# Patient Record
Sex: Male | Born: 1970 | Race: White | Hispanic: No | Marital: Married | Smoking: Former smoker
Health system: Southern US, Community
[De-identification: ages and names within clinical notes are randomized; demographics above are authoritative.]

## PROBLEM LIST (undated history)

## (undated) DIAGNOSIS — E785 Hyperlipidemia, unspecified: Secondary | ICD-10-CM

## (undated) DIAGNOSIS — K8020 Calculus of gallbladder without cholecystitis without obstruction: Secondary | ICD-10-CM

## (undated) DIAGNOSIS — I10 Essential (primary) hypertension: Secondary | ICD-10-CM

## (undated) DIAGNOSIS — E119 Type 2 diabetes mellitus without complications: Secondary | ICD-10-CM

## (undated) DIAGNOSIS — I429 Cardiomyopathy, unspecified: Secondary | ICD-10-CM

## (undated) HISTORY — DX: Hyperlipidemia, unspecified: E78.5

## (undated) HISTORY — DX: Cardiomyopathy, unspecified: I42.9

---

## 1998-01-28 ENCOUNTER — Emergency Department (HOSPITAL_COMMUNITY): Admission: EM | Admit: 1998-01-28 | Discharge: 1998-01-28 | Payer: Self-pay | Admitting: Emergency Medicine

## 1999-10-20 ENCOUNTER — Encounter: Payer: Self-pay | Admitting: Emergency Medicine

## 1999-10-20 ENCOUNTER — Emergency Department (HOSPITAL_COMMUNITY): Admission: EM | Admit: 1999-10-20 | Discharge: 1999-10-20 | Payer: Self-pay | Admitting: Emergency Medicine

## 2011-12-03 ENCOUNTER — Emergency Department (HOSPITAL_COMMUNITY): Payer: BC Managed Care – PPO

## 2011-12-03 ENCOUNTER — Emergency Department (HOSPITAL_COMMUNITY)
Admission: EM | Admit: 2011-12-03 | Discharge: 2011-12-03 | Disposition: A | Discharge status: Home / Self Care | Payer: BC Managed Care – PPO | Attending: Emergency Medicine | Admitting: Emergency Medicine

## 2011-12-03 ENCOUNTER — Encounter (HOSPITAL_COMMUNITY): Payer: Self-pay | Admitting: Adult Health

## 2011-12-03 DIAGNOSIS — F17200 Nicotine dependence, unspecified, uncomplicated: Secondary | ICD-10-CM | POA: Insufficient documentation

## 2011-12-03 DIAGNOSIS — X58XXXA Exposure to other specified factors, initial encounter: Secondary | ICD-10-CM | POA: Insufficient documentation

## 2011-12-03 DIAGNOSIS — I10 Essential (primary) hypertension: Secondary | ICD-10-CM | POA: Insufficient documentation

## 2011-12-03 DIAGNOSIS — Y9364 Activity, baseball: Secondary | ICD-10-CM | POA: Insufficient documentation

## 2011-12-03 DIAGNOSIS — S93609A Unspecified sprain of unspecified foot, initial encounter: Secondary | ICD-10-CM

## 2011-12-03 DIAGNOSIS — Y998 Other external cause status: Secondary | ICD-10-CM | POA: Insufficient documentation

## 2011-12-03 HISTORY — DX: Essential (primary) hypertension: I10

## 2011-12-03 MED ORDER — HYDROCODONE-ACETAMINOPHEN 5-325 MG PO TABS: 1.0000 | ORAL_TABLET | ORAL | Status: AC | PRN

## 2011-12-03 NOTE — ED Notes (Addendum)
C/o left heel pain that began after running from second to third base while playing softball this evening. States, "I heard a pop and then I started limping and my heel starting hurting" CMS intact.  Pain is worse when stepping on foot and is better when foot is off the ground

## 2011-12-03 NOTE — ED Provider Notes (Signed)
History     CSN: [id number]  Arrival date & time 12/03/11  2135   First MD Initiated Contact with Patient 12/03/11 2246      Chief Complaint  Patient presents with  . Foot Pain   HPI  History provided by the patient. Patient is a 41 year old male with history of hypertension who presents with complaints of left foot pain and injury. Patient states he was playing a softball game and was running from second base to third base when he felt a pop in his left foot area. Since that time he has pain in the heel is worse with any pressure or bearing weight. Patient does mention having some chronic pains to bilateral heels. Pains are usually worse in the morning and sometimes improve during the day. He has never had any other significant injuries to the feet. He has not used any medications to treat his symptoms. He denies any swelling of the foot or ankle. He denies any numbness or weakness.    Past Medical History  Diagnosis Date  . Hypertension     History reviewed. No pertinent past surgical history.  History reviewed. No pertinent family history.  History  Substance Use Topics  . Smoking status: Current Everyday Smoker  . Smokeless tobacco: Not on file  . Alcohol Use: Yes      Review of Systems  Musculoskeletal:       Left heel pain  Neurological: Negative for weakness and numbness.    Allergies  Review of patient's allergies indicates no known allergies.  Home Medications   Current Outpatient Rx  Name Route Sig Dispense Refill  . IBUPROFEN 200 MG PO TABS Oral Take 600 mg by mouth daily as needed. For pain      BP 150/98  Pulse 98  Temp 98.7 F (37.1 C) (Oral)  Resp 16  SpO2 96%  Physical Exam  Nursing note and vitals reviewed. Constitutional: He is oriented to person, place, and time. He appears well-developed and well-nourished. No distress.  HENT:  Head: Normocephalic.  Cardiovascular: Normal rate and regular rhythm.   Pulmonary/Chest: Effort normal and  breath sounds normal.  Musculoskeletal:       Left foot: He exhibits tenderness. He exhibits no swelling and no deformity.       Feet:       Tenderness around the left heel. No swelling or deformity. No tenderness over the lateral or medial malleolus. No tenderness over metatarsal. Normal dorsal pedal pulses. Normal cap refill in toes. Normal movement of toes. Normal sensation in toes and foot.  Neurological: He is alert and oriented to person, place, and time.  Skin: Skin is warm and dry. No erythema.  Psychiatric: He has a normal mood and affect. His behavior is normal.    ED Course  Procedures   Dg Foot Complete Left  12/03/2011  *RADIOLOGY REPORT*  Clinical Data: Left foot pain status post twisting injury  LEFT FOOT - COMPLETE 3+ VIEW  Comparison: None.  Findings: Intact Lisfranc joint.  No acute fracture dislocation identified.  Plantar calcaneal enthesopathic change  IMPRESSION: No acute osseous abnormality left foot.  If clinical concern for a fracture persists, recommend a repeat radiograph in 5-10 days to evaluate for interval change or callus formation.   Original Report Authenticated By: Waneta Martins, M.D.      1. Foot sprain       MDM  10:30PM patient seen and evaluated. X-rays without any concerning findings.  Patient initially with slight  tachycardia and hypertension. This has improved.  Patient is advised of x-ray findings. We'll provide crutches and ASO. Patient advised to have a repeat evaluation of his foot in 5-10 days.  Patient given prescription for Hebert [place], [place] 12/03/11 2336

## 2011-12-04 NOTE — ED Provider Notes (Signed)
Medical screening examination/treatment/procedure(s) were performed by non-physician practitioner and as supervising physician I was immediately available for consultation/collaboration.  Jones Skene, M.D.     Jones Skene, MD 12/04/11 [phone]

## 2015-02-09 ENCOUNTER — Emergency Department (INDEPENDENT_AMBULATORY_CARE_PROVIDER_SITE_OTHER)
Admission: EM | Admit: 2015-02-09 | Discharge: 2015-02-09 | Disposition: A | Discharge status: Home / Self Care | Payer: BLUE CROSS/BLUE SHIELD | Source: Home / Self Care | Attending: Internal Medicine | Admitting: Internal Medicine

## 2015-02-09 ENCOUNTER — Encounter (HOSPITAL_COMMUNITY): Payer: Self-pay | Admitting: Emergency Medicine

## 2015-02-09 DIAGNOSIS — T162XXA Foreign body in left ear, initial encounter: Secondary | ICD-10-CM | POA: Diagnosis not present

## 2015-02-09 NOTE — ED Provider Notes (Signed)
CSN: [id number]     Arrival date & time 02/09/15  1937 History   First MD Initiated Contact with Patient 02/09/15 2041     Chief Complaint  Patient presents with  . Foreign Body in Ear   (Consider location/radiation/quality/duration/timing/severity/associated sxs/prior Treatment) HPI Comments: 44 year old male that had a loosening device in his left ear this afternoon and when he removed it a portion of the device remained in the left ear canal. He has no complaints of hearing or pain.   Past Medical History  Diagnosis Date  . Hypertension    History reviewed. No pertinent past surgical history. No family history on file. Social History  Substance Use Topics  . Smoking status: Current Every Day Smoker  . Smokeless tobacco: None  . Alcohol Use: Yes    Review of Systems  Constitutional: Negative.   HENT: Negative.  Negative for ear discharge and ear pain.   Eyes: Negative.   All other systems reviewed and are negative.   Allergies  Review of patient's allergies indicates no known allergies.  Home Medications   Prior to Admission medications   Medication Sig Start Date End Date Taking? Authorizing Provider  ibuprofen (ADVIL,MOTRIN) 200 MG tablet Take 600 mg by mouth daily as needed. For pain    Historical Provider, MD   Meds Ordered and Administered this Visit  Medications - No data to display  BP 174/109 mmHg  Pulse 97  Temp(Src) 97.5 F (36.4 C) (Oral)  Resp 18  SpO2 100% No data found.   Physical Exam  Constitutional: He appears well-developed and well-nourished. No distress.  HENT:  Head: Normocephalic and atraumatic.  There is a cylindrical clear plastic foreign body in the left ear canal. Post removal the canal is mildly erythematous but otherwise atraumatic. TM is normal. No bleeding or drainage.   Eyes: EOM are normal.  Neck: Normal range of motion.  Pulmonary/Chest: Effort normal.  Neurological: He is alert. He exhibits normal muscle tone.  Skin:  Skin is warm and dry.  Psychiatric: He has a normal mood and affect.  Nursing note and vitals reviewed.   ED Course  .Foreign Body Removal Date/Time: 02/09/2015 8:49 PM Performed by: Phineas Real Addalie Calles Authorized by: Eustace Moore Consent: Verbal consent obtained. Risks and benefits: risks, benefits and alternatives were discussed Consent given by: patient Patient understanding: patient states understanding of the procedure being performed Patient identity confirmed: verbally with patient Body area: ear Location details: left ear Patient restrained: no Localization method: ENT speculum Removal mechanism: alligator forceps Complexity: simple 1 objects recovered. Objects recovered: cylindrical plastic preformed object Post-procedure assessment: foreign body removed   (including critical care time)  Labs Review Labs Reviewed - No data to display  Imaging Review No results found.   Visual Acuity Review  Right Eye Distance:   Left Eye Distance:   Bilateral Distance:    Right Eye Near:   Left Eye Near:    Bilateral Near:         MDM   1. FB ear, left, initial encounter    Foreign body was easily located and removed with alligator forceps. No apparent trauma. No additional instructions for any problems may return.    Hayden Rasmussen, NP 02/09/15 2053

## 2015-02-09 NOTE — Discharge Instructions (Signed)
Ear Foreign Body  An ear foreign body is an object that is stuck in your ear. Objects in your ear can cause:  · Pain.  · Buzzing or roaring sounds.  · Hearing loss.  · Fluid coming from your ear (drainage) or bleeding.  · Feeling sick to your stomach (nausea) or throwing up (vomiting).  · A feeling that your ear is full.  HOME CARE  · Keep all follow-up visits as told by your doctor. This is important.  · Take medicines only as told by your doctor.  · If you were prescribed an antibiotic medicine, finish it all even if you start to feel better.  GET HELP IF:  · You have a headache.  · Your have blood coming from your ear.  · You have a fever.  · You have increased pain or swelling of your ear.  · Your hearing is reduced.  · You have discharge coming from your ear.     This information is not intended to replace advice given to you by your health care provider. Make sure you discuss any questions you have with your health care provider.     Document Released: 09/10/2009 Document Revised: 04/13/2014 Document Reviewed: 11/06/2013  Elsevier Interactive Patient Education ©2016 [employer]

## 2015-02-09 NOTE — ED Notes (Signed)
Pt reports rubber earbud lodged in left ear onset 1915 today A&O x4... No acute distress.

## 2015-10-04 ENCOUNTER — Encounter (HOSPITAL_COMMUNITY): Payer: Self-pay

## 2015-10-04 ENCOUNTER — Emergency Department (HOSPITAL_COMMUNITY)
Admission: EM | Admit: 2015-10-04 | Discharge: 2015-10-04 | Disposition: A | Discharge status: Home / Self Care | Payer: Worker's Compensation | Attending: Emergency Medicine | Admitting: Emergency Medicine

## 2015-10-04 DIAGNOSIS — S51811A Laceration without foreign body of right forearm, initial encounter: Secondary | ICD-10-CM | POA: Insufficient documentation

## 2015-10-04 DIAGNOSIS — IMO0002 Reserved for concepts with insufficient information to code with codable children: Secondary | ICD-10-CM

## 2015-10-04 DIAGNOSIS — S59911A Unspecified injury of right forearm, initial encounter: Secondary | ICD-10-CM | POA: Diagnosis present

## 2015-10-04 DIAGNOSIS — I10 Essential (primary) hypertension: Secondary | ICD-10-CM | POA: Insufficient documentation

## 2015-10-04 DIAGNOSIS — F17200 Nicotine dependence, unspecified, uncomplicated: Secondary | ICD-10-CM | POA: Insufficient documentation

## 2015-10-04 DIAGNOSIS — Y999 Unspecified external cause status: Secondary | ICD-10-CM | POA: Diagnosis not present

## 2015-10-04 DIAGNOSIS — W208XXA Other cause of strike by thrown, projected or falling object, initial encounter: Secondary | ICD-10-CM | POA: Diagnosis not present

## 2015-10-04 DIAGNOSIS — Y929 Unspecified place or not applicable: Secondary | ICD-10-CM | POA: Diagnosis not present

## 2015-10-04 DIAGNOSIS — Y939 Activity, unspecified: Secondary | ICD-10-CM | POA: Insufficient documentation

## 2015-10-04 HISTORY — DX: Type 2 diabetes mellitus without complications: E11.9

## 2015-10-04 MED ORDER — LIDOCAINE-EPINEPHRINE (PF) 2 %-1:200000 IJ SOLN
20.0000 mL | Freq: Once | INTRAMUSCULAR | Status: AC
  Administered 2015-10-04: 20 mL
  Filled 2015-10-04: qty 20

## 2015-10-04 MED ORDER — HYDROCODONE-ACETAMINOPHEN 5-325 MG PO TABS: 1.0000 | ORAL_TABLET | Freq: Four times a day (QID) | ORAL | Status: DC | PRN

## 2015-10-04 MED ORDER — TETANUS-DIPHTH-ACELL PERTUSSIS 5-2.5-18.5 LF-MCG/0.5 IM SUSP
0.5000 mL | Freq: Once | INTRAMUSCULAR | Status: AC
  Administered 2015-10-04: 0.5 mL via INTRAMUSCULAR
  Filled 2015-10-04: qty 0.5

## 2015-10-04 MED ORDER — BACITRACIN ZINC 500 UNIT/GM EX OINT
1.0000 "application " | TOPICAL_OINTMENT | Freq: Two times a day (BID) | CUTANEOUS | Status: DC
  Administered 2015-10-04: 1 via TOPICAL

## 2015-10-04 NOTE — ED Notes (Signed)
Pt. Coming from work via [employer] for arm injury today. Pt. Moving metal shelf and it fell on his right forearm. EMS notes a 5 inch by 1 inch laceration with adipose tissue exposed. Pt. Bleeding controlled with pressure bandage. Pt. Has good radial pulse and sensation. Pt. Has no complaints at this time. Pt. HR 104, BP 119/89 and RR 16.

## 2015-10-04 NOTE — Discharge Instructions (Signed)
Please keep your wound clean and dry.    Running clean water is ok after 24-48 hours.  Please follow-up with your doctor in 10 days for staple removal.  You had 15 staples and 3 subcutaneous absorbable sutures placed.  Laceration Care, Adult A laceration is a cut that goes through all of the layers of the skin and into the tissue that is right under the skin. Some lacerations heal on their own. Others need to be closed with stitches (sutures), staples, skin adhesive strips, or skin glue. Proper laceration care minimizes the risk of infection and helps the laceration to heal better. HOW TO CARE FOR YOUR LACERATION If sutures or staples were used:  Keep the wound clean and dry.  If you were given a bandage (dressing), you should change it at least one time per day or as told by your health care provider. You should also change it if it becomes wet or dirty.  Keep the wound completely dry for the first 24 hours or as told by your health care provider. After that time, you may shower or bathe. However, make sure that the wound is not soaked in water until after the sutures or staples have been removed.  Clean the wound one time each day or as told by your health care provider:  Wash the wound with soap and water.  Rinse the wound with water to remove all soap.  Pat the wound dry with a clean towel. Do not rub the wound.  After cleaning the wound, apply a thin layer of antibiotic ointmentas told by your health care provider. This will help to prevent infection and keep the dressing from sticking to the wound.  Have the sutures or staples removed as told by your health care provider. If skin adhesive strips were used:  Keep the wound clean and dry.  If you were given a bandage (dressing), you should change it at least one time per day or as told by your health care provider. You should also change it if it becomes dirty or wet.  Do not get the skin adhesive strips wet. You may shower  or bathe, but be careful to keep the wound dry.  If the wound gets wet, pat it dry with a clean towel. Do not rub the wound.  Skin adhesive strips fall off on their own. You may trim the strips as the wound heals. Do not remove skin adhesive strips that are still stuck to the wound. They will fall off in time. If skin glue was used:  Try to keep the wound dry, but you may briefly wet it in the shower or bath. Do not soak the wound in water, such as by swimming.  After you have showered or bathed, gently pat the wound dry with a clean towel. Do not rub the wound.  Do not do any activities that will make you sweat heavily until the skin glue has fallen off on its own.  Do not apply liquid, cream, or ointment medicine to the wound while the skin glue is in place. Using those may loosen the film before the wound has healed.  If you were given a bandage (dressing), you should change it at least one time per day or as told by your health care provider. You should also change it if it becomes dirty or wet.  If a dressing is placed over the wound, be careful not to apply tape directly over the skin glue. Doing that may cause  the glue to be pulled off before the wound has healed.  Do not pick at the glue. The skin glue usually remains in place for 5-10 days, then it falls off of the skin. General Instructions  Take over-the-counter and prescription medicines only as told by your health care provider.  If you were prescribed an antibiotic medicine or ointment, take or apply it as told by your doctor. Do not stop using it even if your condition improves.  To help prevent scarring, make sure to cover your wound with sunscreen whenever you are outside after stitches are removed, after adhesive strips are removed, or when glue remains in place and the wound is healed. Make sure to wear a sunscreen of at least 30 SPF.  Do not scratch or pick at the wound.  Keep all follow-up visits as told by your  health care provider. This is important.  Check your wound every day for signs of infection. Watch for:  Redness, swelling, or pain.  Fluid, blood, or pus.  Raise (elevate) the injured area above the level of your heart while you are sitting or lying down, if possible. SEEK MEDICAL CARE IF:  You received a tetanus shot and you have swelling, severe pain, redness, or bleeding at the injection site.  You have a fever.  A wound that was closed breaks open.  You notice a bad smell coming from your wound or your dressing.  You notice something coming out of the wound, such as wood or glass.  Your pain is not controlled with medicine.  You have increased redness, swelling, or pain at the site of your wound.  You have fluid, blood, or pus coming from your wound.  You notice a change in the color of your skin near your wound.  You need to change the dressing frequently due to fluid, blood, or pus draining from the wound.  You develop a new rash.  You develop numbness around the wound. SEEK IMMEDIATE MEDICAL CARE IF:  You develop severe swelling around the wound.  Your pain suddenly increases and is severe.  You develop painful lumps near the wound or on skin that is anywhere on your body.  You have a red streak going away from your wound.  The wound is on your hand or foot and you cannot properly move a finger or toe.  The wound is on your hand or foot and you notice that your fingers or toes look pale or bluish.   This information is not intended to replace advice given to you by your health care provider. Make sure you discuss any questions you have with your health care provider.   Document Released: 03/23/2005 Document Revised: 08/07/2014 Document Reviewed: 03/19/2014 Elsevier Interactive Patient Education [employer]

## 2015-10-04 NOTE — ED Notes (Signed)
Laceration at r writ approx 4 inches by 1 in at widest. No bleeding noted.

## 2015-10-04 NOTE — ED Notes (Signed)
Pt states he understands instructions. Home stable with wife. 

## 2015-10-04 NOTE — ED Provider Notes (Signed)
CSN: [id number]     Arrival date & time 10/04/15  1524 History  By signing my name below, I, Marisue Humble, attest that this documentation has been prepared under the direction and in the presence of non-physician practitioner, Roxy Horseman, PA-C. Electronically Signed: Marisue Humble, Scribe. 10/04/2015. 3:50 PM.   Chief Complaint  Patient presents with  . Extremity Laceration    The history is provided by the patient. No language interpreter was used.   HPI Comments:  RHYLAND DEKAY is a 45 y.o. male with PMHx of HTN who presents to the Emergency Department via EMS complaining of 5/10 painful laceration to inner right forearm. Pt was moving a metal shelf earlier today and it fell on his right forearm causing the wound. He applied pressure to the wound immediately and denies any blood spraying from the area. Unsure of last Tetanus. No other complaints at this time.   Past Medical History  Diagnosis Date  . Hypertension    No past surgical history on file. History reviewed. No pertinent family history. Social History  Substance Use Topics  . Smoking status: Current Every Day Smoker  . Smokeless tobacco: None  . Alcohol Use: Yes    Review of Systems  Skin: Positive for wound.  Neurological: Negative for weakness and numbness.    Allergies  Review of patient's allergies indicates no known allergies.  Home Medications   Prior to Admission medications   Medication Sig Start Date End Date Taking? Authorizing Provider  ibuprofen (ADVIL,MOTRIN) 200 MG tablet Take 600 mg by mouth daily as needed. For pain    Historical Provider, MD   BP 112/62 mmHg  Pulse 100  Temp(Src) 98.5 F (36.9 C) (Oral)  Resp 18  SpO2 100%   Physical Exam  Constitutional: He appears well-developed and well-nourished. No distress.  HENT:  Head: Normocephalic and atraumatic.  Eyes: Right eye exhibits no discharge. Left eye exhibits no discharge.  Cardiovascular:  Intact distal pulses  with brisk capillary refill  Pulmonary/Chest: Effort normal. No respiratory distress.  Musculoskeletal:  ROM and strength of right wrist and fingers is 5/5; no evidence of flexor tendon injury  Neurological: He is alert. Coordination normal.  Distal sensation intact  Skin: No rash noted. He is not diaphoretic.  12 cm laceration to the right anterior distal forearm; bleeding is controlled; no foreign body  Psychiatric: He has a normal mood and affect. His behavior is normal.  Nursing note and vitals reviewed.   ED Course  Procedures  DIAGNOSTIC STUDIES:  Oxygen Saturation is 100% on RA, normal by my interpretation.    COORDINATION OF CARE:  3:49 PM Will update Tetanus. Discussed treatment plan with pt at bedside and pt agreed to plan.   LACERATION REPAIR Performed by: Roxy Horseman Authorized by: Roxy Horseman Consent: Verbal consent obtained. Risks and benefits: risks, benefits and alternatives were discussed Consent given by: patient Patient identity confirmed: provided demographic data Prepped and Draped in normal sterile fashion Wound explored  Laceration Location: Right forearm  Laceration Length: 12 cm  No Foreign Bodies seen or palpated  Anesthesia: local infiltration  Local anesthetic: lidocaine 2 % with epinephrine  Anesthetic total: 8 ml  Irrigation method: syringe Amount of cleaning: standard  Skin closure: Staples   Number of sutures: 15   Technique: Staples   Patient tolerance: Patient tolerated the procedure well with no immediate complications.  LACERATION REPAIR Performed by: Roxy Horseman Authorized by: Roxy Horseman Consent: Verbal consent obtained. Risks and benefits: risks, benefits  and alternatives were discussed Consent given by: patient Patient identity confirmed: provided demographic data Prepped and Draped in normal sterile fashion Wound explored  Laceration Location: Right distal forearm  Laceration Length: 12  cm  No Foreign Bodies seen or palpated  Anesthesia: local infiltration  Local anesthetic: lidocaine 2 % with epinephrine  Anesthetic total: 8 ml  Irrigation method: syringe Amount of cleaning: standard  Skin closure: 3-0 Vicryl   Number of sutures: 3   Technique: Subcutaneous interrupted   Patient tolerance: Patient tolerated the procedure well with no immediate complications.   MDM   Final diagnoses:  Laceration    Laceration repaired in the emergency department. Tetanus Shot updated. Wound care and return precautions given. Patient understands agrees the plan. He is stable and ready for discharge.  I personally performed the services described in this documentation, which was scribed in my presence. The recorded information has been reviewed and is accurate.      Roxy Horseman, PA-C 10/04/15 1708  Derwood Kaplan, MD 10/05/15 [phone]

## 2015-10-21 ENCOUNTER — Other Ambulatory Visit: Payer: Self-pay | Admitting: Internal Medicine

## 2015-10-21 DIAGNOSIS — R109 Unspecified abdominal pain: Secondary | ICD-10-CM

## 2015-10-23 ENCOUNTER — Ambulatory Visit
Admission: RE | Admit: 2015-10-23 | Discharge: 2015-10-23 | Disposition: A | Discharge status: Home / Self Care | Payer: BLUE CROSS/BLUE SHIELD | Source: Ambulatory Visit | Attending: Internal Medicine | Admitting: Internal Medicine

## 2015-10-23 DIAGNOSIS — R109 Unspecified abdominal pain: Secondary | ICD-10-CM

## 2015-10-28 ENCOUNTER — Other Ambulatory Visit: Payer: Self-pay | Admitting: Internal Medicine

## 2015-10-28 DIAGNOSIS — N2889 Other specified disorders of kidney and ureter: Secondary | ICD-10-CM

## 2015-11-06 ENCOUNTER — Ambulatory Visit
Admission: RE | Admit: 2015-11-06 | Discharge: 2015-11-06 | Disposition: A | Discharge status: Home / Self Care | Payer: BLUE CROSS/BLUE SHIELD | Source: Ambulatory Visit | Attending: Internal Medicine | Admitting: Internal Medicine

## 2015-11-06 DIAGNOSIS — N2889 Other specified disorders of kidney and ureter: Secondary | ICD-10-CM

## 2015-11-06 MED ORDER — GADOBENATE DIMEGLUMINE 529 MG/ML IV SOLN
20.0000 mL | Freq: Once | INTRAVENOUS | Status: AC | PRN
  Administered 2015-11-06: 20 mL via INTRAVENOUS

## 2016-04-06 HISTORY — PX: CHOLECYSTECTOMY: SHX55

## 2016-06-12 ENCOUNTER — Other Ambulatory Visit: Payer: Self-pay | Admitting: Internal Medicine

## 2016-06-12 DIAGNOSIS — K8020 Calculus of gallbladder without cholecystitis without obstruction: Secondary | ICD-10-CM

## 2016-06-19 ENCOUNTER — Ambulatory Visit
Admission: RE | Admit: 2016-06-19 | Discharge: 2016-06-19 | Disposition: A | Discharge status: Home / Self Care | Payer: BLUE CROSS/BLUE SHIELD | Source: Ambulatory Visit | Attending: Internal Medicine | Admitting: Internal Medicine

## 2016-06-19 DIAGNOSIS — K8020 Calculus of gallbladder without cholecystitis without obstruction: Secondary | ICD-10-CM

## 2016-09-16 ENCOUNTER — Inpatient Hospital Stay (HOSPITAL_COMMUNITY)
Admission: EM | Admit: 2016-09-16 | Discharge: 2016-09-20 | DRG: 417 | Disposition: A | Discharge status: Home / Self Care | Payer: BLUE CROSS/BLUE SHIELD | Attending: Internal Medicine | Admitting: Internal Medicine

## 2016-09-16 ENCOUNTER — Encounter (HOSPITAL_COMMUNITY): Payer: Self-pay | Admitting: *Deleted

## 2016-09-16 ENCOUNTER — Emergency Department (HOSPITAL_COMMUNITY): Payer: BLUE CROSS/BLUE SHIELD

## 2016-09-16 DIAGNOSIS — Z7984 Long term (current) use of oral hypoglycemic drugs: Secondary | ICD-10-CM

## 2016-09-16 DIAGNOSIS — Z8249 Family history of ischemic heart disease and other diseases of the circulatory system: Secondary | ICD-10-CM

## 2016-09-16 DIAGNOSIS — K8050 Calculus of bile duct without cholangitis or cholecystitis without obstruction: Secondary | ICD-10-CM

## 2016-09-16 DIAGNOSIS — R1011 Right upper quadrant pain: Secondary | ICD-10-CM | POA: Diagnosis not present

## 2016-09-16 DIAGNOSIS — K8063 Calculus of gallbladder and bile duct with acute cholecystitis with obstruction: Principal | ICD-10-CM | POA: Diagnosis present

## 2016-09-16 DIAGNOSIS — E119 Type 2 diabetes mellitus without complications: Secondary | ICD-10-CM | POA: Diagnosis present

## 2016-09-16 DIAGNOSIS — Z7982 Long term (current) use of aspirin: Secondary | ICD-10-CM

## 2016-09-16 DIAGNOSIS — K760 Fatty (change of) liver, not elsewhere classified: Secondary | ICD-10-CM | POA: Diagnosis present

## 2016-09-16 DIAGNOSIS — R7989 Other specified abnormal findings of blood chemistry: Secondary | ICD-10-CM | POA: Diagnosis present

## 2016-09-16 DIAGNOSIS — K859 Acute pancreatitis without necrosis or infection, unspecified: Secondary | ICD-10-CM | POA: Diagnosis not present

## 2016-09-16 DIAGNOSIS — K8020 Calculus of gallbladder without cholecystitis without obstruction: Secondary | ICD-10-CM | POA: Diagnosis present

## 2016-09-16 DIAGNOSIS — I10 Essential (primary) hypertension: Secondary | ICD-10-CM | POA: Diagnosis present

## 2016-09-16 DIAGNOSIS — F17200 Nicotine dependence, unspecified, uncomplicated: Secondary | ICD-10-CM | POA: Diagnosis present

## 2016-09-16 DIAGNOSIS — R945 Abnormal results of liver function studies: Secondary | ICD-10-CM

## 2016-09-16 HISTORY — DX: Other specified abnormal findings of blood chemistry: R79.89

## 2016-09-16 HISTORY — DX: Calculus of gallbladder without cholecystitis without obstruction: K80.20

## 2016-09-16 HISTORY — DX: Calculus of bile duct without cholangitis or cholecystitis without obstruction: K80.50

## 2016-09-16 LAB — URINALYSIS, ROUTINE W REFLEX MICROSCOPIC
Bilirubin Urine: NEGATIVE
GLUCOSE, UA: NEGATIVE mg/dL
HGB URINE DIPSTICK: NEGATIVE
Ketones, ur: NEGATIVE mg/dL
Leukocytes, UA: NEGATIVE
Nitrite: NEGATIVE
PH: 6 (ref 5.0–8.0)
Protein, ur: NEGATIVE mg/dL
SPECIFIC GRAVITY, URINE: 1.011 (ref 1.005–1.030)

## 2016-09-16 LAB — COMPREHENSIVE METABOLIC PANEL
ALT: 460 U/L — AB (ref 17–63)
ANION GAP: 11 (ref 5–15)
AST: 231 U/L — ABNORMAL HIGH (ref 15–41)
Albumin: 4.4 g/dL (ref 3.5–5.0)
Alkaline Phosphatase: 184 U/L — ABNORMAL HIGH (ref 38–126)
BUN: 7 mg/dL (ref 6–20)
CHLORIDE: 101 mmol/L (ref 101–111)
CO2: 25 mmol/L (ref 22–32)
CREATININE: 1.28 mg/dL — AB (ref 0.61–1.24)
Calcium: 9.8 mg/dL (ref 8.9–10.3)
GFR calc non Af Amer: >60 mL/min (ref >60)
Glucose, Bld: 109 mg/dL — ABNORMAL HIGH (ref 65–99)
POTASSIUM: 3.7 mmol/L (ref 3.5–5.1)
SODIUM: 137 mmol/L (ref 135–145)
Total Bilirubin: 4.3 mg/dL — ABNORMAL HIGH (ref 0.3–1.2)
Total Protein: 7.8 g/dL (ref 6.5–8.1)

## 2016-09-16 LAB — CBC
HEMATOCRIT: 45.5 % (ref 39.0–52.0)
HEMOGLOBIN: 15.4 g/dL (ref 13.0–17.0)
MCH: 30.1 pg (ref 26.0–34.0)
MCHC: 33.8 g/dL (ref 30.0–36.0)
MCV: 89 fL (ref 78.0–100.0)
Platelets: 296 10*3/uL (ref 150–400)
RBC: 5.11 MIL/uL (ref 4.22–5.81)
RDW: 13.3 % (ref 11.5–15.5)
WBC: 8.8 10*3/uL (ref 4.0–10.5)

## 2016-09-16 LAB — LIPASE, BLOOD: LIPASE: 25 U/L (ref 11–51)

## 2016-09-16 MED ORDER — MORPHINE SULFATE (PF) 4 MG/ML IV SOLN
4.0000 mg | Freq: Once | INTRAVENOUS | Status: AC
Start: 2016-09-16 — End: 2016-09-16
  Administered 2016-09-16: 4 mg via INTRAVENOUS
  Filled 2016-09-16: qty 1

## 2016-09-16 MED ORDER — ONDANSETRON HCL 4 MG/2ML IJ SOLN
4.0000 mg | Freq: Once | INTRAMUSCULAR | Status: AC
  Administered 2016-09-16: 4 mg via INTRAVENOUS
  Filled 2016-09-16: qty 2

## 2016-09-16 MED ORDER — SODIUM CHLORIDE 0.9 % IV BOLUS (SEPSIS)
1000.0000 mL | Freq: Once | INTRAVENOUS | Status: AC
  Administered 2016-09-16: 1000 mL via INTRAVENOUS

## 2016-09-16 MED ORDER — MORPHINE SULFATE (PF) 4 MG/ML IV SOLN
4.0000 mg | Freq: Once | INTRAVENOUS | Status: AC
  Administered 2016-09-16: 4 mg via INTRAVENOUS
  Filled 2016-09-16: qty 1

## 2016-09-16 NOTE — H&P (Signed)
History and Physical  Patient Name: Brandon Hogan     FGB:[id number]    DOB: 1970/07/16    DOA: 09/16/2016 PCP: Pearson Grippe, MD  Patient coming from: Home  Chief Complaint: RUQ pain      HPI: Brandon Hogan is a 46 y.o. male with a past medical history significant for NIDDM and HTN who presents with abdominal pain.  The patient was in his usual state of health until about the last three years when he has had episodes of RUQ pain and nausea lasting about 24 hours about every six months or so.  Last fall, he actually had a RUQ [place] that showed gall stones and and MRCP that showed nothing of concern and so his PCP started him on Actigall, which he has been taking since then.  Now, in the last three days, he had onset of pain, this time without resolution.  The pain is severe, RUQ abdomen, sharp, waxing and waning, associated with nausea and vomiting, worsened with food.  Because it lasted longer than usual this time, and was not relieved with acetaminophen or Naproxen, he came to the ER.  ED course: -Afebrile, heart rate 101, respirations and pulse ox normal, BP 146/111 -Na 137, K 3.7, Cr 1.28 (baseline unknown), WBC 8.8K, Hgb 15.4 -AST/ALT 231/460 respectively, TBili 4.3 -Lipase normal -RUQ US showed 0.6 mm CBD, many stones in gallbladder -Case was discussed with General Surgery who recommended admission to medicine, consult to GI -He was given morphine and TRH were asked to evaluate     ROS: Review of Systems  Constitutional: Negative for chills and fever.  Gastrointestinal: Positive for abdominal pain, nausea and vomiting.  All other systems reviewed and are negative.         Past Medical History:  Diagnosis Date  . Diabetes mellitus without complication (HCC)   . Gallstone   . Hypertension     History reviewed. No pertinent surgical history.  Social History: Patient lives with his wife and children.  The patient walks unassisted.  Former smoker.  Sometimes  alcohol, no recent binges.  No history of IVDU at all.  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  No Known Allergies  Family history: family history includes Gallbladder disease in his father; Heart attack in his brother, father, and mother.  Prior to Admission medications   Medication Sig Start Date End Date Taking? Authorizing Provider  allopurinol (ZYLOPRIM) 100 MG tablet Take 100 mg by mouth 2 (two) times daily.   Yes [provider]  aspirin EC 81 MG tablet Take 81 mg by mouth daily.   Yes [provider]  Icosapent Ethyl 1 g CAPS Take 1 capsule by mouth daily.   Yes [provider]  lisinopril-hydrochlorothiazide (PRINZIDE,ZESTORETIC) 10-12.5 MG tablet Take 1 tablet by mouth daily. 09/25/15  Yes [provider]  metFORMIN (GLUCOPHAGE-XR) 500 MG 24 hr tablet Take 500 mg by mouth every morning. 09/16/15  Yes [provider]       Physical Exam: BP 113/71   Pulse 83   Temp 98.8 F (37.1 C) (Oral)   Resp 18   SpO2 100%  General appearance: Well-developed, adult male, alert and in mild distress from pain.   Eyes: Icteric, conjunctiva pink, lids and lashes normal. PERRL.    ENT: No nasal deformity, discharge, epistaxis.  Hearing normal. OP moist without lesions.   Neck: No neck masses.  Trachea midline.  No thyromegaly/tenderness. Lymph: No cervical or supraclavicular lymphadenopathy. Skin:  Warm and dry.  No jaundice.  No suspicious rashes or lesions. Cardiac: RRR, nl S1-S2, no murmurs appreciated.  Capillary refill is brisk.  JVP normal.  No LE edema.  Radial pulses 2+ and symmetric. Respiratory: Normal respiratory rate and rhythm.  CTAB without rales or wheezes. Abdomen: Abdomen soft.  No significant TTP or guarding. No ascites, distension, hepatosplenomegaly.   MSK: No deformities or effusions.  No cyanosis or clubbing. Neuro: Cranial nerves normal.  Sensation intact to light touch. Speech is fluent.  Muscle strength normal.      Psych: Sensorium intact and responding to questions, attention normal.  Behavior appropriate.  Affect normal.  Judgment and insight appear normal.     Labs on Admission:  I have personally reviewed following labs and imaging studies: CBC:  Recent Labs Lab 09/16/16 1854  WBC 8.8  HGB 15.4  HCT 45.5  MCV 89.0  PLT 296   Basic Metabolic Panel:  Recent Labs Lab 09/16/16 1854  NA 137  K 3.7  CL 101  CO2 25  GLUCOSE 109*  BUN 7  CREATININE 1.28*  CALCIUM 9.8   GFR: CrCl cannot be calculated (Unknown ideal weight.).  Liver Function Tests:  Recent Labs Lab 09/16/16 1854  AST 231*  ALT 460*  ALKPHOS 184*  BILITOT 4.3*  PROT 7.8  ALBUMIN 4.4    Recent Labs Lab 09/16/16 1854  LIPASE 25   No results for input(s): AMMONIA in the last 168 hours. Coagulation Profile: No results for input(s): INR, PROTIME in the last 168 hours. Cardiac Enzymes: No results for input(s): CKTOTAL, CKMB, CKMBINDEX, TROPONINI in the last 168 hours. BNP (last 3 results) No results for input(s): PROBNP in the last 8760 hours. HbA1C: No results for input(s): HGBA1C in the last 72 hours. CBG: No results for input(s): GLUCAP in the last 168 hours. Lipid Profile: No results for input(s): CHOL, HDL, LDLCALC, TRIG, CHOLHDL, LDLDIRECT in the last 72 hours. Thyroid Function Tests: No results for input(s): TSH, T4TOTAL, FREET4, T3FREE, THYROIDAB in the last 72 hours. Anemia Panel: No results for input(s): VITAMINB12, FOLATE, FERRITIN, TIBC, IRON, RETICCTPCT in the last 72 hours. Sepsis Labs: Invalid input(s): PROCALCITONIN, LACTICIDVEN No results found for this or any previous visit (from the past 240 hour(s)).       Radiological Exams on Admission: Personally reviewed [place] report: US Abdomen Limited Ruq  Result Date: 09/16/2016 CLINICAL DATA:  Right upper quadrant abdominal pain. Diabetes. History of gallstones. Nausea and vomiting. EXAM: ULTRASOUND ABDOMEN LIMITED RIGHT UPPER QUADRANT  COMPARISON:  06/19/2016 FINDINGS: Gallbladder: Shadowing gallstones measuring up to 3.1 cm in diameter. Sludge observed in the gallbladder. Gallbladder wall thickness up to 4 mm. Sonographic Murphy's sign absent. Common bile duct: Diameter: 6 mm Liver: No focal lesion identified. Coarse echogenic liver with poor sonic penetration compatible with diffuse hepatic steatosis. IMPRESSION: 1. Cholelithiasis with gallbladder wall thickening. No pericholecystic fluid or sonographic Murphy sign. Correlate clinically in assessing for acute cholecystitis. 2. Coarse echogenic liver with poor sonic penetration compatible with diffuse hepatic steatosis. 3. Mild CBD dilatation and 6 mm, without directly visualized choledocholithiasis. Electronically Signed   By: Gaylyn Rong M.D.   On: 09/16/2016 21:22        Assessment/Plan  1. Suspected choledocholithiasis:    -Will obtain hepatitis serologies and HIV to rule out viral hepatitis -MRCP per Gen Surg -Consult to General Surgery -Consult to GI per General Surgery re: need for ERCP -Trend CMP -IVF and clears only -Dilaudid IV, oxycodone for pain, ondansetron  for nausea  2. Diabetes:  Well controlled on metformin. -Hold metformin -SSI if needed  3. Hypertension:  Slightly hypertensive on admission -Continue lisinopril-HCTZ      DVT prophylaxis: SCDs  Code Status: FULL  Family Communication: Wife at bedside  Disposition Plan: Anticipate MRCP and consult to General Surgery Consults called: General Surgery, GI Admission status: OBS At the point of initial evaluation, it is my clinical opinion that admission for OBSERVATION is reasonable and necessary because the patient's presenting complaints in the context of their chronic conditions represent sufficient risk of deterioration or significant morbidity to constitute reasonable grounds for close observation in the hospital setting, but that the patient may be medically stable for discharge from  the hospital within 24 to 48 hours.    Medical decision making: Patient seen at 10:35 PM on 09/16/2016.  The patient was discussed with Shanna Cisco, PA-C, Dr. Rhea Belton and Dr. Lindie Spruce.  What exists of the patient's chart was reviewed in depth and summarized above.  Clinical condition: stable.        Brandon Hogan [facility] Pager [phone]

## 2016-09-16 NOTE — ED Notes (Signed)
Patient transported to Ultrasound 

## 2016-09-16 NOTE — ED Provider Notes (Signed)
[facility]-EMERGENCY DEPT Provider Note   CSN: [id number] Arrival date & time: 09/16/16  1832  By signing my name below, I, Rosana Fret, attest that this documentation has been prepared under the direction and in the presence of non-physician practitioner, Destani Wamser, Gordy Councilman, PA-C. Electronically Signed: Rosana Fret, ED Scribe. 09/16/16. 7:45 PM.  History   Chief Complaint Chief Complaint  Patient presents with  . Abdominal Pain   The history is provided by the patient. No language interpreter was used.   HPI Comments: Brandon Hogan is a 46 y.o. male with history of gallstones who presents to the Emergency Department complaining of gradually worsening, constant RUQ abdominal pain onset 3 days ago. Pt states he was diagnosed with gallstones and was given medications with transient relief. Pt describes pain as radiating from his right side to his back. Pt states pain is exacerbated after eating, but present all the time. Pt reports associated nausea and vomiting. Patient reports that he has had episodes in the past that lasted about a day, but resolved on the round. Pt has tried Tylenol and Ibuprofen with no relief of his pain. Pt denies blood in stool, diarrhea, fever, CP, SOB or any other complaints at this time.  Past Medical History:  Diagnosis Date  . Diabetes mellitus without complication (HCC)   . Gallstone   . Hypertension     There are no active problems to display for this patient.   History reviewed. No pertinent surgical history.     Home Medications    Prior to Admission medications   Medication Sig Start Date End Date Taking? Authorizing Provider  aspirin EC 81 MG tablet Take 81 mg by mouth daily.    [provider]  HYDROcodone-acetaminophen (NORCO/VICODIN) 5-325 MG tablet Take 1-2 tablets by mouth every 6 (six) hours as needed. 10/04/15   Roxy Horseman, PA-C  lisinopril-hydrochlorothiazide (PRINZIDE,ZESTORETIC) 10-12.5 MG tablet Take 1 tablet by  mouth daily. 09/25/15   [provider]  metFORMIN (GLUCOPHAGE-XR) 500 MG 24 hr tablet Take 500 mg by mouth every morning. 09/16/15   [provider]  Omega-3 Fatty Acids (FISH OIL) 1000 MG CAPS Take 1,000 mg by mouth daily.    [provider]    Family History History reviewed. No pertinent family history.  Social History Social History  Substance Use Topics  . Smoking status: Current Every Day Smoker  . Smokeless tobacco: Not on file  . Alcohol use Yes     Allergies   Patient has no known allergies.   Review of Systems Review of Systems  Constitutional: Negative for fever.  Respiratory: Negative for shortness of breath.   Cardiovascular: Negative for chest pain.  Gastrointestinal: Positive for abdominal pain, nausea and vomiting. Negative for blood in stool.     Physical Exam Updated Vital Signs BP (!) 147/111 (BP Location: Right Arm)   Pulse (!) 102   Temp 98.1 F (36.7 C) (Oral)   Resp 18   SpO2 100%   Physical Exam  Constitutional: He appears well-developed and well-nourished. No distress.  HENT:  Head: Normocephalic and atraumatic.  Mouth/Throat: Oropharynx is clear and moist. No oropharyngeal exudate.  Eyes: Conjunctivae are normal. Pupils are equal, round, and reactive to light. Right eye exhibits no discharge. Left eye exhibits no discharge. No scleral icterus.  Neck: Normal range of motion. Neck supple. No thyromegaly present.  Cardiovascular: Regular rhythm, normal heart sounds and intact distal pulses.  Exam reveals no gallop and no friction rub.   No murmur  heard. Pulmonary/Chest: Effort normal and breath sounds normal. No stridor. No respiratory distress. He has no wheezes. He has no rales.  Abdominal: Soft. Bowel sounds are normal. He exhibits no distension. There is tenderness. There is no rebound and no guarding.  Significant RUE tenderness. Positive Murphy's sign.   Musculoskeletal: He exhibits no edema.  Lymphadenopathy:      He has no cervical adenopathy.  Neurological: He is alert. Coordination normal.  Skin: Skin is warm and dry. No rash noted. He is not diaphoretic. No pallor.  Psychiatric: He has a normal mood and affect.  Nursing note and vitals reviewed.    ED Treatments / Results  DIAGNOSTIC STUDIES: Oxygen Saturation is 100% on RA, normal by my interpretation.   COORDINATION OF CARE: 7:41 PM-Discussed next steps with pt including IV fluids and [place]. Pt verbalized understanding and is agreeable with the plan.   Labs (all labs ordered are listed, but only abnormal results are displayed) Labs Reviewed  COMPREHENSIVE METABOLIC PANEL - Abnormal; Notable for the following:       Result Value   Glucose, Bld 109 (*)    Creatinine, Ser 1.28 (*)    AST 231 (*)    ALT 460 (*)    Alkaline Phosphatase 184 (*)    Total Bilirubin 4.3 (*)    All other components within normal limits  URINALYSIS, ROUTINE W REFLEX MICROSCOPIC - Abnormal; Notable for the following:    Color, Urine AMBER (*)    All other components within normal limits  LIPASE, BLOOD  CBC    EKG  EKG Interpretation None       Radiology No results found.  Procedures Procedures (including critical care time)  Medications Ordered in ED Medications  morphine 4 MG/ML injection 4 mg (4 mg Intravenous Given 09/16/16 2033)  ondansetron (ZOFRAN) injection 4 mg (4 mg Intravenous Given 09/16/16 2026)  sodium chloride 0.9 % bolus 1,000 mL (1,000 mLs Intravenous New Bag/Given 09/16/16 2026)     Initial Impression / Assessment and Plan / ED Course  I have reviewed the triage vital signs and the nursing notes.  Pertinent labs & imaging results that were available during my care of the patient were reviewed by me and considered in my medical decision making (see chart for details).     CBC unremarkable. CMP shows creatinine 1.28, AST 231, ALT 460, alkaline phosphatase 184, total bilirubin 4.3. Lipase 25. UA is negative. Suspect acute  cholecystitis. Right upper quadrant ultrasound is pending. At shift change, patient care transferred to [facility], PA-C for continued evaluation, follow up of RUQ U/S and determination of disposition. I discussed patient case with Dr. Jeraldine Loots who agrees with plan.   Final Clinical Impressions(s) / ED Diagnoses   Final diagnoses:  RUQ abdominal pain  Right upper quadrant abdominal pain    New Prescriptions New Prescriptions   No medications on file   I personally performed the services described in this documentation, which was scribed in my presence. The recorded information has been reviewed and is accurate.     Emi Holes, PA-C 09/16/16 2054    Gerhard Munch, MD 09/17/16 1233

## 2016-09-16 NOTE — ED Provider Notes (Signed)
Care assumed from previous provider PA Law. Please see note for further details. Case discussed, plan agreed upon. Briefly, patient is a 46 y.o. male who presented to ER for RUQ abdominal pain. Labs notable for elevated LFTs and bump in creatinine. AST 231, AST 460, alkaline phosphatase 184, bili 4.3, creatinine 1.28. He has a normal white count and is afebrile. Right upper quadrant ultrasound pending. Will follow up on imaging and dispensed appropriately.  Ultrasound reviewed with attending, Dr. Jeraldine Loots:  IMPRESSION: 1. Cholelithiasis with gallbladder wall thickening. No pericholecystic fluid or sonographic Murphy sign. Correlate clinically in assessing for acute cholecystitis. 2. Coarse echogenic liver with poor sonic penetration compatible with diffuse hepatic steatosis. 3. Mild CBD dilatation and 6 mm, without directly visualized Choledocholithiasis.  Gen: afebrile, VSS HEENT: Atraumatic, EOMI Resp: no resp distress CV: RRR Abd: Tenderness to right upper quadrant with guarding. Negative murphy's.  MsK: moving all extremities well Neuro: A&O x4  Discussed case with general surgery, Dr. Lindie Spruce who recommends hospitalist admission, GI consultation and he has ordered MRCP. Surgery will see patient, likely in the morning. Hospitalist, Dr. Maryfrances Bunnell consulted. Hospitalist has spoken with GI as well. Patient updated and agreeable to plan.   Patient discussed with Dr. Jeraldine Loots who agrees with treatment plan.     Elton Catalano, Chase Picket, PA-C 09/16/16 2249    Gerhard Munch, MD 09/17/16 1229

## 2016-09-16 NOTE — ED Triage Notes (Signed)
Pt and family reports that pt has been diagnosed with gallstones and given meds with no relief. Pt having severe mid abd pain x 3-4 days and pain now radiates into his back. Had n/v and loose stools. Denies fever.

## 2016-09-16 NOTE — ED Notes (Signed)
Patient transported to ultrasound.

## 2016-09-17 ENCOUNTER — Observation Stay (HOSPITAL_COMMUNITY): Payer: BLUE CROSS/BLUE SHIELD

## 2016-09-17 DIAGNOSIS — K760 Fatty (change of) liver, not elsewhere classified: Secondary | ICD-10-CM | POA: Diagnosis present

## 2016-09-17 DIAGNOSIS — Z8249 Family history of ischemic heart disease and other diseases of the circulatory system: Secondary | ICD-10-CM | POA: Diagnosis not present

## 2016-09-17 DIAGNOSIS — Z7982 Long term (current) use of aspirin: Secondary | ICD-10-CM | POA: Diagnosis not present

## 2016-09-17 DIAGNOSIS — K8063 Calculus of gallbladder and bile duct with acute cholecystitis with obstruction: Secondary | ICD-10-CM | POA: Diagnosis present

## 2016-09-17 DIAGNOSIS — K8051 Calculus of bile duct without cholangitis or cholecystitis with obstruction: Secondary | ICD-10-CM | POA: Diagnosis not present

## 2016-09-17 DIAGNOSIS — K8020 Calculus of gallbladder without cholecystitis without obstruction: Secondary | ICD-10-CM | POA: Diagnosis present

## 2016-09-17 DIAGNOSIS — E119 Type 2 diabetes mellitus without complications: Secondary | ICD-10-CM | POA: Diagnosis present

## 2016-09-17 DIAGNOSIS — R1011 Right upper quadrant pain: Secondary | ICD-10-CM | POA: Diagnosis present

## 2016-09-17 DIAGNOSIS — R7989 Other specified abnormal findings of blood chemistry: Secondary | ICD-10-CM | POA: Diagnosis not present

## 2016-09-17 DIAGNOSIS — K859 Acute pancreatitis without necrosis or infection, unspecified: Secondary | ICD-10-CM | POA: Diagnosis not present

## 2016-09-17 DIAGNOSIS — K8050 Calculus of bile duct without cholangitis or cholecystitis without obstruction: Secondary | ICD-10-CM | POA: Diagnosis not present

## 2016-09-17 DIAGNOSIS — I10 Essential (primary) hypertension: Secondary | ICD-10-CM | POA: Diagnosis present

## 2016-09-17 DIAGNOSIS — F17200 Nicotine dependence, unspecified, uncomplicated: Secondary | ICD-10-CM | POA: Diagnosis present

## 2016-09-17 DIAGNOSIS — Z7984 Long term (current) use of oral hypoglycemic drugs: Secondary | ICD-10-CM | POA: Diagnosis not present

## 2016-09-17 LAB — COMPREHENSIVE METABOLIC PANEL
ALT: 349 U/L — ABNORMAL HIGH (ref 17–63)
AST: 156 U/L — AB (ref 15–41)
Albumin: 3.5 g/dL (ref 3.5–5.0)
Alkaline Phosphatase: 161 U/L — ABNORMAL HIGH (ref 38–126)
Anion gap: 9 (ref 5–15)
BUN: 7 mg/dL (ref 6–20)
CHLORIDE: 104 mmol/L (ref 101–111)
CO2: 24 mmol/L (ref 22–32)
CREATININE: 1.23 mg/dL (ref 0.61–1.24)
Calcium: 9 mg/dL (ref 8.9–10.3)
GFR calc Af Amer: >60 mL/min (ref >60)
GFR calc non Af Amer: >60 mL/min (ref >60)
Glucose, Bld: 159 mg/dL — ABNORMAL HIGH (ref 65–99)
Potassium: 3.3 mmol/L — ABNORMAL LOW (ref 3.5–5.1)
SODIUM: 137 mmol/L (ref 135–145)
Total Bilirubin: 3.6 mg/dL — ABNORMAL HIGH (ref 0.3–1.2)
Total Protein: 6.4 g/dL — ABNORMAL LOW (ref 6.5–8.1)

## 2016-09-17 LAB — GLUCOSE, CAPILLARY
GLUCOSE-CAPILLARY: 110 mg/dL — AB (ref 65–99)
GLUCOSE-CAPILLARY: 103 mg/dL — AB (ref 65–99)
GLUCOSE-CAPILLARY: 125 mg/dL — AB (ref 65–99)
Glucose-Capillary: 101 mg/dL — ABNORMAL HIGH (ref 65–99)
Glucose-Capillary: 104 mg/dL — ABNORMAL HIGH (ref 65–99)

## 2016-09-17 LAB — CBC
HCT: 39 % (ref 39.0–52.0)
Hemoglobin: 13 g/dL (ref 13.0–17.0)
MCH: 29.6 pg (ref 26.0–34.0)
MCHC: 33.3 g/dL (ref 30.0–36.0)
MCV: 88.8 fL (ref 78.0–100.0)
PLATELETS: 244 10*3/uL (ref 150–400)
RBC: 4.39 MIL/uL (ref 4.22–5.81)
RDW: 13.2 % (ref 11.5–15.5)
WBC: 6.5 10*3/uL (ref 4.0–10.5)

## 2016-09-17 LAB — MAGNESIUM: Magnesium: 1.9 mg/dL (ref 1.7–2.4)

## 2016-09-17 LAB — HIV ANTIBODY (ROUTINE TESTING W REFLEX): HIV SCREEN 4TH GENERATION: NONREACTIVE

## 2016-09-17 LAB — PROTIME-INR
INR: 1.15
Prothrombin Time: 14.8 seconds (ref 11.4–15.2)

## 2016-09-17 MED ORDER — SODIUM CHLORIDE 0.9 % IV SOLN
3.0000 g | Freq: Once | INTRAVENOUS | Status: AC
  Administered 2016-09-18: 3 g via INTRAVENOUS
  Filled 2016-09-17: qty 3

## 2016-09-17 MED ORDER — ONDANSETRON HCL 4 MG/2ML IJ SOLN
4.0000 mg | Freq: Four times a day (QID) | INTRAMUSCULAR | Status: DC | PRN
  Administered 2016-09-17 – 2016-09-18 (×4): 4 mg via INTRAVENOUS
  Filled 2016-09-17 (×4): qty 2

## 2016-09-17 MED ORDER — INSULIN ASPART 100 UNIT/ML ~~LOC~~ SOLN: 0.0000 [IU] | Freq: Three times a day (TID) | SUBCUTANEOUS | Status: DC

## 2016-09-17 MED ORDER — DEXTROSE 5 % IV SOLN
2.0000 g | INTRAVENOUS | Status: AC
  Filled 2016-09-17: qty 2

## 2016-09-17 MED ORDER — LISINOPRIL-HYDROCHLOROTHIAZIDE 10-12.5 MG PO TABS: 1.0000 | ORAL_TABLET | Freq: Every day | ORAL | Status: DC

## 2016-09-17 MED ORDER — HYDROCHLOROTHIAZIDE 12.5 MG PO CAPS
12.5000 mg | ORAL_CAPSULE | Freq: Every day | ORAL | Status: DC
  Administered 2016-09-17 – 2016-09-20 (×3): 12.5 mg via ORAL
  Filled 2016-09-17 (×3): qty 1

## 2016-09-17 MED ORDER — ALLOPURINOL 100 MG PO TABS
100.0000 mg | ORAL_TABLET | Freq: Two times a day (BID) | ORAL | Status: DC
  Administered 2016-09-17 – 2016-09-20 (×5): 100 mg via ORAL
  Filled 2016-09-17 (×7): qty 1

## 2016-09-17 MED ORDER — GADOBENATE DIMEGLUMINE 529 MG/ML IV SOLN
19.0000 mL | Freq: Once | INTRAVENOUS | Status: AC
  Administered 2016-09-17: 19 mL via INTRAVENOUS

## 2016-09-17 MED ORDER — LISINOPRIL 10 MG PO TABS
10.0000 mg | ORAL_TABLET | Freq: Every day | ORAL | Status: DC
  Administered 2016-09-17 – 2016-09-20 (×3): 10 mg via ORAL
  Filled 2016-09-17 (×3): qty 1

## 2016-09-17 MED ORDER — OXYCODONE HCL 5 MG PO TABS
5.0000 mg | ORAL_TABLET | ORAL | Status: DC | PRN
  Administered 2016-09-17 (×2): 5 mg via ORAL
  Filled 2016-09-17 (×2): qty 1

## 2016-09-17 MED ORDER — DEXTROSE 5 % IV SOLN
2.0000 g | INTRAVENOUS | Status: DC
  Filled 2016-09-17: qty 2

## 2016-09-17 MED ORDER — INSULIN ASPART 100 UNIT/ML ~~LOC~~ SOLN: 0.0000 [IU] | Freq: Every day | SUBCUTANEOUS | Status: DC

## 2016-09-17 MED ORDER — ONDANSETRON HCL 4 MG PO TABS: 4.0000 mg | ORAL_TABLET | Freq: Four times a day (QID) | ORAL | Status: DC | PRN

## 2016-09-17 MED ORDER — ASPIRIN EC 81 MG PO TBEC
81.0000 mg | DELAYED_RELEASE_TABLET | Freq: Every day | ORAL | Status: DC
  Administered 2016-09-17 – 2016-09-20 (×3): 81 mg via ORAL
  Filled 2016-09-17 (×3): qty 1

## 2016-09-17 MED ORDER — INDOMETHACIN 50 MG RE SUPP
100.0000 mg | Freq: Once | RECTAL | Status: DC
  Filled 2016-09-17: qty 2

## 2016-09-17 MED ORDER — POTASSIUM CHLORIDE CRYS ER 20 MEQ PO TBCR
40.0000 meq | EXTENDED_RELEASE_TABLET | ORAL | Status: AC
  Administered 2016-09-17 (×2): 40 meq via ORAL
  Filled 2016-09-17 (×2): qty 2

## 2016-09-17 MED ORDER — SODIUM CHLORIDE 0.9 % IV SOLN
INTRAVENOUS | Status: DC
  Administered 2016-09-17 – 2016-09-19 (×8): via INTRAVENOUS

## 2016-09-17 NOTE — Care Management Note (Signed)
Case Management Note  Patient Details  Name: Brandon Hogan MRN: [id number] Date of Birth: [birth year]  Subjective/Objective:                    Action/Plan:  From home with wife  Suspected choledocholithiasis:     -MRCP per Gen Surg -Consult to General Surgery -Consult to GI per General Surgery re: need for ERCP  Will continue to follow  Expected Discharge Date:  09/19/16               Expected Discharge Plan:  Home/Self Care  In-House Referral:     Discharge planning Services     Post Acute Care Choice:    Choice offered to:     DME Arranged:    DME Agency:     HH Arranged:    HH Agency:     Status of Service:  In process, will continue to follow  If discussed at Long Length of Stay Meetings, dates discussed:    Additional Comments:  Kingsley Plan, RN 09/17/2016, 10:55 AM

## 2016-09-17 NOTE — Progress Notes (Signed)
PROGRESS NOTE    Brandon Hogan  ZOX:[id number] DOB: [birth year] DOA: 09/16/2016 PCP: Pearson Grippe, MD    Brief Narrative:  Patient is a 46 year old gentleman history of non-insulin-dependent diabetes mellitus, hypertension presenting with three-day history of right upper quadrant abdominal pain associated with nausea vomiting and worsening with food intake. Right upper quadrant ultrasound consistent with symptomatic cholelithiasis. MRCP done consistent with choledocholithiasis. Patient likely required ERCP and cholecystectomy. Gastroenterology and general surgery following.   Assessment & Plan:   Principal Problem:   Choledocholithiasis Active Problems:   Essential hypertension   Controlled type 2 diabetes mellitus without complication, without long-term current use of insulin (HCC)   Elevated LFTs   Symptomatic cholelithiasis  #1 choledocholithiasis/symptomatic cholelithiasis Patient presented with symptomatic cholelithiasis with right upper quadrant pain associated with food intake and noted to have a transaminitis. Right upper quadrant ultrasound consistent with cholelithiasis. Patient currently tolerating clear liquids. LFTs trending down. MRCP with debris or small gallstones filling the distal 3.5 cm of the, bile duct. Gallstones and debris also present in the gallbladder. Mild hepatic steatosis. Patient likely benefit from ERCP and subsequently cholecystectomy. Patient to be nothing by mouth after midnight. GI and general surgery following and appreciate input and recommendations.  #2 transaminitis Secondary to problem #1. Trending down.  #3 hypertension Stable. Continue home regimen of lisinopril HCTZ.  #4 diabetes mellitus type 2 Check a hemoglobin A1c. Hold oral hypoglycemic agents. Sliding scale insulin.  DVT prophylaxis: SCDs Code Status: Full Family Communication: Updated patient and family at bedside. Disposition Plan: Likely home post ERCP and probable  cholecystectomy and per general surgery and GI.   Consultants:   Gen. surgery: Dr. Lindie Spruce 09/17/2016  Gastroenterology pending  Procedures:   MRCP 09/17/2016  Right upper quadrant ultrasound 09/16/2016    Antimicrobials:   None   Subjective: Patient states slight improvement from admission. Still with some right upper quadrant tenderness. An episode of emesis last night none since then. Tolerating clears.  Objective: Vitals:   09/16/16 2230 09/16/16 2330 09/17/16 0005 09/17/16 0528  BP: 113/71 126/81 122/86 119/69  Pulse: 83 80 80 79  Resp: 18  18 18   Temp:   98.4 F (36.9 C) 97.8 F (36.6 C)  TempSrc:   Oral Oral  SpO2: 100% 97% 100% 100%  Weight:   98.3 kg (216 lb 12.8 oz)   Height:   6\' 2"  (1.88 m)     Intake/Output Summary (Last 24 hours) at 09/17/16 1314 Last data filed at 09/17/16 0723  Gross per 24 hour  Intake             1775 ml  Output              475 ml  Net             1300 ml   Filed Weights   09/17/16 0005  Weight: 98.3 kg (216 lb 12.8 oz)    Examination:  General exam: Appears calm and comfortable  Respiratory system: Clear to auscultation. Respiratory effort normal. Cardiovascular system: S1 & S2 heard, RRR. No JVD, murmurs, rubs, gallops or clicks. No pedal edema. Gastrointestinal system: Abdomen is nondistended, soft and tender to palpation right upper quadrant. No organomegaly or masses felt. Normal bowel sounds heard. Central nervous system: Alert and oriented. No focal neurological deficits. Extremities: Symmetric 5 x 5 power. Skin: No rashes, lesions or ulcers Psychiatry: Judgement and insight appear normal. Mood & affect appropriate.     Data Reviewed: I have personally reviewed  following labs and imaging studies  CBC:  Recent Labs Lab 09/16/16 1854 09/17/16 0332  WBC 8.8 6.5  HGB 15.4 13.0  HCT 45.5 39.0  MCV 89.0 88.8  PLT 296 244   Basic Metabolic Panel:  Recent Labs Lab 09/16/16 1854 09/17/16 0332  NA 137  137  K 3.7 3.3*  CL 101 104  CO2 25 24  GLUCOSE 109* 159*  BUN 7 7  CREATININE 1.28* 1.23  CALCIUM 9.8 9.0  MG  --  1.9   GFR: Estimated Creatinine Clearance: 89.1 mL/min (by C-G formula based on SCr of 1.23 mg/dL). Liver Function Tests:  Recent Labs Lab 09/16/16 1854 09/17/16 0332  AST 231* 156*  ALT 460* 349*  ALKPHOS 184* 161*  BILITOT 4.3* 3.6*  PROT 7.8 6.4*  ALBUMIN 4.4 3.5    Recent Labs Lab 09/16/16 1854  LIPASE 25   No results for input(s): AMMONIA in the last 168 hours. Coagulation Profile:  Recent Labs Lab 09/17/16 0332  INR 1.15   Cardiac Enzymes: No results for input(s): CKTOTAL, CKMB, CKMBINDEX, TROPONINI in the last 168 hours. BNP (last 3 results) No results for input(s): PROBNP in the last 8760 hours. HbA1C: No results for input(s): HGBA1C in the last 72 hours. CBG:  Recent Labs Lab 09/17/16 0008 09/17/16 0737 09/17/16 1158  GLUCAP 101* 110* 103*   Lipid Profile: No results for input(s): CHOL, HDL, LDLCALC, TRIG, CHOLHDL, LDLDIRECT in the last 72 hours. Thyroid Function Tests: No results for input(s): TSH, T4TOTAL, FREET4, T3FREE, THYROIDAB in the last 72 hours. Anemia Panel: No results for input(s): VITAMINB12, FOLATE, FERRITIN, TIBC, IRON, RETICCTPCT in the last 72 hours. Sepsis Labs: No results for input(s): PROCALCITON, LATICACIDVEN in the last 168 hours.  No results found for this or any previous visit (from the past 240 hour(s)).       Radiology Studies: US Abdomen Limited Ruq  Result Date: 09/16/2016 CLINICAL DATA:  Right upper quadrant abdominal pain. Diabetes. History of gallstones. Nausea and vomiting. EXAM: ULTRASOUND ABDOMEN LIMITED RIGHT UPPER QUADRANT COMPARISON:  06/19/2016 FINDINGS: Gallbladder: Shadowing gallstones measuring up to 3.1 cm in diameter. Sludge observed in the gallbladder. Gallbladder wall thickness up to 4 mm. Sonographic Murphy's sign absent. Common bile duct: Diameter: 6 mm Liver: No focal lesion  identified. Coarse echogenic liver with poor sonic penetration compatible with diffuse hepatic steatosis. IMPRESSION: 1. Cholelithiasis with gallbladder wall thickening. No pericholecystic fluid or sonographic Murphy sign. Correlate clinically in assessing for acute cholecystitis. 2. Coarse echogenic liver with poor sonic penetration compatible with diffuse hepatic steatosis. 3. Mild CBD dilatation and 6 mm, without directly visualized choledocholithiasis. Electronically Signed   By: Gaylyn Rong M.D.   On: 09/16/2016 21:22        Scheduled Meds: . allopurinol  100 mg Oral BID  . aspirin EC  81 mg Oral Daily  . lisinopril  10 mg Oral Daily   And  . hydrochlorothiazide  12.5 mg Oral Daily  . insulin aspart  0-5 Units Subcutaneous QHS  . insulin aspart  0-9 Units Subcutaneous TID WC  . potassium chloride  40 mEq Oral Q4H   Continuous Infusions: . sodium chloride 100 mL/hr at 09/17/16 1107     LOS: 0 days    Time spent: 35 minutes    Yousra Ivens, MD [facility] Pager [phone]  If 7PM-7AM, please contact night-coverage [email or web address] Password [email or web address] 09/17/2016, 1:14 PM

## 2016-09-17 NOTE — Consult Note (Signed)
[facility] Consult: 3:18 PM 09/17/2016  LOS: 0 days    Referring Provider: Dr Grandville Silos  Primary Care Physician:  Jani Gravel, MD Primary Gastroenterologist:  unassigned    Reason for Consultation:  Choledocholithiasis   HPI: Brandon Hogan is a 46 y.o. male.  PMH type 2 DM.  HTN.  Gout. Episodes of right upper quadrant pain and nausea over 2 or 3 years.   Ultrasound in 10/2015 showed fatty liver and uncomplicated gallstones along with a mass in the left kidney. He had an MRI in 11/2015 but not an MRCP. 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 It also confirmed cholelithiasis and fatty liver. Ultrasound 06/2016 confirmed gallstones as well as a fatty liver, CBD of 4 mm. His PCP initiated the patient on Actigall after he declined surgical evaluation. He's been taking the medication since then. For about 3 days he's had onset of right upper quadrant pain which has not resolved. It is associated with nausea and vomiting, worsened with food. The pain is located in the right upper quadrant and is severe, sharp and absent flows. At times it radiates to the scapula. He tried taking acetaminophen and Naprosyn but it did not help.  So he presented to the ED. T bili 4.3 ..3.6.  Alk phos 184 >> 161.  AST/ALT 231/460>> 156/349.  Abdominal ultrasound shows gallbladder wall thickening, cholelithiasis. Diffuse hepatic steatosis. CBD 6 mm but no visualized choledocholithiasis.  Although his MRCP was performed this morning there was some sort of technical complication with the software and it was finally read at about 3 PM today.  Findings include multiple gallbladder stones, mild. Portal edema at the liver. Small right hepatic cyst which may represent debris or sludge in the gallbladder fundus. CBD measuring up to 8  mm in the distal common bile duct there appears to be debris versus small stones.     Since his admission, the pain has subsided. He last received oxycodone for pain management early this morning and hasn't required any pain medication since then. He hasn't had any recurrent nausea or vomiting. Patient drinks maybe a couple of beers a month if that. Family history of cholecystectomy in his father when he was in his 24 or 55. More recently his father has undergone nephrectomy. Patient not aware of any family history of ulcer disease, colorectal cancer or inflammatory bowel disease.  Past Medical History:  Diagnosis Date  . Diabetes mellitus without complication ([place])   . Gallstone   . Hypertension     History reviewed. No pertinent surgical history.  Prior to Admission medications   Medication Sig Start Date End Date Taking? Authorizing Provider  allopurinol (ZYLOPRIM) 100 MG tablet Take 100 mg by mouth 2 (two) times daily.   Yes [provider]  aspirin EC 81 MG tablet Take 81 mg by mouth daily.   Yes [provider]  Icosapent Ethyl 1 g CAPS Take 1 capsule by mouth daily.   Yes [provider]  lisinopril-hydrochlorothiazide (PRINZIDE,ZESTORETIC) 10-12.5 MG tablet Take 1 tablet by mouth  daily. 09/25/15  Yes [provider]  metFORMIN (GLUCOPHAGE-XR) 500 MG 24 hr tablet Take 500 mg by mouth every morning. 09/16/15  Yes [provider]    Scheduled Meds: . allopurinol  100 mg Oral BID  . aspirin EC  81 mg Oral Daily  . lisinopril  10 mg Oral Daily   And  . hydrochlorothiazide  12.5 mg Oral Daily  . insulin aspart  0-5 Units Subcutaneous QHS  . insulin aspart  0-9 Units Subcutaneous TID WC  . potassium chloride  40 mEq Oral Q4H   Infusions: . sodium chloride 100 mL/hr at 09/17/16 1410   PRN Meds: ondansetron **OR** ondansetron (ZOFRAN) IV, oxyCODONE   Allergies as of 09/16/2016  . (No Known Allergies)    Family History    Problem Relation Age of Onset  . Heart attack Mother   . Gallbladder disease Father   . Heart attack Father   . Heart attack Brother     Social History   Social History  . Marital status: Married   Social History Main Topics  . Smoking status: Current Every Day Smoker  . Smokeless tobacco: Never Used  . Alcohol use Yes  . Drug use: No    REVIEW OF SYSTEMS: Constitutional:  No profound weakness or fatigue. He does feel exhausted by the recent events. ENT:  No nose bleeds Pulm:  No trouble breathing or cough. CV:  No palpitations, no LE edema. Her chest pain GU:  No hematuria, no frequency GI:  Per HPI.  Has never undergone colonoscopy or upper endoscopy. Heme:  No unusual bleeding or bruising.   Transfusions:  Not aware of any previous transfusions. Neuro:  No headaches, no peripheral tingling or numbness Derm:  Nonpruritic rash on his back.  Endocrine:  No sweats or chills.  No polyuria or dysuria His last gout flare was approximately one month ago. When he has gout he does use ibuprofen to control the symptoms. He gets gout flares despite taking allopurinol. Immunization:  Did not inquire as to vaccinations. Travel:  None beyond local counties in last few months.    PHYSICAL EXAM: Vital signs in last 24 hours: Vitals:   09/17/16 0528 09/17/16 1436  BP: 119/69 118/77  Pulse: 79 74  Resp: 18 18  Temp: 97.8 F (36.6 C) 98.1 F (36.7 C)   Wt Readings from Last 3 Encounters:  09/17/16 98.3 kg (216 lb 12.8 oz)  11/06/15 100.7 kg (222 lb)    General: Pleasant, well appearing but slightly tired, comfortable W M. Head:  No asymmetry or facial edema.  Eyes:  No scleral icterus, no conjunctival pallor. Ears:  Not hard of hearing  Nose:  No congestion or discharge Mouth:  Oropharynx is moist and clear. Tongue midline. Good dentition. Neck:  No JVD, no masses, no thyromegaly. Lungs:  Clear bilaterally. No labored breathing or cough. Heart: RRR. No MRG. S1, S2  present. Abdomen:  Soft. Not tender or distended. No masses. No HSM. No bruits. No hernias. Bowel sounds active..   Rectal: Not performed.   Musc/Skeltl: No joint redness or gross deformity. Extremities:  No CCE.  Neurologic:  Alert. Oriented times 3. No limb weakness or tremor. Good historian. Skin:  Some small pustules/rash versus acne on the mid to lower back. Nodes:  No cervical or inguinal adenopathy.   Psych:  Pleasant, calm, cooperative.  LAB RESULTS:  Recent Labs  09/16/16 1854 09/17/16 0332  WBC 8.8 6.5  HGB 15.4 13.0  HCT 45.5  39.0  PLT 296 244   BMET Lab Results  Component Value Date   NA 137 09/17/2016   NA 137 09/16/2016   K 3.3 (L) 09/17/2016   K 3.7 09/16/2016   CL 104 09/17/2016   CL 101 09/16/2016   CO2 24 09/17/2016   CO2 25 09/16/2016   GLUCOSE 159 (H) 09/17/2016   GLUCOSE 109 (H) 09/16/2016   BUN 7 09/17/2016   BUN 7 09/16/2016   CREATININE 1.23 09/17/2016   CREATININE 1.28 (H) 09/16/2016   CALCIUM 9.0 09/17/2016   CALCIUM 9.8 09/16/2016   LFT  Recent Labs  09/16/16 1854 09/17/16 0332  PROT 7.8 6.4*  ALBUMIN 4.4 3.5  AST 231* 156*  ALT 460* 349*  ALKPHOS 184* 161*  BILITOT 4.3* 3.6*   PT/INR Lab Results  Component Value Date   INR 1.15 09/17/2016   Lipase     Component Value Date/Time   LIPASE 25 09/16/2016 1854     RADIOLOGY STUDIES: images viewed Gatha Mayer, MD, [facility]  Mr 3d Recon At Scanner  Result Date: 09/17/2016 CLINICAL DATA:  Right upper quadrant abdominal pain for 3 years. Progressively worsening over the past 4 days. EXAM: MRI ABDOMEN WITHOUT AND WITH CONTRAST (INCLUDING MRCP) TECHNIQUE: Multiplanar multisequence MR imaging of the abdomen was performed both before and after the administration of intravenous contrast. Heavily T2-weighted images of the biliary and pancreatic ducts were obtained, and three-dimensional MRCP images were rendered by post processing. CONTRAST:  19 cc MULTIHANCE GADOBENATE DIMEGLUMINE 529  MG/ML IV SOLN COMPARISON:  Abdominal ultrasound 09/16/2016 and abdominal MRI from 11/06/2015 FINDINGS: Despite efforts by the technologist and patient, motion artifact is present on today's exam and could not be eliminated. This reduces exam sensitivity and specificity. Lower chest: Unremarkable Hepatobiliary: Multiple gallstones in the gallbladder measuring up to 2.8 cm in length. Mild periportal edema in the liver. 7 mm cyst peripherally in the right hepatic lobe on image 18/9 with similar 4 mm fluid signal intensity lesion. There is likely debris or sludge in the gallbladder fundus. Common bile duct measures up to 8 mm in diameter. The distal 3.5 cm of the CBD appears to be filled with debris or small stones, as on image 22/13. Faint accentuated mucosal thickening in this portion of the CBD 8 as on image 22/13. Mild hepatic steatosis. Pancreas:  Unremarkable Spleen:  Unremarkable Adrenals/Urinary Tract: 4.5 by 2.6 cm right mid kidney Bosniak category 2 cyst likely has a faint single internal septation. Adrenal glands normal. Stomach/Bowel: Unremarkable Vascular/Lymphatic:  Unremarkable Other:  No supplemental non-categorized findings. Musculoskeletal: Unremarkable IMPRESSION: 1. Debris or small gallstones fill the distal 3.5 cm of the CBD as shown on image 22/13. 2. Gallstones and debris are also present in the gallbladder. 3. Mild nonspecific periportal edema in the liver 4. Several small right hepatic lobe cysts. 5. Mild hepatic steatosis. 6. Benign right mid kidney cyst. Electronically Signed   By: Van Clines M.D.   On: 09/17/2016 14:28   Mr Abdomen Mrcp Moise Boring Contast  Result Date: 09/17/2016 CLINICAL DATA:  Right upper quadrant abdominal pain for 3 years. Progressively worsening over the past 4 days. EXAM: MRI ABDOMEN WITHOUT AND WITH CONTRAST (INCLUDING MRCP) TECHNIQUE: Multiplanar multisequence MR imaging of the abdomen was performed both before and after the administration of intravenous  contrast. Heavily T2-weighted images of the biliary and pancreatic ducts were obtained, and three-dimensional MRCP images were rendered by post processing. CONTRAST:  19 cc MULTIHANCE GADOBENATE DIMEGLUMINE 529 MG/ML IV SOLN  COMPARISON:  Abdominal ultrasound 09/16/2016 and abdominal MRI from 11/06/2015 FINDINGS: Despite efforts by the technologist and patient, motion artifact is present on today's exam and could not be eliminated. This reduces exam sensitivity and specificity. Lower chest: Unremarkable Hepatobiliary: Multiple gallstones in the gallbladder measuring up to 2.8 cm in length. Mild periportal edema in the liver. 7 mm cyst peripherally in the right hepatic lobe on image 18/9 with similar 4 mm fluid signal intensity lesion. There is likely debris or sludge in the gallbladder fundus. Common bile duct measures up to 8 mm in diameter. The distal 3.5 cm of the CBD appears to be filled with debris or small stones, as on image 22/13. Faint accentuated mucosal thickening in this portion of the CBD 8 as on image 22/13. Mild hepatic steatosis. Pancreas:  Unremarkable Spleen:  Unremarkable Adrenals/Urinary Tract: 4.5 by 2.6 cm right mid kidney Bosniak category 2 cyst likely has a faint single internal septation. Adrenal glands normal. Stomach/Bowel: Unremarkable Vascular/Lymphatic:  Unremarkable Other:  No supplemental non-categorized findings. Musculoskeletal: Unremarkable IMPRESSION: 1. Debris or small gallstones fill the distal 3.5 cm of the CBD as shown on image 22/13. 2. Gallstones and debris are also present in the gallbladder. 3. Mild nonspecific periportal edema in the liver 4. Several small right hepatic lobe cysts. 5. Mild hepatic steatosis. 6. Benign right mid kidney cyst. Electronically Signed   By: Van Clines M.D.   On: 09/17/2016 14:28   US Abdomen Limited Ruq  Result Date: 09/16/2016 CLINICAL DATA:  Right upper quadrant abdominal pain. Diabetes. History of gallstones. Nausea and vomiting.  EXAM: ULTRASOUND ABDOMEN LIMITED RIGHT UPPER QUADRANT COMPARISON:  06/19/2016 FINDINGS: Gallbladder: Shadowing gallstones measuring up to 3.1 cm in diameter. Sludge observed in the gallbladder. Gallbladder wall thickness up to 4 mm. Sonographic Murphy's sign absent. Common bile duct: Diameter: 6 mm Liver: No focal lesion identified. Coarse echogenic liver with poor sonic penetration compatible with diffuse hepatic steatosis. IMPRESSION: 1. Cholelithiasis with gallbladder wall thickening. No pericholecystic fluid or sonographic Murphy sign. Correlate clinically in assessing for acute cholecystitis. 2. Coarse echogenic liver with poor sonic penetration compatible with diffuse hepatic steatosis. 3. Mild CBD dilatation and 6 mm, without directly visualized choledocholithiasis. Electronically Signed   By: Van Clines M.D.   On: 09/16/2016 21:22      IMPRESSION:   *  Symptomatic cholelithiasis and question of choledocholithiasis with elevated LFTs showing an improving trend within the last 24 hours.  *  Hepatic steatosis. Patient doesn't drink much so it is not alcoholic in nature.Marland Kitchen    PLAN:     *  ERCP Around 9 AM tomorrow. Risks and benefits as well as the logistics of the procedure itself discussed with the patient and he, along with his wife listened and were agreeable to proceed. [place]key for him that right after the ERCP patient will under go his laparoscopic cholecystectomy.  *  Patient okay to have full liquids tonight but will be nothing by mouth after midnight for tomorrow's procedures and surgery   Azucena Freed  09/17/2016, 3:18 PM Pager: [place] Attending   I have taken an interval history, reviewed the chart and examined the patient. I agree with the Advanced Practitioner's note, impression and recommendations.   The risks and benefits as well as alternatives of endoscopic procedure(s) have been discussed and reviewed. All questions answered. The patient  agrees to proceed.  Gatha Mayer, MD, [facility] [phone] (pager) [phone] after 5 PM,  weekends and holidays  09/17/2016 6:58 PM

## 2016-09-17 NOTE — Consult Note (Addendum)
Reason for Consult:Gallstones and abnormal LFTs Referring Physician: [name] is an 46 y.o. male.  HPI: Known history of gallstones for about a year, multiple episodes over the last three years.  This epoisode much worse, did not notice jaundice, but dark urine noticed.  Past Medical History:  Diagnosis Date  . Diabetes mellitus without complication ([place])   . Gallstone   . Hypertension     History reviewed. No pertinent surgical history.  Family History  Problem Relation Age of Onset  . Heart attack Mother   . Gallbladder disease Father   . Heart attack Father   . Heart attack Brother     Social History:  reports that he has been smoking.  He has never used smokeless tobacco. He reports that he drinks alcohol. He reports that he does not use drugs.  Allergies: No Known Allergies  Medications: I have reviewed the patient's current medications.  Results for orders placed or performed during the hospital encounter of 09/16/16 (from the past 48 hour(s))  Lipase, blood     Status: None   Collection Time: 09/16/16  6:54 PM  Result Value Ref Range   Lipase 25 11 - 51 U/L  Comprehensive metabolic panel     Status: Abnormal   Collection Time: 09/16/16  6:54 PM  Result Value Ref Range   Sodium 137 135 - 145 mmol/L   Potassium 3.7 3.5 - 5.1 mmol/L   Chloride 101 101 - 111 mmol/L   CO2 25 22 - 32 mmol/L   Glucose, Bld 109 (H) 65 - 99 mg/dL   BUN 7 6 - 20 mg/dL   Creatinine, Ser 1.28 (H) 0.61 - 1.24 mg/dL   Calcium 9.8 8.9 - 10.3 mg/dL   Total Protein 7.8 6.5 - 8.1 g/dL   Albumin 4.4 3.5 - 5.0 g/dL   AST 231 (H) 15 - 41 U/L   ALT 460 (H) 17 - 63 U/L   Alkaline Phosphatase 184 (H) 38 - 126 U/L   Total Bilirubin 4.3 (H) 0.3 - 1.2 mg/dL   GFR calc non Af Amer >60 >60 mL/min   GFR calc Af Amer >60 >60 mL/min    Comment: (NOTE) The eGFR has been calculated using the CKD EPI equation. This calculation has not been validated in all clinical situations. eGFR's  persistently <60 mL/min signify possible Chronic Kidney Disease.    Anion gap 11 5 - 15  CBC     Status: None   Collection Time: 09/16/16  6:54 PM  Result Value Ref Range   WBC 8.8 4.0 - 10.5 K/uL   RBC 5.11 4.22 - 5.81 MIL/uL   Hemoglobin 15.4 13.0 - 17.0 g/dL   HCT 45.5 39.0 - 52.0 %   MCV 89.0 78.0 - 100.0 fL   MCH 30.1 26.0 - 34.0 pg   MCHC 33.8 30.0 - 36.0 g/dL   RDW 13.3 11.5 - 15.5 %   Platelets 296 150 - 400 K/uL  Urinalysis, Routine w reflex microscopic     Status: Abnormal   Collection Time: 09/16/16  7:45 PM  Result Value Ref Range   Color, Urine AMBER (A) YELLOW    Comment: BIOCHEMICALS MAY BE AFFECTED BY COLOR   APPearance CLEAR CLEAR   Specific Gravity, Urine 1.011 1.005 - 1.030   pH 6.0 5.0 - 8.0   Glucose, UA NEGATIVE NEGATIVE mg/dL   Hgb urine dipstick NEGATIVE NEGATIVE   Bilirubin Urine NEGATIVE NEGATIVE   Ketones, ur NEGATIVE NEGATIVE mg/dL  Protein, ur NEGATIVE NEGATIVE mg/dL   Nitrite NEGATIVE NEGATIVE   Leukocytes, UA NEGATIVE NEGATIVE  Glucose, capillary     Status: Abnormal   Collection Time: 09/17/16 12:08 AM  Result Value Ref Range   Glucose-Capillary 101 (H) 65 - 99 mg/dL  Comprehensive metabolic panel     Status: Abnormal   Collection Time: 09/17/16  3:32 AM  Result Value Ref Range   Sodium 137 135 - 145 mmol/L   Potassium 3.3 (L) 3.5 - 5.1 mmol/L   Chloride 104 101 - 111 mmol/L   CO2 24 22 - 32 mmol/L   Glucose, Bld 159 (H) 65 - 99 mg/dL   BUN 7 6 - 20 mg/dL   Creatinine, Ser 1.23 0.61 - 1.24 mg/dL   Calcium 9.0 8.9 - 10.3 mg/dL   Total Protein 6.4 (L) 6.5 - 8.1 g/dL   Albumin 3.5 3.5 - 5.0 g/dL   AST 156 (H) 15 - 41 U/L   ALT 349 (H) 17 - 63 U/L   Alkaline Phosphatase 161 (H) 38 - 126 U/L   Total Bilirubin 3.6 (H) 0.3 - 1.2 mg/dL   GFR calc non Af Amer >60 >60 mL/min   GFR calc Af Amer >60 >60 mL/min    Comment: (NOTE) The eGFR has been calculated using the CKD EPI equation. This calculation has not been validated in all clinical  situations. eGFR's persistently <60 mL/min signify possible Chronic Kidney Disease.    Anion gap 9 5 - 15  CBC     Status: None   Collection Time: 09/17/16  3:32 AM  Result Value Ref Range   WBC 6.5 4.0 - 10.5 K/uL   RBC 4.39 4.22 - 5.81 MIL/uL   Hemoglobin 13.0 13.0 - 17.0 g/dL   HCT 39.0 39.0 - 52.0 %   MCV 88.8 78.0 - 100.0 fL   MCH 29.6 26.0 - 34.0 pg   MCHC 33.3 30.0 - 36.0 g/dL   RDW 13.2 11.5 - 15.5 %   Platelets 244 150 - 400 K/uL  Protime-INR     Status: None   Collection Time: 09/17/16  3:32 AM  Result Value Ref Range   Prothrombin Time 14.8 11.4 - 15.2 seconds   INR 1.15     US Abdomen Limited Ruq  Result Date: 09/16/2016 CLINICAL DATA:  Right upper quadrant abdominal pain. Diabetes. History of gallstones. Nausea and vomiting. EXAM: ULTRASOUND ABDOMEN LIMITED RIGHT UPPER QUADRANT COMPARISON:  06/19/2016 FINDINGS: Gallbladder: Shadowing gallstones measuring up to 3.1 cm in diameter. Sludge observed in the gallbladder. Gallbladder wall thickness up to 4 mm. Sonographic Murphy's sign absent. Common bile duct: Diameter: 6 mm Liver: No focal lesion identified. Coarse echogenic liver with poor sonic penetration compatible with diffuse hepatic steatosis. IMPRESSION: 1. Cholelithiasis with gallbladder wall thickening. No pericholecystic fluid or sonographic Murphy sign. Correlate clinically in assessing for acute cholecystitis. 2. Coarse echogenic liver with poor sonic penetration compatible with diffuse hepatic steatosis. 3. Mild CBD dilatation and 6 mm, without directly visualized choledocholithiasis. Electronically Signed   By: Van Clines M.D.   On: 09/16/2016 21:22    Review of Systems  Constitutional: Negative for chills and fever.  Cardiovascular: Negative for chest pain.  Gastrointestinal: Positive for abdominal pain, nausea and vomiting.  Skin:       jaundice  All other systems reviewed and are negative.  Blood pressure 119/69, pulse 79, temperature 97.8 F  (36.6 C), temperature source Oral, resp. rate 18, height '6\' 2"'$  (1.88 m), weight 98.3 kg (  216 lb 12.8 oz), SpO2 100 %. Physical Exam  Constitutional: He is oriented to person, place, and time. He appears well-developed and well-nourished.  HENT:  Head: Normocephalic and atraumatic.  Eyes: Conjunctivae and EOM are normal. Pupils are equal, round, and reactive to light. Scleral icterus is present.  Neck: Normal range of motion. Neck supple.  Cardiovascular: Normal rate and regular rhythm.   No murmur heard. Respiratory: Effort normal.  GI: Soft. Bowel sounds are normal. There is tenderness in the right upper quadrant and epigastric area. There is positive Murphy's sign.  Musculoskeletal: Normal range of motion.  Neurological: He is alert and oriented to person, place, and time. He has normal reflexes.  Skin: Skin is warm and dry.  Psychiatric: He has a normal mood and affect.    Assessment/Plan: Cholelithiasis with symptoms, and hyperbilirubinemia.  Scheduled for MRCP, possible ERCP Will need cholecystectomy eventually.  Brandon Hogan 09/17/2016, 6:52 AM

## 2016-09-17 NOTE — Progress Notes (Signed)
Returned to unit from MRI

## 2016-09-17 NOTE — Progress Notes (Signed)
Off unit to MRI for scheduled MRCP.

## 2016-09-18 ENCOUNTER — Encounter (HOSPITAL_COMMUNITY)
Admission: EM | Disposition: A | Discharge status: Home / Self Care | Payer: Self-pay | Source: Home / Self Care | Attending: Internal Medicine

## 2016-09-18 ENCOUNTER — Inpatient Hospital Stay (HOSPITAL_COMMUNITY): Payer: BLUE CROSS/BLUE SHIELD | Admitting: Anesthesiology

## 2016-09-18 ENCOUNTER — Inpatient Hospital Stay (HOSPITAL_COMMUNITY): Payer: BLUE CROSS/BLUE SHIELD

## 2016-09-18 ENCOUNTER — Encounter (HOSPITAL_COMMUNITY): Payer: Self-pay | Admitting: *Deleted

## 2016-09-18 DIAGNOSIS — R7989 Other specified abnormal findings of blood chemistry: Secondary | ICD-10-CM

## 2016-09-18 DIAGNOSIS — K8051 Calculus of bile duct without cholangitis or cholecystitis with obstruction: Secondary | ICD-10-CM

## 2016-09-18 HISTORY — PX: CHOLECYSTECTOMY: SHX55

## 2016-09-18 HISTORY — PX: ERCP: SHX5425

## 2016-09-18 LAB — CBC WITH DIFFERENTIAL/PLATELET
Basophils Absolute: 0.1 10*3/uL (ref 0.0–0.1)
Basophils Relative: 1 %
EOS PCT: 5 %
Eosinophils Absolute: 0.2 10*3/uL (ref 0.0–0.7)
HCT: 39.9 % (ref 39.0–52.0)
Hemoglobin: 13.1 g/dL (ref 13.0–17.0)
LYMPHS ABS: 1.6 10*3/uL (ref 0.7–4.0)
LYMPHS PCT: 31 %
MCH: 29.4 pg (ref 26.0–34.0)
MCHC: 32.8 g/dL (ref 30.0–36.0)
MCV: 89.7 fL (ref 78.0–100.0)
MONOS PCT: 10 %
Monocytes Absolute: 0.5 10*3/uL (ref 0.1–1.0)
Neutro Abs: 2.8 10*3/uL (ref 1.7–7.7)
Neutrophils Relative %: 53 %
PLATELETS: 236 10*3/uL (ref 150–400)
RBC: 4.45 MIL/uL (ref 4.22–5.81)
RDW: 13.4 % (ref 11.5–15.5)
WBC: 5.2 10*3/uL (ref 4.0–10.5)

## 2016-09-18 LAB — COMPREHENSIVE METABOLIC PANEL
ALK PHOS: 179 U/L — AB (ref 38–126)
ALT: 290 U/L — ABNORMAL HIGH (ref 17–63)
AST: 114 U/L — ABNORMAL HIGH (ref 15–41)
Albumin: 3.4 g/dL — ABNORMAL LOW (ref 3.5–5.0)
Anion gap: 7 (ref 5–15)
BUN: 7 mg/dL (ref 6–20)
CALCIUM: 9.5 mg/dL (ref 8.9–10.3)
CHLORIDE: 106 mmol/L (ref 101–111)
CO2: 26 mmol/L (ref 22–32)
CREATININE: 1.26 mg/dL — AB (ref 0.61–1.24)
Glucose, Bld: 91 mg/dL (ref 65–99)
Potassium: 4 mmol/L (ref 3.5–5.1)
Sodium: 139 mmol/L (ref 135–145)
Total Bilirubin: 3.5 mg/dL — ABNORMAL HIGH (ref 0.3–1.2)
Total Protein: 6.1 g/dL — ABNORMAL LOW (ref 6.5–8.1)

## 2016-09-18 LAB — GLUCOSE, CAPILLARY
GLUCOSE-CAPILLARY: 90 mg/dL (ref 65–99)
Glucose-Capillary: 86 mg/dL (ref 65–99)
Glucose-Capillary: 85 mg/dL (ref 65–99)

## 2016-09-18 LAB — SURGICAL PCR SCREEN
MRSA, PCR: NEGATIVE
STAPHYLOCOCCUS AUREUS: NEGATIVE

## 2016-09-18 LAB — MAGNESIUM: Magnesium: 1.9 mg/dL (ref 1.7–2.4)

## 2016-09-18 LAB — PROTIME-INR
INR: 1.06
PROTHROMBIN TIME: 13.8 s (ref 11.4–15.2)

## 2016-09-18 LAB — HEPATITIS PANEL, ACUTE
HCV Ab: <0.1 s/co ratio (ref 0.0–0.9)
HEP B S AG: NEGATIVE
Hep A IgM: NEGATIVE
Hep B C IgM: NEGATIVE

## 2016-09-18 SURGERY — ERCP, WITH INTERVENTION IF INDICATED
Anesthesia: Monitor Anesthesia Care

## 2016-09-18 SURGERY — LAPAROSCOPIC CHOLECYSTECTOMY WITH INTRAOPERATIVE CHOLANGIOGRAM
Anesthesia: General | Site: Abdomen

## 2016-09-18 MED ORDER — LIDOCAINE HCL (PF) 1 % IJ SOLN
INTRAMUSCULAR | Status: AC
  Filled 2016-09-18: qty 30

## 2016-09-18 MED ORDER — LACTATED RINGERS IV SOLN
INTRAVENOUS | Status: DC | PRN
  Administered 2016-09-18 (×2): via INTRAVENOUS

## 2016-09-18 MED ORDER — PROPOFOL 10 MG/ML IV BOLUS
INTRAVENOUS | Status: AC
  Filled 2016-09-18: qty 20

## 2016-09-18 MED ORDER — PROPOFOL 10 MG/ML IV BOLUS
INTRAVENOUS | Status: DC | PRN
  Administered 2016-09-18: 140 mg via INTRAVENOUS

## 2016-09-18 MED ORDER — HYDROMORPHONE HCL 1 MG/ML IJ SOLN
0.5000 mg | INTRAMUSCULAR | Status: DC | PRN
  Administered 2016-09-18 – 2016-09-19 (×3): 0.5 mg via INTRAVENOUS
  Filled 2016-09-18 (×3): qty 1

## 2016-09-18 MED ORDER — FENTANYL CITRATE (PF) 250 MCG/5ML IJ SOLN
INTRAMUSCULAR | Status: AC
  Filled 2016-09-18: qty 5

## 2016-09-18 MED ORDER — LIDOCAINE HCL 1 % IJ SOLN
INTRAMUSCULAR | Status: DC | PRN
  Administered 2016-09-18: 9 mL via INTRAMUSCULAR

## 2016-09-18 MED ORDER — LIDOCAINE HCL (CARDIAC) 20 MG/ML IV SOLN
INTRAVENOUS | Status: DC | PRN
  Administered 2016-09-18: 60 mg via INTRAVENOUS

## 2016-09-18 MED ORDER — 0.9 % SODIUM CHLORIDE (POUR BTL) OPTIME
TOPICAL | Status: DC | PRN
  Administered 2016-09-18: 1000 mL

## 2016-09-18 MED ORDER — FENTANYL CITRATE (PF) 100 MCG/2ML IJ SOLN
INTRAMUSCULAR | Status: DC | PRN
  Administered 2016-09-18: 100 ug via INTRAVENOUS
  Administered 2016-09-18: 250 ug via INTRAVENOUS
  Administered 2016-09-18: 50 ug via INTRAVENOUS
  Administered 2016-09-18: 150 ug via INTRAVENOUS

## 2016-09-18 MED ORDER — SODIUM CHLORIDE 0.9 % IR SOLN
Status: DC | PRN
  Administered 2016-09-18: 1000 mL

## 2016-09-18 MED ORDER — BUPIVACAINE-EPINEPHRINE (PF) 0.25% -1:200000 IJ SOLN
INTRAMUSCULAR | Status: AC
  Filled 2016-09-18: qty 30

## 2016-09-18 MED ORDER — FENTANYL CITRATE (PF) 250 MCG/5ML IJ SOLN
INTRAMUSCULAR | Status: AC
Start: 2016-09-18 — End: 2016-09-18
  Filled 2016-09-18: qty 5

## 2016-09-18 MED ORDER — ONDANSETRON HCL 4 MG/2ML IJ SOLN
INTRAMUSCULAR | Status: AC
  Filled 2016-09-18: qty 2

## 2016-09-18 MED ORDER — ONDANSETRON HCL 4 MG/2ML IJ SOLN
INTRAMUSCULAR | Status: DC | PRN
  Administered 2016-09-18: 4 mg via INTRAVENOUS

## 2016-09-18 MED ORDER — IOPAMIDOL (ISOVUE-300) INJECTION 61%
INTRAVENOUS | Status: DC | PRN
  Administered 2016-09-18: 30 mL

## 2016-09-18 MED ORDER — PROMETHAZINE HCL 25 MG/ML IJ SOLN
12.5000 mg | Freq: Four times a day (QID) | INTRAMUSCULAR | Status: DC | PRN
  Administered 2016-09-18 – 2016-09-19 (×3): 12.5 mg via INTRAVENOUS
  Filled 2016-09-18 (×3): qty 1

## 2016-09-18 MED ORDER — FENTANYL CITRATE (PF) 100 MCG/2ML IJ SOLN
25.0000 ug | INTRAMUSCULAR | Status: DC | PRN
  Administered 2016-09-18: 50 ug via INTRAVENOUS
  Administered 2016-09-18: 25 ug via INTRAVENOUS

## 2016-09-18 MED ORDER — IOPAMIDOL (ISOVUE-300) INJECTION 61%
INTRAVENOUS | Status: AC
  Filled 2016-09-18: qty 50

## 2016-09-18 MED ORDER — SUGAMMADEX SODIUM 200 MG/2ML IV SOLN
INTRAVENOUS | Status: AC
  Filled 2016-09-18: qty 2

## 2016-09-18 MED ORDER — ROCURONIUM BROMIDE 100 MG/10ML IV SOLN
INTRAVENOUS | Status: DC | PRN
  Administered 2016-09-18: 70 mg via INTRAVENOUS
  Administered 2016-09-18: 10 mg via INTRAVENOUS

## 2016-09-18 MED ORDER — OXYCODONE HCL 5 MG PO TABS: 5.0000 mg | ORAL_TABLET | Freq: Once | ORAL | Status: DC | PRN

## 2016-09-18 MED ORDER — LACTATED RINGERS IV SOLN
INTRAVENOUS | Status: DC
  Administered 2016-09-19 – 2016-09-20 (×2): via INTRAVENOUS

## 2016-09-18 MED ORDER — MIDAZOLAM HCL 2 MG/2ML IJ SOLN
INTRAMUSCULAR | Status: AC
  Filled 2016-09-18: qty 2

## 2016-09-18 MED ORDER — MORPHINE SULFATE (PF) 4 MG/ML IV SOLN
1.0000 mg | INTRAVENOUS | Status: DC | PRN
  Administered 2016-09-18: 2 mg via INTRAVENOUS
  Filled 2016-09-18: qty 1

## 2016-09-18 MED ORDER — SUGAMMADEX SODIUM 200 MG/2ML IV SOLN
INTRAVENOUS | Status: DC | PRN
  Administered 2016-09-18: 150 mg via INTRAVENOUS

## 2016-09-18 MED ORDER — OXYCODONE HCL 5 MG/5ML PO SOLN: 5.0000 mg | Freq: Once | ORAL | Status: DC | PRN

## 2016-09-18 MED ORDER — FENTANYL CITRATE (PF) 100 MCG/2ML IJ SOLN
INTRAMUSCULAR | Status: AC
  Filled 2016-09-18: qty 2

## 2016-09-18 SURGICAL SUPPLY — 46 items
ADH SKN CLS APL DERMABOND .7 (GAUZE/BANDAGES/DRESSINGS) ×2
APPLIER CLIP ROT 10 11.4 M/L (STAPLE) ×4
APR CLP MED LRG 11.4X10 (STAPLE) ×2
BAG SPEC RTRVL LRG 6X4 10 (ENDOMECHANICALS) ×2
BLADE CLIPPER SURG (BLADE) ×2 IMPLANT
CANISTER SUCT 3000ML PPV (MISCELLANEOUS) ×4 IMPLANT
CHLORAPREP W/TINT 26ML (MISCELLANEOUS) ×4 IMPLANT
CLIP APPLIE ROT 10 11.4 M/L (STAPLE) ×2 IMPLANT
COVER SURGICAL LIGHT HANDLE (MISCELLANEOUS) ×4 IMPLANT
DERMABOND ADVANCED (GAUZE/BANDAGES/DRESSINGS) ×2
DERMABOND ADVANCED .7 DNX12 (GAUZE/BANDAGES/DRESSINGS) ×2 IMPLANT
DRAPE WARM FLUID 44X44 (DRAPE) ×4 IMPLANT
ELECT CAUTERY BLADE 6.4 (BLADE) ×2 IMPLANT
ELECT REM PT RETURN 9FT ADLT (ELECTROSURGICAL) ×4
ELECTRODE REM PT RTRN 9FT ADLT (ELECTROSURGICAL) ×2 IMPLANT
ENDOLOOP SUT PDS II  0 18 (SUTURE) ×2
ENDOLOOP SUT PDS II 0 18 (SUTURE) IMPLANT
GLOVE BIO SURGEON STRL SZ 6 (GLOVE) ×4 IMPLANT
GLOVE BIO SURGEON STRL SZ7.5 (GLOVE) ×2 IMPLANT
GLOVE BIOGEL PI IND STRL 6.5 (GLOVE) ×2 IMPLANT
GLOVE BIOGEL PI IND STRL 7.5 (GLOVE) IMPLANT
GLOVE BIOGEL PI INDICATOR 6.5 (GLOVE) ×2
GLOVE BIOGEL PI INDICATOR 7.5 (GLOVE) ×2
GOWN STRL REUS W/ TWL LRG LVL3 (GOWN DISPOSABLE) ×4 IMPLANT
GOWN STRL REUS W/TWL 2XL LVL3 (GOWN DISPOSABLE) ×4 IMPLANT
GOWN STRL REUS W/TWL LRG LVL3 (GOWN DISPOSABLE) ×12
KIT BASIN OR (CUSTOM PROCEDURE TRAY) ×4 IMPLANT
KIT ROOM TURNOVER OR (KITS) ×4 IMPLANT
L-HOOK LAP DISP 36CM (ELECTROSURGICAL) ×4
LHOOK LAP DISP 36CM (ELECTROSURGICAL) ×2 IMPLANT
NS IRRIG 1000ML POUR BTL (IV SOLUTION) ×4 IMPLANT
PAD ARMBOARD 7.5X6 YLW CONV (MISCELLANEOUS) ×4 IMPLANT
PENCIL BUTTON HOLSTER BLD 10FT (ELECTRODE) ×4 IMPLANT
POUCH SPECIMEN RETRIEVAL 10MM (ENDOMECHANICALS) ×4 IMPLANT
SCISSORS LAP 5X35 DISP (ENDOMECHANICALS) ×4 IMPLANT
SET IRRIG TUBING LAPAROSCOPIC (IRRIGATION / IRRIGATOR) ×4 IMPLANT
SLEEVE ENDOPATH XCEL 5M (ENDOMECHANICALS) ×4 IMPLANT
SPECIMEN JAR SMALL (MISCELLANEOUS) ×4 IMPLANT
SUT MNCRL AB 4-0 PS2 18 (SUTURE) ×6 IMPLANT
SUT VICRYL 0 UR6 27IN ABS (SUTURE) ×2 IMPLANT
TOWEL OR 17X24 6PK STRL BLUE (TOWEL DISPOSABLE) ×4 IMPLANT
TRAY LAPAROSCOPIC MC (CUSTOM PROCEDURE TRAY) ×4 IMPLANT
TROCAR XCEL BLUNT TIP 100MML (ENDOMECHANICALS) ×4 IMPLANT
TROCAR XCEL NON-BLD 11X100MML (ENDOMECHANICALS) ×4 IMPLANT
TROCAR XCEL NON-BLD 5MMX100MML (ENDOMECHANICALS) ×4 IMPLANT
TUBING INSUFFLATION (TUBING) ×4 IMPLANT

## 2016-09-18 NOTE — Transfer of Care (Signed)
Immediate Anesthesia Transfer of Care Note  Patient: Brandon Hogan  Procedure(s) Performed: Procedure(s): LAPAROSCOPIC CHOLECYSTECTOMY (N/A) ENDOSCOPIC RETROGRADE CHOLANGIOPANCREATOGRAPHY (ERCP) (N/A)  Patient Location: PACU  Anesthesia Type:General  Level of Consciousness: awake, oriented, sedated and patient cooperative  Airway & Oxygen Therapy: Patient Spontanous Breathing and Patient connected to nasal cannula oxygen  Post-op Assessment: Report given to RN and Post -op Vital signs reviewed and stable  Post vital signs: Reviewed and stable  Last Vitals:  Vitals:   09/18/16 0518 09/18/16 1115  BP: 117/76 (!) (P) 149/89  Pulse: 73 75  Resp: 18 11  Temp: 36.7 C 36.1 C    Last Pain:  Vitals:   09/18/16 1115  TempSrc:   PainSc: (P) 0-No pain         Complications: No apparent anesthesia complications

## 2016-09-18 NOTE — Op Note (Signed)
Laparoscopic Cholecystectomy  Indications: This patient presents with choledocholithiasis and choledocholithiasis and will undergo laparoscopic cholecystectomy.  He had successful ERCP just prior to surgery in the same anesthetic.   Pre-operative Diagnosis: See above  Post-operative Diagnosis: Same  Surgeon: Almond Lint   Assistants: Magnus Ivan, RNFA  Anesthesia: General endotracheal anesthesia and local  ASA Class: 2  Procedure Details  The patient was seen again in the Holding Room. The risks, benefits, complications, treatment options, and expected outcomes were discussed with the patient. The possibilities of  bleeding, recurrent infection, damage to nearby structures, the need for additional procedures, failure to diagnose a condition, the possible need to convert to an open procedure, and creating a complication requiring transfusion or operation were discussed with the patient. The likelihood of improving the patient's symptoms with return to their baseline status is good.    The patient and/or family concurred with the proposed plan, giving informed consent. The site of surgery properly noted. The patient was taken to Operating Room, and the procedure verified as Laparoscopic Cholecystectomy with Possible Intraoperative Cholangiogram. A Time Out was held and the above information confirmed.  Prior to the induction of general anesthesia, antibiotic prophylaxis was administered. General endotracheal anesthesia was then administered and tolerated well. After the induction, the abdomen was prepped with Chloraprep and draped in the sterile fashion. The patient was positioned in the supine position.  Local anesthetic agent was injected into the skin near the umbilicus and an incision made. We dissected down to the abdominal fascia with blunt dissection.  The fascia was incised vertically and we entered the peritoneal cavity bluntly.  A pursestring suture of 0-Vicryl was placed around the  fascial opening.  The Hasson cannula was inserted and secured with the stay suture.  Pneumoperitoneum was then created with CO2 and tolerated well without any adverse changes in the patient's vital signs. An 11-mm port was placed in the subxiphoid position.  Two 5-mm ports were placed in the right upper quadrant. All skin incisions were infiltrated with a local anesthetic agent before making the incision and placing the trocars.   We positioned the patient in reverse Trendelenburg, tilted slightly to the patient's left.  The gallbladder was identified, the fundus grasped and retracted cephalad. Adhesions were lysed bluntly and with the electrocautery where indicated, taking care not to injure any adjacent organs or viscus. The infundibulum was grasped and retracted laterally, exposing the peritoneum overlying the triangle of Calot. This was then divided and exposed in a blunt fashion. A critical view of the cystic duct and cystic artery was obtained.  The cystic duct was clearly identified and bluntly dissected circumferentially.  It was large and inflamed.  It was also quite short.  The cystic duct was ligated with a clip distally.  The cystic duct was then ligated with clips and divided. Due to the size of the duct and the previous stones, an Endoloop was placed as well on the cystic duct stump.  The cystic artery was identified, dissected free, ligated with clips and divided as well.   The gallbladder was dissected from the liver bed in retrograde fashion with the electrocautery. The gallbladder was removed and placed in an Endocatch bag.  The gallbladder and Endocatch bag were then removed through the umbilical port site. There was a very large stone that required the fascia and skin incisions to be enlarged a bit. The liver bed was irrigated and inspected. Hemostasis was achieved with the electrocautery. Copious irrigation was utilized and was repeatedly  aspirated until clear.    We again inspected the  right upper quadrant for hemostasis.  Pneumoperitoneum was released as we removed the trocars.   The pursestring suture was used to close the umbilical fascia.  4-0 Monocryl was used to close the skin.   The skin was cleaned and dry, and Dermabond was applied. The patient was then extubated and brought to the recovery room in stable condition. Instrument, sponge, and needle counts were correct at closure and at the conclusion of the case.   Findings: Acute cholecystitis with large stone.    Estimated Blood Loss: min         Drains: none          Specimens: Gallbladder to pathology       Complications: None; patient tolerated the procedure well.         Disposition: PACU - hemodynamically stable.         Condition: stable

## 2016-09-18 NOTE — H&P (View-Only) (Signed)
[facility] Consult: 3:18 PM 09/17/2016  LOS: 0 days    Referring Provider: Dr Grandville Silos  Primary Care Physician:  Jani Gravel, MD Primary Gastroenterologist:  unassigned    Reason for Consultation:  Choledocholithiasis   HPI: Brandon Hogan is a 46 y.o. male.  PMH type 2 DM.  HTN.  Gout. Episodes of right upper quadrant pain and nausea over 2 or 3 years.   Ultrasound in 10/2015 showed fatty liver and uncomplicated gallstones along with a mass in the left kidney. He had an MRI in 11/2015 but not an MRCP. 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 It also confirmed cholelithiasis and fatty liver. Ultrasound 06/2016 confirmed gallstones as well as a fatty liver, CBD of 4 mm. His PCP initiated the patient on Actigall after he declined surgical evaluation. He's been taking the medication since then. For about 3 days he's had onset of right upper quadrant pain which has not resolved. It is associated with nausea and vomiting, worsened with food. The pain is located in the right upper quadrant and is severe, sharp and absent flows. At times it radiates to the scapula. He tried taking acetaminophen and Naprosyn but it did not help.  So he presented to the ED. T bili 4.3 ..3.6.  Alk phos 184 >> 161.  AST/ALT 231/460>> 156/349.  Abdominal ultrasound shows gallbladder wall thickening, cholelithiasis. Diffuse hepatic steatosis. CBD 6 mm but no visualized choledocholithiasis.  Although his MRCP was performed this morning there was some sort of technical complication with the software and it was finally read at about 3 PM today.  Findings include multiple gallbladder stones, mild. Portal edema at the liver. Small right hepatic cyst which may represent debris or sludge in the gallbladder fundus. CBD measuring up to 8  mm in the distal common bile duct there appears to be debris versus small stones.     Since his admission, the pain has subsided. He last received oxycodone for pain management early this morning and hasn't required any pain medication since then. He hasn't had any recurrent nausea or vomiting. Patient drinks maybe a couple of beers a month if that. Family history of cholecystectomy in his father when he was in his 30 or 29. More recently his father has undergone nephrectomy. Patient not aware of any family history of ulcer disease, colorectal cancer or inflammatory bowel disease.  Past Medical History:  Diagnosis Date  . Diabetes mellitus without complication ([place])   . Gallstone   . Hypertension     History reviewed. No pertinent surgical history.  Prior to Admission medications   Medication Sig Start Date End Date Taking? Authorizing Provider  allopurinol (ZYLOPRIM) 100 MG tablet Take 100 mg by mouth 2 (two) times daily.   Yes [provider]  aspirin EC 81 MG tablet Take 81 mg by mouth daily.   Yes [provider]  Icosapent Ethyl 1 g CAPS Take 1 capsule by mouth daily.   Yes [provider]  lisinopril-hydrochlorothiazide (PRINZIDE,ZESTORETIC) 10-12.5 MG tablet Take 1 tablet by mouth  daily. 09/25/15  Yes [provider]  metFORMIN (GLUCOPHAGE-XR) 500 MG 24 hr tablet Take 500 mg by mouth every morning. 09/16/15  Yes [provider]    Scheduled Meds: . allopurinol  100 mg Oral BID  . aspirin EC  81 mg Oral Daily  . lisinopril  10 mg Oral Daily   And  . hydrochlorothiazide  12.5 mg Oral Daily  . insulin aspart  0-5 Units Subcutaneous QHS  . insulin aspart  0-9 Units Subcutaneous TID WC  . potassium chloride  40 mEq Oral Q4H   Infusions: . sodium chloride 100 mL/hr at 09/17/16 1410   PRN Meds: ondansetron **OR** ondansetron (ZOFRAN) IV, oxyCODONE   Allergies as of 09/16/2016  . (No Known Allergies)    Family History    Problem Relation Age of Onset  . Heart attack Mother   . Gallbladder disease Father   . Heart attack Father   . Heart attack Brother     Social History   Social History  . Marital status: Married   Social History Main Topics  . Smoking status: Current Every Day Smoker  . Smokeless tobacco: Never Used  . Alcohol use Yes  . Drug use: No    REVIEW OF SYSTEMS: Constitutional:  No profound weakness or fatigue. He does feel exhausted by the recent events. ENT:  No nose bleeds Pulm:  No trouble breathing or cough. CV:  No palpitations, no LE edema. Her chest pain GU:  No hematuria, no frequency GI:  Per HPI.  Has never undergone colonoscopy or upper endoscopy. Heme:  No unusual bleeding or bruising.   Transfusions:  Not aware of any previous transfusions. Neuro:  No headaches, no peripheral tingling or numbness Derm:  Nonpruritic rash on his back.  Endocrine:  No sweats or chills.  No polyuria or dysuria His last gout flare was approximately one month ago. When he has gout he does use ibuprofen to control the symptoms. He gets gout flares despite taking allopurinol. Immunization:  Did not inquire as to vaccinations. Travel:  None beyond local counties in last few months.    PHYSICAL EXAM: Vital signs in last 24 hours: Vitals:   09/17/16 0528 09/17/16 1436  BP: 119/69 118/77  Pulse: 79 74  Resp: 18 18  Temp: 97.8 F (36.6 C) 98.1 F (36.7 C)   Wt Readings from Last 3 Encounters:  09/17/16 98.3 kg (216 lb 12.8 oz)  11/06/15 100.7 kg (222 lb)    General: Pleasant, well appearing but slightly tired, comfortable W M. Head:  No asymmetry or facial edema.  Eyes:  No scleral icterus, no conjunctival pallor. Ears:  Not hard of hearing  Nose:  No congestion or discharge Mouth:  Oropharynx is moist and clear. Tongue midline. Good dentition. Neck:  No JVD, no masses, no thyromegaly. Lungs:  Clear bilaterally. No labored breathing or cough. Heart: RRR. No MRG. S1, S2  present. Abdomen:  Soft. Not tender or distended. No masses. No HSM. No bruits. No hernias. Bowel sounds active..   Rectal: Not performed.   Musc/Skeltl: No joint redness or gross deformity. Extremities:  No CCE.  Neurologic:  Alert. Oriented times 3. No limb weakness or tremor. Good historian. Skin:  Some small pustules/rash versus acne on the mid to lower back. Nodes:  No cervical or inguinal adenopathy.   Psych:  Pleasant, calm, cooperative.  LAB RESULTS:  Recent Labs  09/16/16 1854 09/17/16 0332  WBC 8.8 6.5  HGB 15.4 13.0  HCT 45.5  39.0  PLT 296 244   BMET Lab Results  Component Value Date   NA 137 09/17/2016   NA 137 09/16/2016   K 3.3 (L) 09/17/2016   K 3.7 09/16/2016   CL 104 09/17/2016   CL 101 09/16/2016   CO2 24 09/17/2016   CO2 25 09/16/2016   GLUCOSE 159 (H) 09/17/2016   GLUCOSE 109 (H) 09/16/2016   BUN 7 09/17/2016   BUN 7 09/16/2016   CREATININE 1.23 09/17/2016   CREATININE 1.28 (H) 09/16/2016   CALCIUM 9.0 09/17/2016   CALCIUM 9.8 09/16/2016   LFT  Recent Labs  09/16/16 1854 09/17/16 0332  PROT 7.8 6.4*  ALBUMIN 4.4 3.5  AST 231* 156*  ALT 460* 349*  ALKPHOS 184* 161*  BILITOT 4.3* 3.6*   PT/INR Lab Results  Component Value Date   INR 1.15 09/17/2016   Lipase     Component Value Date/Time   LIPASE 25 09/16/2016 1854     RADIOLOGY STUDIES: images viewed Gatha Mayer, MD, [facility]  Mr 3d Recon At Scanner  Result Date: 09/17/2016 CLINICAL DATA:  Right upper quadrant abdominal pain for 3 years. Progressively worsening over the past 4 days. EXAM: MRI ABDOMEN WITHOUT AND WITH CONTRAST (INCLUDING MRCP) TECHNIQUE: Multiplanar multisequence MR imaging of the abdomen was performed both before and after the administration of intravenous contrast. Heavily T2-weighted images of the biliary and pancreatic ducts were obtained, and three-dimensional MRCP images were rendered by post processing. CONTRAST:  19 cc MULTIHANCE GADOBENATE DIMEGLUMINE 529  MG/ML IV SOLN COMPARISON:  Abdominal ultrasound 09/16/2016 and abdominal MRI from 11/06/2015 FINDINGS: Despite efforts by the technologist and patient, motion artifact is present on today's exam and could not be eliminated. This reduces exam sensitivity and specificity. Lower chest: Unremarkable Hepatobiliary: Multiple gallstones in the gallbladder measuring up to 2.8 cm in length. Mild periportal edema in the liver. 7 mm cyst peripherally in the right hepatic lobe on image 18/9 with similar 4 mm fluid signal intensity lesion. There is likely debris or sludge in the gallbladder fundus. Common bile duct measures up to 8 mm in diameter. The distal 3.5 cm of the CBD appears to be filled with debris or small stones, as on image 22/13. Faint accentuated mucosal thickening in this portion of the CBD 8 as on image 22/13. Mild hepatic steatosis. Pancreas:  Unremarkable Spleen:  Unremarkable Adrenals/Urinary Tract: 4.5 by 2.6 cm right mid kidney Bosniak category 2 cyst likely has a faint single internal septation. Adrenal glands normal. Stomach/Bowel: Unremarkable Vascular/Lymphatic:  Unremarkable Other:  No supplemental non-categorized findings. Musculoskeletal: Unremarkable IMPRESSION: 1. Debris or small gallstones fill the distal 3.5 cm of the CBD as shown on image 22/13. 2. Gallstones and debris are also present in the gallbladder. 3. Mild nonspecific periportal edema in the liver 4. Several small right hepatic lobe cysts. 5. Mild hepatic steatosis. 6. Benign right mid kidney cyst. Electronically Signed   By: Van Clines M.D.   On: 09/17/2016 14:28   Mr Abdomen Mrcp Moise Boring Contast  Result Date: 09/17/2016 CLINICAL DATA:  Right upper quadrant abdominal pain for 3 years. Progressively worsening over the past 4 days. EXAM: MRI ABDOMEN WITHOUT AND WITH CONTRAST (INCLUDING MRCP) TECHNIQUE: Multiplanar multisequence MR imaging of the abdomen was performed both before and after the administration of intravenous  contrast. Heavily T2-weighted images of the biliary and pancreatic ducts were obtained, and three-dimensional MRCP images were rendered by post processing. CONTRAST:  19 cc MULTIHANCE GADOBENATE DIMEGLUMINE 529 MG/ML IV SOLN  COMPARISON:  Abdominal ultrasound 09/16/2016 and abdominal MRI from 11/06/2015 FINDINGS: Despite efforts by the technologist and patient, motion artifact is present on today's exam and could not be eliminated. This reduces exam sensitivity and specificity. Lower chest: Unremarkable Hepatobiliary: Multiple gallstones in the gallbladder measuring up to 2.8 cm in length. Mild periportal edema in the liver. 7 mm cyst peripherally in the right hepatic lobe on image 18/9 with similar 4 mm fluid signal intensity lesion. There is likely debris or sludge in the gallbladder fundus. Common bile duct measures up to 8 mm in diameter. The distal 3.5 cm of the CBD appears to be filled with debris or small stones, as on image 22/13. Faint accentuated mucosal thickening in this portion of the CBD 8 as on image 22/13. Mild hepatic steatosis. Pancreas:  Unremarkable Spleen:  Unremarkable Adrenals/Urinary Tract: 4.5 by 2.6 cm right mid kidney Bosniak category 2 cyst likely has a faint single internal septation. Adrenal glands normal. Stomach/Bowel: Unremarkable Vascular/Lymphatic:  Unremarkable Other:  No supplemental non-categorized findings. Musculoskeletal: Unremarkable IMPRESSION: 1. Debris or small gallstones fill the distal 3.5 cm of the CBD as shown on image 22/13. 2. Gallstones and debris are also present in the gallbladder. 3. Mild nonspecific periportal edema in the liver 4. Several small right hepatic lobe cysts. 5. Mild hepatic steatosis. 6. Benign right mid kidney cyst. Electronically Signed   By: Van Clines M.D.   On: 09/17/2016 14:28   US Abdomen Limited Ruq  Result Date: 09/16/2016 CLINICAL DATA:  Right upper quadrant abdominal pain. Diabetes. History of gallstones. Nausea and vomiting.  EXAM: ULTRASOUND ABDOMEN LIMITED RIGHT UPPER QUADRANT COMPARISON:  06/19/2016 FINDINGS: Gallbladder: Shadowing gallstones measuring up to 3.1 cm in diameter. Sludge observed in the gallbladder. Gallbladder wall thickness up to 4 mm. Sonographic Murphy's sign absent. Common bile duct: Diameter: 6 mm Liver: No focal lesion identified. Coarse echogenic liver with poor sonic penetration compatible with diffuse hepatic steatosis. IMPRESSION: 1. Cholelithiasis with gallbladder wall thickening. No pericholecystic fluid or sonographic Murphy sign. Correlate clinically in assessing for acute cholecystitis. 2. Coarse echogenic liver with poor sonic penetration compatible with diffuse hepatic steatosis. 3. Mild CBD dilatation and 6 mm, without directly visualized choledocholithiasis. Electronically Signed   By: Van Clines M.D.   On: 09/16/2016 21:22      IMPRESSION:   *  Symptomatic cholelithiasis and question of choledocholithiasis with elevated LFTs showing an improving trend within the last 24 hours.  *  Hepatic steatosis. Patient doesn't drink much so it is not alcoholic in nature.Marland Kitchen    PLAN:     *  ERCP Around 9 AM tomorrow. Risks and benefits as well as the logistics of the procedure itself discussed with the patient and he, along with his wife listened and were agreeable to proceed. [place] for him that right after the ERCP patient will under go his laparoscopic cholecystectomy.  *  Patient okay to have full liquids tonight but will be nothing by mouth after midnight for tomorrow's procedures and surgery   Azucena Freed  09/17/2016, 3:18 PM Pager: [place] Attending   I have taken an interval history, reviewed the chart and examined the patient. I agree with the Advanced Practitioner's note, impression and recommendations.   The risks and benefits as well as alternatives of endoscopic procedure(s) have been discussed and reviewed. All questions answered. The patient  agrees to proceed.  Gatha Mayer, MD, [facility] [phone] (pager) [phone] after 5 PM,  weekends and holidays  09/17/2016 6:58 PM

## 2016-09-18 NOTE — H&P (View-Only) (Signed)
Reason for Consult:Gallstones and abnormal LFTs Referring Physician: [name].  HPI: Known history of gallstones for abut a year, multiple episodes over the last three years.  This epoisode much worse, did not notice jaundice, but dark urine noticed.  Past Medical History:  Diagnosis Date  . Diabetes mellitus without complication ([place])   . Gallstone   . Hypertension     History reviewed. No pertinent surgical history.  Family History  Problem Relation Age of Onset  . Heart attack Mother   . Gallbladder disease Father   . Heart attack Father   . Heart attack Brother     Social History:  reports that he has been smoking.  He has never used smokeless tobacco. He reports that he drinks alcohol. He reports that he does not use drugs.  Allergies: No Known Allergies  Medications: I have reviewed the patient's current medications.  Results for orders placed or performed during the hospital encounter of 09/16/16 (from the past 48 hour(s))  Lipase, blood     Status: None   Collection Time: 09/16/16  6:54 PM  Result Value Ref Range   Lipase 25 11 - 51 U/L  Comprehensive metabolic panel     Status: Abnormal   Collection Time: 09/16/16  6:54 PM  Result Value Ref Range   Sodium 137 135 - 145 mmol/L   Potassium 3.7 3.5 - 5.1 mmol/L   Chloride 101 101 - 111 mmol/L   CO2 25 22 - 32 mmol/L   Glucose, Bld 109 (H) 65 - 99 mg/dL   BUN 7 6 - 20 mg/dL   Creatinine, Ser 1.28 (H) 0.61 - 1.24 mg/dL   Calcium 9.8 8.9 - 10.3 mg/dL   Total Protein 7.8 6.5 - 8.1 g/dL   Albumin 4.4 3.5 - 5.0 g/dL   AST 231 (H) 15 - 41 U/L   ALT 460 (H) 17 - 63 U/L   Alkaline Phosphatase 184 (H) 38 - 126 U/L   Total Bilirubin 4.3 (H) 0.3 - 1.2 mg/dL   GFR calc non Af Amer >60 >60 mL/min   GFR calc Af Amer >60 >60 mL/min    Comment: (NOTE) The eGFR has been calculated using the CKD EPI equation. This calculation has not been validated in all clinical situations. eGFR's  persistently <60 mL/min signify possible Chronic Kidney Disease.    Anion gap 11 5 - 15  CBC     Status: None   Collection Time: 09/16/16  6:54 PM  Result Value Ref Range   WBC 8.8 4.0 - 10.5 K/uL   RBC 5.11 4.22 - 5.81 MIL/uL   Hemoglobin 15.4 13.0 - 17.0 g/dL   HCT 45.5 39.0 - 52.0 %   MCV 89.0 78.0 - 100.0 fL   MCH 30.1 26.0 - 34.0 pg   MCHC 33.8 30.0 - 36.0 g/dL   RDW 13.3 11.5 - 15.5 %   Platelets 296 150 - 400 K/uL  Urinalysis, Routine w reflex microscopic     Status: Abnormal   Collection Time: 09/16/16  7:45 PM  Result Value Ref Range   Color, Urine AMBER (A) YELLOW    Comment: BIOCHEMICALS MAY BE AFFECTED BY COLOR   APPearance CLEAR CLEAR   Specific Gravity, Urine 1.011 1.005 - 1.030   pH 6.0 5.0 - 8.0   Glucose, UA NEGATIVE NEGATIVE mg/dL   Hgb urine dipstick NEGATIVE NEGATIVE   Bilirubin Urine NEGATIVE NEGATIVE   Ketones, ur NEGATIVE NEGATIVE mg/dL  Protein, ur NEGATIVE NEGATIVE mg/dL   Nitrite NEGATIVE NEGATIVE   Leukocytes, UA NEGATIVE NEGATIVE  Glucose, capillary     Status: Abnormal   Collection Time: 09/17/16 12:08 AM  Result Value Ref Range   Glucose-Capillary 101 (H) 65 - 99 mg/dL  Comprehensive metabolic panel     Status: Abnormal   Collection Time: 09/17/16  3:32 AM  Result Value Ref Range   Sodium 137 135 - 145 mmol/L   Potassium 3.3 (L) 3.5 - 5.1 mmol/L   Chloride 104 101 - 111 mmol/L   CO2 24 22 - 32 mmol/L   Glucose, Bld 159 (H) 65 - 99 mg/dL   BUN 7 6 - 20 mg/dL   Creatinine, Ser 1.23 0.61 - 1.24 mg/dL   Calcium 9.0 8.9 - 10.3 mg/dL   Total Protein 6.4 (L) 6.5 - 8.1 g/dL   Albumin 3.5 3.5 - 5.0 g/dL   AST 156 (H) 15 - 41 U/L   ALT 349 (H) 17 - 63 U/L   Alkaline Phosphatase 161 (H) 38 - 126 U/L   Total Bilirubin 3.6 (H) 0.3 - 1.2 mg/dL   GFR calc non Af Amer >60 >60 mL/min   GFR calc Af Amer >60 >60 mL/min    Comment: (NOTE) The eGFR has been calculated using the CKD EPI equation. This calculation has not been validated in all clinical  situations. eGFR's persistently <60 mL/min signify possible Chronic Kidney Disease.    Anion gap 9 5 - 15  CBC     Status: None   Collection Time: 09/17/16  3:32 AM  Result Value Ref Range   WBC 6.5 4.0 - 10.5 K/uL   RBC 4.39 4.22 - 5.81 MIL/uL   Hemoglobin 13.0 13.0 - 17.0 g/dL   HCT 39.0 39.0 - 52.0 %   MCV 88.8 78.0 - 100.0 fL   MCH 29.6 26.0 - 34.0 pg   MCHC 33.3 30.0 - 36.0 g/dL   RDW 13.2 11.5 - 15.5 %   Platelets 244 150 - 400 K/uL  Protime-INR     Status: None   Collection Time: 09/17/16  3:32 AM  Result Value Ref Range   Prothrombin Time 14.8 11.4 - 15.2 seconds   INR 1.15     US Abdomen Limited Ruq  Result Date: 09/16/2016 CLINICAL DATA:  Right upper quadrant abdominal pain. Diabetes. History of gallstones. Nausea and vomiting. EXAM: ULTRASOUND ABDOMEN LIMITED RIGHT UPPER QUADRANT COMPARISON:  06/19/2016 FINDINGS: Gallbladder: Shadowing gallstones measuring up to 3.1 cm in diameter. Sludge observed in the gallbladder. Gallbladder wall thickness up to 4 mm. Sonographic Murphy's sign absent. Common bile duct: Diameter: 6 mm Liver: No focal lesion identified. Coarse echogenic liver with poor sonic penetration compatible with diffuse hepatic steatosis. IMPRESSION: 1. Cholelithiasis with gallbladder wall thickening. No pericholecystic fluid or sonographic Murphy sign. Correlate clinically in assessing for acute cholecystitis. 2. Coarse echogenic liver with poor sonic penetration compatible with diffuse hepatic steatosis. 3. Mild CBD dilatation and 6 mm, without directly visualized choledocholithiasis. Electronically Signed   By: Van Clines M.D.   On: 09/16/2016 21:22    Review of Systems  Constitutional: Negative for chills and fever.  Cardiovascular: Negative for chest pain.  Gastrointestinal: Positive for abdominal pain, nausea and vomiting.  Skin:       jaundice  All other systems reviewed and are negative.  Blood pressure 119/69, pulse 79, temperature 97.8 F  (36.6 C), temperature source Oral, resp. rate 18, height '6\' 2"'$  (1.88 m), weight 98.3 kg (  216 lb 12.8 oz), SpO2 100 %. Physical Exam  Constitutional: He is oriented to person, place, and time. He appears well-developed and well-nourished.  HENT:  Head: Normocephalic and atraumatic.  Eyes: Conjunctivae and EOM are normal. Pupils are equal, round, and reactive to light. Scleral icterus is present.  Neck: Normal range of motion. Neck supple.  Cardiovascular: Normal rate and regular rhythm.   No murmur heard. Respiratory: Effort normal.  GI: Soft. Bowel sounds are normal. There is tenderness in the right upper quadrant and epigastric area. There is positive Murphy's sign.  Musculoskeletal: Normal range of motion.  Neurological: He is alert and oriented to person, place, and time. He has normal reflexes.  Skin: Skin is warm and dry.  Psychiatric: He has a normal mood and affect.    Assessment/Plan: Cholelithiasis with symptoms, and hyperbilirubinemia.  Scheduled for MRCP, possible ERCP Will need cholecystectomy eventually.  Brandon Hogan 09/17/2016, 6:52 AM

## 2016-09-18 NOTE — Op Note (Addendum)
[facility] Patient Name: Brandon Hogan Procedure Date : 09/18/2016 MRN: [id number] Attending MD: Iva Boop , MD Date of Birth: Sep 17, 1970 CSN: [id number] Age: 46 Admit Type: Inpatient Procedure:                ERCP Indications:              Suspected bile duct stone(s), For therapy of bile                            duct stone(s) Providers:                Iva Boop, MD, Tomma Rakers, RN, Harrington Challenger, Technician Referring MD:              Medicines:                General Anesthesia, Unasyn 1.5 g IV Complications:            No immediate complications. Estimated Blood Loss:     Estimated blood loss: none. Procedure:                Pre-Anesthesia Assessment:                           - Prior to the procedure, a History and Physical                            was performed, and patient medications and                            allergies were reviewed. The patient's tolerance of                            previous anesthesia was also reviewed. The risks                            and benefits of the procedure and the sedation                            options and risks were discussed with the patient.                            All questions were answered, and informed consent                            was obtained. Prior Anticoagulants: The patient has                            taken no previous anticoagulant or antiplatelet                            agents. ASA Grade Assessment: II - A patient with  mild systemic disease. After reviewing the risks                            and benefits, the patient was deemed in                            satisfactory condition to undergo the procedure.                           After obtaining informed consent, the scope was                            passed under direct vision. Throughout the                            procedure, the patient's blood  pressure, pulse, and                            oxygen saturations were monitored continuously. The                            Duodenoscope was introduced through the mouth, and                            used to inject contrast into and used to inject                            contrast into the bile duct. The ERCP was                            accomplished without difficulty. The patient                            tolerated the procedure well. Scope In: Scope Out: Findings:      The scout film was normal. Papilla in usual position and looked slightly       traumatized, draining some bile. Visualized portions of esophagus and       stomach and duodenum normal. Wire-guided cannulation of the main papilla       accomplished and deep cannulation of the bile duct also. Contrast       injected and mimimally dilated biliary tree extrahepatics and distal       filling defects seen. Gallbladder filled - small, large stone(s).       Biliary sphincterotomy performed with wire in place and bile and gravel       out. Balloon sweeps pulled out multiple dark and irregular stones and       fragments. Negative sweep and occlusion cholangiogram at end. Pancreas       not cannulated bu intent. Impression:               - Choledocholithiasis with a partial obstruction                            was found. Complete removal was accomplished by  biliary sphincterotomy and balloon extraction. Recommendation:           - remain in OR for lap chole                           f/u tomorrow Procedure Code(s):        --- Professional ---                           [phone], Endoscopic retrograde                            cholangiopancreatography (ERCP); diagnostic,                            including collection of specimen(s) by brushing or                            washing, when performed (separate procedure) Diagnosis Code(s):        --- Professional ---                            K80.51, Calculus of bile duct without cholangitis                            or cholecystitis with obstruction CPT copyright 2016 [employer]. All rights reserved. The codes documented in this report are preliminary and upon coder review may  be revised to meet current compliance requirements. Iva Boop, MD 09/18/2016 10:28:18 AM This report has been signed electronically. Number of Addenda: 0

## 2016-09-18 NOTE — Anesthesia Procedure Notes (Signed)
Procedure Name: Intubation Date/Time: 09/18/2016 9:09 AM Performed by: Coralee Rud Pre-anesthesia Checklist: Patient identified, Emergency Drugs available, Suction available and Patient being monitored Patient Re-evaluated:Patient Re-evaluated prior to inductionOxygen Delivery Method: Circle system utilized Preoxygenation: Pre-oxygenation with 100% oxygen Intubation Type: IV induction Ventilation: Mask ventilation without difficulty and Oral airway inserted - appropriate to patient size Laryngoscope Size: Miller and 3 Grade View: Grade II Tube type: Oral Tube size: 7.5 mm Number of attempts: 1 Airway Equipment and Method: Stylet Placement Confirmation: ETT inserted through vocal cords under direct vision,  positive ETCO2 and breath sounds checked- equal and bilateral Secured at: 22 cm Tube secured with: Tape Dental Injury: Teeth and Oropharynx as per pre-operative assessment

## 2016-09-18 NOTE — Interval H&P Note (Signed)
History and Physical Interval Note:  09/18/2016 8:38 AM  Brandon Hogan  has presented today for surgery, with the diagnosis of Cholecystits  The various methods of treatment have been discussed with the patient and family. After consideration of risks, benefits and other options for treatment, the patient has consented to  Procedure(s): LAPAROSCOPIC CHOLECYSTECTOMY WITH INTRAOPERATIVE CHOLANGIOGRAM (N/A) ENDOSCOPIC RETROGRADE CHOLANGIOPANCREATOGRAPHY (ERCP) (N/A) as a surgical intervention .  The patient's history has been reviewed, patient examined, no change in status, stable for surgery.  I have reviewed the patient's chart and labs.  Questions were answered to the patient's satisfaction.     Ladd Cen

## 2016-09-18 NOTE — Anesthesia Preprocedure Evaluation (Signed)
Anesthesia Evaluation  Patient identified by MRN, date of birth, ID band Patient awake    Reviewed: Allergy & Precautions, NPO status , Patient's Chart, lab work & pertinent test results  History of Anesthesia Complications Negative for: history of anesthetic complications  Airway Mallampati: II  TM Distance: >3 FB Neck ROM: Full    Dental  (+) Teeth Intact   Pulmonary Current Smoker,    breath sounds clear to auscultation       Cardiovascular hypertension, Pt. on medications (-) angina(-) Past MI and (-) CHF  Rhythm:Regular     Neuro/Psych negative neurological ROS  negative psych ROS   GI/Hepatic cholecystitis   Endo/Other  diabetes  Renal/GU      Musculoskeletal   Abdominal   Peds  Hematology negative hematology ROS (+)   Anesthesia Other Findings   Reproductive/Obstetrics                             Anesthesia Physical Anesthesia Plan  ASA: II  Anesthesia Plan: General   Post-op Pain Management:    Induction: Intravenous  PONV Risk Score and Plan: 1 and Ondansetron and Dexamethasone  Airway Management Planned: Oral ETT  Additional Equipment: None  Intra-op Plan:   Post-operative Plan: Extubation in OR  Informed Consent: I have reviewed the patients History and Physical, chart, labs and discussed the procedure including the risks, benefits and alternatives for the proposed anesthesia with the patient or authorized representative who has indicated his/her understanding and acceptance.   Dental advisory given  Plan Discussed with: CRNA and Surgeon  Anesthesia Plan Comments:         Anesthesia Quick Evaluation

## 2016-09-18 NOTE — Progress Notes (Signed)
PROGRESS NOTE    Brandon Hogan  AVW:[id number] DOB: 1970/11/21 DOA: 09/16/2016 PCP: Pearson Grippe, MD    Brief Narrative:  Patient is a 46 year old gentleman history of non-insulin-dependent diabetes mellitus, hypertension presenting with three-day history of right upper quadrant abdominal pain associated with nausea vomiting and worsening with food intake. Right upper quadrant ultrasound consistent with symptomatic cholelithiasis. MRCP done consistent with choledocholithiasis. Patient likely required ERCP and cholecystectomy. Gastroenterology and general surgery following.   Assessment & Plan:   Principal Problem:   Choledocholithiasis Active Problems:   Essential hypertension   Controlled type 2 diabetes mellitus without complication, without long-term current use of insulin (HCC)   Elevated LFTs   Symptomatic cholelithiasis   Hyperbilirubinemia   Right upper quadrant abdominal pain   RUQ abdominal pain   Cholelithiases  #1 choledocholithiasis/symptomatic cholelithiasis Patient presented with symptomatic cholelithiasis with right upper quadrant pain associated with food intake and noted to have a transaminitis. Right upper quadrant ultrasound consistent with cholelithiasis. LFTs trending down. MRCP with debris or small gallstones filling the distal 3.5 cm of the, bile duct. Gallstones and debris also present in the gallbladder. Mild hepatic steatosis. Patient scheduled for ERCP and subsequently laparoscopic cholecystectomy this morning. GI and general surgery following and appreciate input and recommendations.  #2 transaminitis Secondary to problem #1. LFTs Trending down.  #3 hypertension Stable. Continue home regimen of lisinopril HCTZ.  #4 diabetes mellitus type 2 Check a hemoglobin A1c. Hold oral hypoglycemic agents. Sliding scale insulin.  DVT prophylaxis: SCDs Code Status: Full Family Communication: Updated patient and family at bedside. Disposition Plan: Likely  home post ERCP and laparoscopic cholecystectomy and per general surgery and GI hopefully 1-2 days.    Consultants:   Gen. surgery: Dr. Lindie Spruce 09/17/2016  Gastroenterology: Dr. Leone Payor 09/17/2016  Procedures:   MRCP 09/17/2016  Right upper quadrant ultrasound 09/16/2016  MRCP 09/17/2016  ERCP pending 09/18/2016  Laparoscopic cholecystectomy pending 09/18/2016  Antimicrobials:   None   Subjective: Patient states abdominal pain improving since admission. Patient states had some full liquids yesterday with some abdominal pain. No nausea or emesis. No chest pain. No shortness of breath.   Objective: Vitals:   09/17/16 0528 09/17/16 1436 09/17/16 2215 09/18/16 0518  BP: 119/69 118/77 128/80 117/76  Pulse: 79 74 79 73  Resp: 18 18 18 18   Temp: 97.8 F (36.6 C) 98.1 F (36.7 C) 97.7 F (36.5 C) 98.1 F (36.7 C)  TempSrc: Oral Oral Oral Oral  SpO2: 100% 100% 99% 96%  Weight:      Height:        Intake/Output Summary (Last 24 hours) at 09/18/16 0753 Last data filed at 09/18/16 0519  Gross per 24 hour  Intake              660 ml  Output                0 ml  Net              660 ml   Filed Weights   09/17/16 0005  Weight: 98.3 kg (216 lb 12.8 oz)    Examination:  General exam: Appears calm and comfortable  Respiratory system: Clear to auscultation. Respiratory effort normal. Cardiovascular system: S1 & S2 heard, RRR. No JVD, murmurs, rubs, gallops or clicks. No pedal edema. Gastrointestinal system: Abdomen is nondistended, soft and less tender to palpation right upper quadrant. No organomegaly or masses felt. Normal bowel sounds heard. Central nervous system: Alert and oriented. No focal neurological  deficits. Extremities: Symmetric 5 x 5 power. Skin: No rashes, lesions or ulcers Psychiatry: Judgement and insight appear normal. Mood & affect appropriate.     Data Reviewed: I have personally reviewed following labs and imaging studies  CBC:  Recent  Labs Lab 09/16/16 1854 09/17/16 0332 09/18/16 0541  WBC 8.8 6.5 5.2  NEUTROABS  --   --  2.8  HGB 15.4 13.0 13.1  HCT 45.5 39.0 39.9  MCV 89.0 88.8 89.7  PLT 296 244 236   Basic Metabolic Panel:  Recent Labs Lab 09/16/16 1854 09/17/16 0332 09/18/16 0541  NA 137 137 139  K 3.7 3.3* 4.0  CL 101 104 106  CO2 25 24 26   GLUCOSE 109* 159* 91  BUN 7 7 7   CREATININE 1.28* 1.23 1.26*  CALCIUM 9.8 9.0 9.5  MG  --  1.9 1.9   GFR: Estimated Creatinine Clearance: 87 mL/min (A) (by C-G formula based on SCr of 1.26 mg/dL (H)). Liver Function Tests:  Recent Labs Lab 09/16/16 1854 09/17/16 0332 09/18/16 0541  AST 231* 156* 114*  ALT 460* 349* 290*  ALKPHOS 184* 161* 179*  BILITOT 4.3* 3.6* 3.5*  PROT 7.8 6.4* 6.1*  ALBUMIN 4.4 3.5 3.4*    Recent Labs Lab 09/16/16 1854  LIPASE 25   No results for input(s): AMMONIA in the last 168 hours. Coagulation Profile:  Recent Labs Lab 09/17/16 0332 09/18/16 0541  INR 1.15 1.06   Cardiac Enzymes: No results for input(s): CKTOTAL, CKMB, CKMBINDEX, TROPONINI in the last 168 hours. BNP (last 3 results) No results for input(s): PROBNP in the last 8760 hours. HbA1C: No results for input(s): HGBA1C in the last 72 hours. CBG:  Recent Labs Lab 09/17/16 0008 09/17/16 0737 09/17/16 1158 09/17/16 1704 09/17/16 2220  GLUCAP 101* 110* 103* 104* 125*   Lipid Profile: No results for input(s): CHOL, HDL, LDLCALC, TRIG, CHOLHDL, LDLDIRECT in the last 72 hours. Thyroid Function Tests: No results for input(s): TSH, T4TOTAL, FREET4, T3FREE, THYROIDAB in the last 72 hours. Anemia Panel: No results for input(s): VITAMINB12, FOLATE, FERRITIN, TIBC, IRON, RETICCTPCT in the last 72 hours. Sepsis Labs: No results for input(s): PROCALCITON, LATICACIDVEN in the last 168 hours.  No results found for this or any previous visit (from the past 240 hour(s)).       Radiology Studies: Mr 3d Recon At Scanner  Result Date:  09/17/2016 CLINICAL DATA:  Right upper quadrant abdominal pain for 3 years. Progressively worsening over the past 4 days. EXAM: MRI ABDOMEN WITHOUT AND WITH CONTRAST (INCLUDING MRCP) TECHNIQUE: Multiplanar multisequence MR imaging of the abdomen was performed both before and after the administration of intravenous contrast. Heavily T2-weighted images of the biliary and pancreatic ducts were obtained, and three-dimensional MRCP images were rendered by post processing. CONTRAST:  19 cc MULTIHANCE GADOBENATE DIMEGLUMINE 529 MG/ML IV SOLN COMPARISON:  Abdominal ultrasound 09/16/2016 and abdominal MRI from 11/06/2015 FINDINGS: Despite efforts by the technologist and patient, motion artifact is present on today's exam and could not be eliminated. This reduces exam sensitivity and specificity. Lower chest: Unremarkable Hepatobiliary: Multiple gallstones in the gallbladder measuring up to 2.8 cm in length. Mild periportal edema in the liver. 7 mm cyst peripherally in the right hepatic lobe on image 18/9 with similar 4 mm fluid signal intensity lesion. There is likely debris or sludge in the gallbladder fundus. Common bile duct measures up to 8 mm in diameter. The distal 3.5 cm of the CBD appears to be filled with debris or small stones,  as on image 22/13. Faint accentuated mucosal thickening in this portion of the CBD 8 as on image 22/13. Mild hepatic steatosis. Pancreas:  Unremarkable Spleen:  Unremarkable Adrenals/Urinary Tract: 4.5 by 2.6 cm right mid kidney Bosniak category 2 cyst likely has a faint single internal septation. Adrenal glands normal. Stomach/Bowel: Unremarkable Vascular/Lymphatic:  Unremarkable Other:  No supplemental non-categorized findings. Musculoskeletal: Unremarkable IMPRESSION: 1. Debris or small gallstones fill the distal 3.5 cm of the CBD as shown on image 22/13. 2. Gallstones and debris are also present in the gallbladder. 3. Mild nonspecific periportal edema in the liver 4. Several small right  hepatic lobe cysts. 5. Mild hepatic steatosis. 6. Benign right mid kidney cyst. Electronically Signed   By: Gaylyn Rong M.D.   On: 09/17/2016 14:28   Mr Abdomen Mrcp Vivien Rossetti Contast  Result Date: 09/17/2016 CLINICAL DATA:  Right upper quadrant abdominal pain for 3 years. Progressively worsening over the past 4 days. EXAM: MRI ABDOMEN WITHOUT AND WITH CONTRAST (INCLUDING MRCP) TECHNIQUE: Multiplanar multisequence MR imaging of the abdomen was performed both before and after the administration of intravenous contrast. Heavily T2-weighted images of the biliary and pancreatic ducts were obtained, and three-dimensional MRCP images were rendered by post processing. CONTRAST:  19 cc MULTIHANCE GADOBENATE DIMEGLUMINE 529 MG/ML IV SOLN COMPARISON:  Abdominal ultrasound 09/16/2016 and abdominal MRI from 11/06/2015 FINDINGS: Despite efforts by the technologist and patient, motion artifact is present on today's exam and could not be eliminated. This reduces exam sensitivity and specificity. Lower chest: Unremarkable Hepatobiliary: Multiple gallstones in the gallbladder measuring up to 2.8 cm in length. Mild periportal edema in the liver. 7 mm cyst peripherally in the right hepatic lobe on image 18/9 with similar 4 mm fluid signal intensity lesion. There is likely debris or sludge in the gallbladder fundus. Common bile duct measures up to 8 mm in diameter. The distal 3.5 cm of the CBD appears to be filled with debris or small stones, as on image 22/13. Faint accentuated mucosal thickening in this portion of the CBD 8 as on image 22/13. Mild hepatic steatosis. Pancreas:  Unremarkable Spleen:  Unremarkable Adrenals/Urinary Tract: 4.5 by 2.6 cm right mid kidney Bosniak category 2 cyst likely has a faint single internal septation. Adrenal glands normal. Stomach/Bowel: Unremarkable Vascular/Lymphatic:  Unremarkable Other:  No supplemental non-categorized findings. Musculoskeletal: Unremarkable IMPRESSION: 1. Debris or small  gallstones fill the distal 3.5 cm of the CBD as shown on image 22/13. 2. Gallstones and debris are also present in the gallbladder. 3. Mild nonspecific periportal edema in the liver 4. Several small right hepatic lobe cysts. 5. Mild hepatic steatosis. 6. Benign right mid kidney cyst. Electronically Signed   By: Gaylyn Rong M.D.   On: 09/17/2016 14:28   US Abdomen Limited Ruq  Result Date: 09/16/2016 CLINICAL DATA:  Right upper quadrant abdominal pain. Diabetes. History of gallstones. Nausea and vomiting. EXAM: ULTRASOUND ABDOMEN LIMITED RIGHT UPPER QUADRANT COMPARISON:  06/19/2016 FINDINGS: Gallbladder: Shadowing gallstones measuring up to 3.1 cm in diameter. Sludge observed in the gallbladder. Gallbladder wall thickness up to 4 mm. Sonographic Murphy's sign absent. Common bile duct: Diameter: 6 mm Liver: No focal lesion identified. Coarse echogenic liver with poor sonic penetration compatible with diffuse hepatic steatosis. IMPRESSION: 1. Cholelithiasis with gallbladder wall thickening. No pericholecystic fluid or sonographic Murphy sign. Correlate clinically in assessing for acute cholecystitis. 2. Coarse echogenic liver with poor sonic penetration compatible with diffuse hepatic steatosis. 3. Mild CBD dilatation and 6 mm, without directly visualized choledocholithiasis. Electronically  Signed   By: Gaylyn Rong M.D.   On: 09/16/2016 21:22        Scheduled Meds: . allopurinol  100 mg Oral BID  . aspirin EC  81 mg Oral Daily  . lisinopril  10 mg Oral Daily   And  . hydrochlorothiazide  12.5 mg Oral Daily  . indomethacin  100 mg Rectal Once  . insulin aspart  0-5 Units Subcutaneous QHS  . insulin aspart  0-9 Units Subcutaneous TID WC   Continuous Infusions: . sodium chloride 100 mL/hr at 09/17/16 2250  . ampicillin-sulbactam (UNASYN) IVPB 3 g    . cefTRIAXone (ROCEPHIN)  IV       LOS: 1 day    Time spent: 35 minutes    THOMPSON,DANIEL, MD [facility] Pager  [phone]  If 7PM-7AM, please contact night-coverage [email or web address] Password [facility] 09/18/2016, 7:53 AM

## 2016-09-18 NOTE — Interval H&P Note (Signed)
History and Physical Interval Note:  09/18/2016 8:56 AM  Brandon Hogan  has presented today for surgery, with the diagnosis of Cholecystits  The various methods of treatment have been discussed with the patient and family. After consideration of risks, benefits and other options for treatment, the patient has consented to  Procedure(s): LAPAROSCOPIC CHOLECYSTECTOMY WITH INTRAOPERATIVE CHOLANGIOGRAM (N/A) ENDOSCOPIC RETROGRADE CHOLANGIOPANCREATOGRAPHY (ERCP) (N/A) as a surgical intervention .  The patient's history has been reviewed, patient examined, no change in status, stable for surgery.  I have reviewed the patient's chart and labs.  Questions were answered to the patient's satisfaction.     Stan Head

## 2016-09-19 ENCOUNTER — Encounter (HOSPITAL_COMMUNITY): Payer: Self-pay | Admitting: General Surgery

## 2016-09-19 LAB — GLUCOSE, CAPILLARY
GLUCOSE-CAPILLARY: 104 mg/dL — AB (ref 65–99)
GLUCOSE-CAPILLARY: 105 mg/dL — AB (ref 65–99)
Glucose-Capillary: 86 mg/dL (ref 65–99)
Glucose-Capillary: 65 mg/dL (ref 65–99)
Glucose-Capillary: 81 mg/dL (ref 65–99)

## 2016-09-19 LAB — AMYLASE: AMYLASE: 412 U/L — AB (ref 28–100)

## 2016-09-19 LAB — COMPREHENSIVE METABOLIC PANEL
ALBUMIN: 3.5 g/dL (ref 3.5–5.0)
ALK PHOS: 187 U/L — AB (ref 38–126)
ALT: 246 U/L — AB (ref 17–63)
AST: 96 U/L — ABNORMAL HIGH (ref 15–41)
Anion gap: 10 (ref 5–15)
BUN: 10 mg/dL (ref 6–20)
CALCIUM: 9.1 mg/dL (ref 8.9–10.3)
CHLORIDE: 102 mmol/L (ref 101–111)
CO2: 26 mmol/L (ref 22–32)
CREATININE: 1.22 mg/dL (ref 0.61–1.24)
GFR calc Af Amer: >60 mL/min (ref >60)
GFR calc non Af Amer: >60 mL/min (ref >60)
GLUCOSE: 83 mg/dL (ref 65–99)
Potassium: 3.8 mmol/L (ref 3.5–5.1)
SODIUM: 138 mmol/L (ref 135–145)
Total Bilirubin: 1.5 mg/dL — ABNORMAL HIGH (ref 0.3–1.2)
Total Protein: 6.5 g/dL (ref 6.5–8.1)

## 2016-09-19 LAB — CBC WITH DIFFERENTIAL/PLATELET
BASOS PCT: 0 %
Basophils Absolute: 0 10*3/uL (ref 0.0–0.1)
Eosinophils Absolute: 0.1 10*3/uL (ref 0.0–0.7)
Eosinophils Relative: 1 %
HEMATOCRIT: 43.8 % (ref 39.0–52.0)
HEMOGLOBIN: 14.6 g/dL (ref 13.0–17.0)
LYMPHS ABS: 1.6 10*3/uL (ref 0.7–4.0)
Lymphocytes Relative: 14 %
MCH: 30 pg (ref 26.0–34.0)
MCHC: 33.3 g/dL (ref 30.0–36.0)
MCV: 89.9 fL (ref 78.0–100.0)
MONO ABS: 0.8 10*3/uL (ref 0.1–1.0)
MONOS PCT: 7 %
Neutro Abs: 8.9 10*3/uL — ABNORMAL HIGH (ref 1.7–7.7)
Neutrophils Relative %: 78 %
Platelets: 260 10*3/uL (ref 150–400)
RBC: 4.87 MIL/uL (ref 4.22–5.81)
RDW: 13.3 % (ref 11.5–15.5)
WBC: 11.4 10*3/uL — ABNORMAL HIGH (ref 4.0–10.5)

## 2016-09-19 LAB — LIPASE, BLOOD: Lipase: 315 U/L — ABNORMAL HIGH (ref 11–51)

## 2016-09-19 MED ORDER — PROCHLORPERAZINE EDISYLATE 5 MG/ML IJ SOLN
10.0000 mg | Freq: Four times a day (QID) | INTRAMUSCULAR | Status: DC | PRN
  Administered 2016-09-19: 10 mg via INTRAVENOUS
  Filled 2016-09-19: qty 2

## 2016-09-19 MED ORDER — SIMETHICONE 80 MG PO CHEW
160.0000 mg | CHEWABLE_TABLET | Freq: Four times a day (QID) | ORAL | Status: DC
  Administered 2016-09-19 – 2016-09-20 (×3): 160 mg via ORAL
  Filled 2016-09-19 (×3): qty 2

## 2016-09-19 MED ORDER — OXYCODONE HCL 5 MG PO TABS: 5.0000 mg | ORAL_TABLET | ORAL | 0 refills | Status: DC | PRN

## 2016-09-19 MED ORDER — SENNOSIDES-DOCUSATE SODIUM 8.6-50 MG PO TABS
1.0000 | ORAL_TABLET | Freq: Two times a day (BID) | ORAL | Status: DC
  Administered 2016-09-19 – 2016-09-20 (×2): 1 via ORAL
  Filled 2016-09-19 (×2): qty 1

## 2016-09-19 MED ORDER — HYDROMORPHONE HCL 1 MG/ML IJ SOLN
0.5000 mg | INTRAMUSCULAR | Status: DC | PRN
  Administered 2016-09-19: 0.5 mg via INTRAVENOUS
  Filled 2016-09-19: qty 1

## 2016-09-19 MED ORDER — OXYCODONE HCL 5 MG PO TABS
5.0000 mg | ORAL_TABLET | ORAL | Status: DC | PRN
  Administered 2016-09-19 – 2016-09-20 (×2): 10 mg via ORAL
  Administered 2016-09-20: 5 mg via ORAL
  Filled 2016-09-19 (×3): qty 2
  Filled 2016-09-19: qty 1

## 2016-09-19 NOTE — Progress Notes (Signed)
1 Day Post-Op   Subjective/Chief Complaint: Still pretty sore.  Has done OK with clears.  Having occasional hiccups.     Objective: Vital signs in last 24 hours: Temp:  [97.6 F (36.4 C)-98.3 F (36.8 C)] 97.6 F (36.4 C) (06/16 0436) Pulse Rate:  [64-99] 94 (06/16 0537) Resp:  [12-18] 18 (06/16 0436) BP: (133-166)/(78-102) 141/87 (06/16 0537) SpO2:  [93 %-100 %] 98 % (06/16 0436) Last BM Date: 09/17/16  Intake/Output from previous day: 06/15 0701 - 06/16 0700 In: 2198.3 [P.O.:180; I.V.:2018.3] Out: 1900 [Urine:1900] Intake/Output this shift: No intake/output data recorded.  General appearance: sleepy, but easily arousable and cooperative.  Mild distress Resp: breathing comfortably GI: soft, approp tender, sl distended. Scleral icterus improving.  Lab Results:   Recent Labs  09/18/16 0541 09/19/16 0505  WBC 5.2 11.4*  HGB 13.1 14.6  HCT 39.9 43.8  PLT 236 260   BMET  Recent Labs  09/18/16 0541 09/19/16 0505  NA 139 138  K 4.0 3.8  CL 106 102  CO2 26 26  GLUCOSE 91 83  BUN 7 10  CREATININE 1.26* 1.22  CALCIUM 9.5 9.1   PT/INR  Recent Labs  09/17/16 0332 09/18/16 0541  LABPROT 14.8 13.8  INR 1.15 1.06   ABG No results for input(s): PHART, HCO3 in the last 72 hours.  Invalid input(s): PCO2, PO2  Studies/Results: Mr 3d Recon At Scanner  Result Date: 09/17/2016 CLINICAL DATA:  Right upper quadrant abdominal pain for 3 years. Progressively worsening over the past 4 days. EXAM: MRI ABDOMEN WITHOUT AND WITH CONTRAST (INCLUDING MRCP) TECHNIQUE: Multiplanar multisequence MR imaging of the abdomen was performed both before and after the administration of intravenous contrast. Heavily T2-weighted images of the biliary and pancreatic ducts were obtained, and three-dimensional MRCP images were rendered by post processing. CONTRAST:  19 cc MULTIHANCE GADOBENATE DIMEGLUMINE 529 MG/ML IV SOLN COMPARISON:  Abdominal ultrasound 09/16/2016 and abdominal MRI from  11/06/2015 FINDINGS: Despite efforts by the technologist and patient, motion artifact is present on today's exam and could not be eliminated. This reduces exam sensitivity and specificity. Lower chest: Unremarkable Hepatobiliary: Multiple gallstones in the gallbladder measuring up to 2.8 cm in length. Mild periportal edema in the liver. 7 mm cyst peripherally in the right hepatic lobe on image 18/9 with similar 4 mm fluid signal intensity lesion. There is likely debris or sludge in the gallbladder fundus. Common bile duct measures up to 8 mm in diameter. The distal 3.5 cm of the CBD appears to be filled with debris or small stones, as on image 22/13. Faint accentuated mucosal thickening in this portion of the CBD 8 as on image 22/13. Mild hepatic steatosis. Pancreas:  Unremarkable Spleen:  Unremarkable Adrenals/Urinary Tract: 4.5 by 2.6 cm right mid kidney Bosniak category 2 cyst likely has a faint single internal septation. Adrenal glands normal. Stomach/Bowel: Unremarkable Vascular/Lymphatic:  Unremarkable Other:  No supplemental non-categorized findings. Musculoskeletal: Unremarkable IMPRESSION: 1. Debris or small gallstones fill the distal 3.5 cm of the CBD as shown on image 22/13. 2. Gallstones and debris are also present in the gallbladder. 3. Mild nonspecific periportal edema in the liver 4. Several small right hepatic lobe cysts. 5. Mild hepatic steatosis. 6. Benign right mid kidney cyst. Electronically Signed   By: Gaylyn Rong M.D.   On: 09/17/2016 14:28   Dg Ercp Biliary & Pancreatic Ducts  Result Date: 09/18/2016 CLINICAL DATA:  46 year old male with a history of choledocholithiasis EXAM: ERCP TECHNIQUE: Multiple spot images obtained with the  fluoroscopic device and submitted for interpretation post-procedure. FLUOROSCOPY TIME:  Fluoroscopy Time:  170 seconds COMPARISON:  MRI 09/17/2016 FINDINGS: Multiple intraoperative fluoroscopic spot images of ERCP. Initial image demonstrates endoscope  projecting over the upper abdomen with cannulation of the ampulla and retrograde contrast infusion. Contrast partially opacifies the extrahepatic biliary system including common hepatic duct, common bile duct, cystic duct, and the gallbladder. Filling defects within the gallbladder lumen. Evaluation for filling defects within the extrahepatic ductal system is limited. IMPRESSION: Limited images during ERCP demonstrates cholelithiasis, with decreased sensitivity of the examination for the detection of choledocholithiasis. Please refer to the dictated operative report for full details of intraoperative findings and procedure. Electronically Signed   By: Gilmer Mor D.O.   On: 09/18/2016 11:15   Mr Abdomen Mrcp Vivien Rossetti Contast  Result Date: 09/17/2016 CLINICAL DATA:  Right upper quadrant abdominal pain for 3 years. Progressively worsening over the past 4 days. EXAM: MRI ABDOMEN WITHOUT AND WITH CONTRAST (INCLUDING MRCP) TECHNIQUE: Multiplanar multisequence MR imaging of the abdomen was performed both before and after the administration of intravenous contrast. Heavily T2-weighted images of the biliary and pancreatic ducts were obtained, and three-dimensional MRCP images were rendered by post processing. CONTRAST:  19 cc MULTIHANCE GADOBENATE DIMEGLUMINE 529 MG/ML IV SOLN COMPARISON:  Abdominal ultrasound 09/16/2016 and abdominal MRI from 11/06/2015 FINDINGS: Despite efforts by the technologist and patient, motion artifact is present on today's exam and could not be eliminated. This reduces exam sensitivity and specificity. Lower chest: Unremarkable Hepatobiliary: Multiple gallstones in the gallbladder measuring up to 2.8 cm in length. Mild periportal edema in the liver. 7 mm cyst peripherally in the right hepatic lobe on image 18/9 with similar 4 mm fluid signal intensity lesion. There is likely debris or sludge in the gallbladder fundus. Common bile duct measures up to 8 mm in diameter. The distal 3.5 cm of the CBD  appears to be filled with debris or small stones, as on image 22/13. Faint accentuated mucosal thickening in this portion of the CBD 8 as on image 22/13. Mild hepatic steatosis. Pancreas:  Unremarkable Spleen:  Unremarkable Adrenals/Urinary Tract: 4.5 by 2.6 cm right mid kidney Bosniak category 2 cyst likely has a faint single internal septation. Adrenal glands normal. Stomach/Bowel: Unremarkable Vascular/Lymphatic:  Unremarkable Other:  No supplemental non-categorized findings. Musculoskeletal: Unremarkable IMPRESSION: 1. Debris or small gallstones fill the distal 3.5 cm of the CBD as shown on image 22/13. 2. Gallstones and debris are also present in the gallbladder. 3. Mild nonspecific periportal edema in the liver 4. Several small right hepatic lobe cysts. 5. Mild hepatic steatosis. 6. Benign right mid kidney cyst. Electronically Signed   By: Gaylyn Rong M.D.   On: 09/17/2016 14:28    Anti-infectives: Anti-infectives    Start     Dose/Rate Route Frequency Ordered Stop   09/18/16 0900  cefTRIAXone (ROCEPHIN) 2 g in dextrose 5 % 50 mL IVPB    Comments:  Pharmacy may adjust dosing strength / duration / interval for maximal efficacy   2 g 100 mL/hr over 30 Minutes Intravenous To Surgery 09/17/16 1706 09/19/16 0900   09/17/16 2100  Ampicillin-Sulbactam (UNASYN) 3 g in sodium chloride 0.9 % 100 mL IVPB     3 g 200 mL/hr over 30 Minutes Intravenous  Once 09/17/16 2053 09/18/16 0925   09/17/16 1700  cefTRIAXone (ROCEPHIN) 2 g in dextrose 5 % 50 mL IVPB  Status:  Discontinued    Comments:  Pharmacy may adjust dosing strength / duration /  interval for maximal efficacy   2 g 100 mL/hr over 30 Minutes Intravenous On call 09/17/16 1624 09/17/16 1743      Assessment/Plan: s/p Procedure(s): LAPAROSCOPIC CHOLECYSTECTOMY (N/A) ENDOSCOPIC RETROGRADE CHOLANGIOPANCREATOGRAPHY (ERCP) (N/A) labs improved, bili coming down.    Advancing diet.   Ok for d/c later today if doing OK with diet.   Left  follow up information on chart.     LOS: 2 days    [facility] 09/19/2016

## 2016-09-19 NOTE — Discharge Instructions (Signed)
[facility] ______[facility] °LAPAROSCOPIC SURGERY: POST OP INSTRUCTIONS °Always review your discharge instruction sheet given to you by the facility where your surgery was performed. °IF YOU HAVE DISABILITY OR FAMILY LEAVE FORMS, YOU MUST BRING THEM TO THE OFFICE FOR PROCESSING.   °DO NOT GIVE THEM TO YOUR DOCTOR. ° °1. A prescription for pain medication may be given to you upon discharge.  Take your pain medication as prescribed, if needed.  If narcotic pain medicine is not needed, then you may take acetaminophen (Tylenol) or ibuprofen (Advil) as needed. °2. Take your usually prescribed medications unless otherwise directed. °3. If you need a refill on your pain medication, please contact your pharmacy.  They will contact our office to request authorization. Prescriptions will not be filled after 5pm or on week-ends. °4. You should follow a light diet the first few days after arrival home, such as soup and crackers, etc.  Be sure to include lots of fluids daily. °5. Most patients will experience some swelling and bruising in the area of the incisions.  Ice packs will help.  Swelling and bruising can take several days to resolve.  °6. It is common to experience some constipation if taking pain medication after surgery.  Increasing fluid intake and taking a stool softener (such as Colace) will usually help or prevent this problem from occurring.  A mild laxative (Milk of Magnesia or Miralax) should be taken according to package instructions if there are no bowel movements after 48 hours. °7. Unless discharge instructions indicate otherwise, you may remove your bandages 24-48 hours after surgery, and you may shower at that time.  You may have steri-strips (small skin tapes) in place directly over the incision.  These strips should be left on the skin for 7-10 days.  If your surgeon used skin glue on the incision, you may shower in 24 hours.  The glue will flake off over the next 2-3 weeks.  Any sutures or  staples will be removed at the office during your follow-up visit. °8. ACTIVITIES:  You may resume regular (light) daily activities beginning the next day--such as daily self-care, walking, climbing stairs--gradually increasing activities as tolerated.  You may have sexual intercourse when it is comfortable.  Refrain from any heavy lifting or straining until approved by your doctor. °a. You may drive when you are no longer taking prescription pain medication, you can comfortably wear a seatbelt, and you can safely maneuver your car and apply brakes. °b. RETURN TO WORK:  __________________________________________________________ °9. You should see your doctor in the office for a follow-up appointment approximately 2-3 weeks after your surgery.  Make sure that you call for this appointment within a day or two after you arrive home to insure a convenient appointment time. °10. OTHER INSTRUCTIONS: __________________________________________________________________________________________________________________________ __________________________________________________________________________________________________________________________ °WHEN TO CALL YOUR DOCTOR: °1. Fever over 101.0 °2. Inability to urinate °3. Continued bleeding from incision. °4. Increased pain, redness, or drainage from the incision. °5. Increasing abdominal pain ° °The clinic staff is available to answer your questions during regular business hours.  Please don’t hesitate to call and ask to speak to one of the nurses for clinical concerns.  If you have a medical emergency, go to the nearest emergency room or call 911.  A surgeon from [facility] is always on call at the hospital. °[address], [address], [place], [place]  [address] ? [address] [address], [place],    [address] °[phone] ? [phone] ? FAX [phone] °Web site:   [email or web address] °

## 2016-09-19 NOTE — Progress Notes (Signed)
PROGRESS NOTE    Brandon Hogan  UEA:[id number] DOB: 1970-08-12 DOA: 09/16/2016 PCP: Pearson Grippe, MD    Brief Narrative:  Patient is a 46 year old gentleman history of non-insulin-dependent diabetes mellitus, hypertension presenting with three-day history of right upper quadrant abdominal pain associated with nausea vomiting and worsening with food intake. Right upper quadrant ultrasound consistent with symptomatic cholelithiasis. MRCP done consistent with choledocholithiasis. Patient subsequently underwent ERCP and cholecystectomy on 09/18/2016. Gastroenterology and general surgery following.   Assessment & Plan:   Principal Problem:   Choledocholithiasis Active Problems:   Essential hypertension   Controlled type 2 diabetes mellitus without complication, without long-term current use of insulin (HCC)   Elevated LFTs   Symptomatic cholelithiasis   Hyperbilirubinemia   Right upper quadrant abdominal pain   RUQ abdominal pain   Cholelithiases  #1 choledocholithiasis with partial obstruction /symptomatic cholelithiasis Patient presented with symptomatic cholelithiasis with right upper quadrant pain associated with food intake and noted to have a transaminitis. Right upper quadrant ultrasound consistent with cholelithiasis. LFTs trending down. MRCP with debris or small gallstones filling the distal 3.5 cm of the, bile duct. Gallstones and debris also present in the gallbladder. Mild hepatic steatosis. Patient s/p ERCP and subsequently laparoscopic cholecystectomy 09/18/2016. ERCP done 09/18/2016 with choledocholithiasis with partial obstruction. Complete removal was accomplished by biliary sphincterotomy and balloon extraction per Dr. Leone Payor. Patient status post laparoscopic cholecystectomy per Dr. Donell Beers with no complications. Patient noted to have elevated lipase this morning and concern for post ERCP pancreatitis however unable to discern. Patient's IV fluids have been increased to  LR 200 mL/h per gastroenterology. Patient currently has in place in the full liquid diet per general surgery. Monitor. Decrease Dilaudid from every 2 hours to every 4 hours as needed. Supportive care. Follow. GI and general surgery following and appreciate input and recommendations.  #2 transaminitis Secondary to problem #1. LFTs Trending down post ERCP and laparoscopic cholecystectomy.  #3 hypertension Stable. Continue home regimen of lisinopril HCTZ.  #4 diabetes mellitus type 2 Hemoglobin A1c pending. Hold oral hypoglycemic agents. Sliding scale insulin.  DVT prophylaxis: SCDs Code Status: Full Family Communication: Updated patient and family at bedside. Disposition Plan: Likely home post ERCP and laparoscopic cholecystectomy and per general surgery and GI hopefully 1-2 days.    Consultants:   Gen. surgery: Dr. Lindie Spruce 09/17/2016  Gastroenterology: Dr. Leone Payor 09/17/2016  Procedures:   MRCP 09/17/2016  Right upper quadrant ultrasound 09/16/2016  MRCP 09/17/2016  ERCP 09/18/2016  Laparoscopic cholecystectomy 09/18/2016  Antimicrobials:   None   Subjective: Patient laying in bed somewhat sluggish. Patient denies any flatus. No bowel movement. Patient states he feels bloated. Tolerating current liquids. No emesis no nausea. Patient states feels a pressure in the abdomen have abdominal pain improved. Patient also has just received IV pain medication.   Objective: Vitals:   09/18/16 2050 09/19/16 0208 09/19/16 0436 09/19/16 0537  BP: 139/83 140/85 (!) 166/102 (!) 141/87  Pulse: 88 87 99 94  Resp: 18 18 18    Temp: 98.3 F (36.8 C) 98.1 F (36.7 C) 97.6 F (36.4 C)   TempSrc: Oral Oral Oral   SpO2: 98% 98% 98%   Weight:      Height:        Intake/Output Summary (Last 24 hours) at 09/19/16 1346 Last data filed at 09/19/16 1225  Gross per 24 hour  Intake          1478.33 ml  Output  2600 ml  Net         -1121.67 ml   Filed Weights   09/17/16 0005    Weight: 98.3 kg (216 lb 12.8 oz)    Examination:  General exam: Appears calm and comfortable  Respiratory system: Clear to auscultation. Respiratory effort normal. Cardiovascular system: S1 & S2 heard, RRR. No JVD, murmurs, rubs, gallops or clicks. No pedal edema. Gastrointestinal system: Abdomen is mildly distended, soft and less tender to palpation right upper quadrant. No organomegaly or masses felt. Normal bowel sounds heard. Central nervous system: sluggish. No focal neurological deficits. Extremities: Symmetric 5 x 5 power. Skin: No rashes, lesions or ulcers Psychiatry: Judgement and insight appear normal. Mood & affect appropriate.     Data Reviewed: I have personally reviewed following labs and imaging studies  CBC:  Recent Labs Lab 09/16/16 1854 09/17/16 0332 09/18/16 0541 09/19/16 0505  WBC 8.8 6.5 5.2 11.4*  NEUTROABS  --   --  2.8 8.9*  HGB 15.4 13.0 13.1 14.6  HCT 45.5 39.0 39.9 43.8  MCV 89.0 88.8 89.7 89.9  PLT 296 244 236 260   Basic Metabolic Panel:  Recent Labs Lab 09/16/16 1854 09/17/16 0332 09/18/16 0541 09/19/16 0505  NA 137 137 139 138  K 3.7 3.3* 4.0 3.8  CL 101 104 106 102  CO2 25 24 26 26   GLUCOSE 109* 159* 91 83  BUN 7 7 7 10   CREATININE 1.28* 1.23 1.26* 1.22  CALCIUM 9.8 9.0 9.5 9.1  MG  --  1.9 1.9  --    GFR: Estimated Creatinine Clearance: 89.8 mL/min (by C-G formula based on SCr of 1.22 mg/dL). Liver Function Tests:  Recent Labs Lab 09/16/16 1854 09/17/16 0332 09/18/16 0541 09/19/16 0505  AST 231* 156* 114* 96*  ALT 460* 349* 290* 246*  ALKPHOS 184* 161* 179* 187*  BILITOT 4.3* 3.6* 3.5* 1.5*  PROT 7.8 6.4* 6.1* 6.5  ALBUMIN 4.4 3.5 3.4* 3.5    Recent Labs Lab 09/16/16 1854 09/19/16 0505  LIPASE 25 315*  AMYLASE  --  412*   No results for input(s): AMMONIA in the last 168 hours. Coagulation Profile:  Recent Labs Lab 09/17/16 0332 09/18/16 0541  INR 1.15 1.06   Cardiac Enzymes: No results for  input(s): CKTOTAL, CKMB, CKMBINDEX, TROPONINI in the last 168 hours. BNP (last 3 results) No results for input(s): PROBNP in the last 8760 hours. HbA1C: No results for input(s): HGBA1C in the last 72 hours. CBG:  Recent Labs Lab 09/18/16 1119 09/18/16 1654 09/18/16 2054 09/19/16 0754 09/19/16 1214  GLUCAP 90 86 85 86 65   Lipid Profile: No results for input(s): CHOL, HDL, LDLCALC, TRIG, CHOLHDL, LDLDIRECT in the last 72 hours. Thyroid Function Tests: No results for input(s): TSH, T4TOTAL, FREET4, T3FREE, THYROIDAB in the last 72 hours. Anemia Panel: No results for input(s): VITAMINB12, FOLATE, FERRITIN, TIBC, IRON, RETICCTPCT in the last 72 hours. Sepsis Labs: No results for input(s): PROCALCITON, LATICACIDVEN in the last 168 hours.  Recent Results (from the past 240 hour(s))  Surgical pcr screen     Status: None   Collection Time: 09/17/16 10:32 PM  Result Value Ref Range Status   MRSA, PCR NEGATIVE NEGATIVE Final   Staphylococcus aureus NEGATIVE NEGATIVE Final    Comment:        The Xpert SA Assay (FDA approved for NASAL specimens in patients over 34 years of age), is one component of a comprehensive surveillance program.  Test performance has been validated by [facility] for patients greater than or equal to 47 year old. It is not intended to diagnose infection nor to guide or monitor treatment.          Radiology Studies: Mr 3d Recon At Scanner  Result Date: 09/17/2016 CLINICAL DATA:  Right upper quadrant abdominal pain for 3 years. Progressively worsening over the past 4 days. EXAM: MRI ABDOMEN WITHOUT AND WITH CONTRAST (INCLUDING MRCP) TECHNIQUE: Multiplanar multisequence MR imaging of the abdomen was performed both before and after the administration of intravenous contrast. Heavily T2-weighted images of the biliary and pancreatic ducts were obtained, and three-dimensional MRCP images were rendered by post processing. CONTRAST:  19 cc MULTIHANCE GADOBENATE  DIMEGLUMINE 529 MG/ML IV SOLN COMPARISON:  Abdominal ultrasound 09/16/2016 and abdominal MRI from 11/06/2015 FINDINGS: Despite efforts by the technologist and patient, motion artifact is present on today's exam and could not be eliminated. This reduces exam sensitivity and specificity. Lower chest: Unremarkable Hepatobiliary: Multiple gallstones in the gallbladder measuring up to 2.8 cm in length. Mild periportal edema in the liver. 7 mm cyst peripherally in the right hepatic lobe on image 18/9 with similar 4 mm fluid signal intensity lesion. There is likely debris or sludge in the gallbladder fundus. Common bile duct measures up to 8 mm in diameter. The distal 3.5 cm of the CBD appears to be filled with debris or small stones, as on image 22/13. Faint accentuated mucosal thickening in this portion of the CBD 8 as on image 22/13. Mild hepatic steatosis. Pancreas:  Unremarkable Spleen:  Unremarkable Adrenals/Urinary Tract: 4.5 by 2.6 cm right mid kidney Bosniak category 2 cyst likely has a faint single internal septation. Adrenal glands normal. Stomach/Bowel: Unremarkable Vascular/Lymphatic:  Unremarkable Other:  No supplemental non-categorized findings. Musculoskeletal: Unremarkable IMPRESSION: 1. Debris or small gallstones fill the distal 3.5 cm of the CBD as shown on image 22/13. 2. Gallstones and debris are also present in the gallbladder. 3. Mild nonspecific periportal edema in the liver 4. Several small right hepatic lobe cysts. 5. Mild hepatic steatosis. 6. Benign right mid kidney cyst. Electronically Signed   By: Gaylyn Rong M.D.   On: 09/17/2016 14:28   Dg Ercp Biliary & Pancreatic Ducts  Result Date: 09/18/2016 CLINICAL DATA:  46 year old male with a history of choledocholithiasis EXAM: ERCP TECHNIQUE: Multiple spot images obtained with the fluoroscopic device and submitted for interpretation post-procedure. FLUOROSCOPY TIME:  Fluoroscopy Time:  170 seconds COMPARISON:  MRI 09/17/2016 FINDINGS:  Multiple intraoperative fluoroscopic spot images of ERCP. Initial image demonstrates endoscope projecting over the upper abdomen with cannulation of the ampulla and retrograde contrast infusion. Contrast partially opacifies the extrahepatic biliary system including common hepatic duct, common bile duct, cystic duct, and the gallbladder. Filling defects within the gallbladder lumen. Evaluation for filling defects within the extrahepatic ductal system is limited. IMPRESSION: Limited images during ERCP demonstrates cholelithiasis, with decreased sensitivity of the examination for the detection of choledocholithiasis. Please refer to the dictated operative report for full details of intraoperative findings and procedure. Electronically Signed   By: Gilmer Mor D.O.   On: 09/18/2016 11:15   Mr Abdomen Mrcp Vivien Rossetti Contast  Result Date: 09/17/2016 CLINICAL DATA:  Right upper quadrant abdominal pain for 3 years. Progressively worsening over the past 4 days. EXAM: MRI ABDOMEN WITHOUT AND WITH CONTRAST (INCLUDING MRCP) TECHNIQUE: Multiplanar multisequence MR imaging of the abdomen was performed both before and after the administration of intravenous contrast. Heavily T2-weighted images of the biliary and pancreatic ducts were obtained, and three-dimensional MRCP  images were rendered by post processing. CONTRAST:  19 cc MULTIHANCE GADOBENATE DIMEGLUMINE 529 MG/ML IV SOLN COMPARISON:  Abdominal ultrasound 09/16/2016 and abdominal MRI from 11/06/2015 FINDINGS: Despite efforts by the technologist and patient, motion artifact is present on today's exam and could not be eliminated. This reduces exam sensitivity and specificity. Lower chest: Unremarkable Hepatobiliary: Multiple gallstones in the gallbladder measuring up to 2.8 cm in length. Mild periportal edema in the liver. 7 mm cyst peripherally in the right hepatic lobe on image 18/9 with similar 4 mm fluid signal intensity lesion. There is likely debris or sludge in the  gallbladder fundus. Common bile duct measures up to 8 mm in diameter. The distal 3.5 cm of the CBD appears to be filled with debris or small stones, as on image 22/13. Faint accentuated mucosal thickening in this portion of the CBD 8 as on image 22/13. Mild hepatic steatosis. Pancreas:  Unremarkable Spleen:  Unremarkable Adrenals/Urinary Tract: 4.5 by 2.6 cm right mid kidney Bosniak category 2 cyst likely has a faint single internal septation. Adrenal glands normal. Stomach/Bowel: Unremarkable Vascular/Lymphatic:  Unremarkable Other:  No supplemental non-categorized findings. Musculoskeletal: Unremarkable IMPRESSION: 1. Debris or small gallstones fill the distal 3.5 cm of the CBD as shown on image 22/13. 2. Gallstones and debris are also present in the gallbladder. 3. Mild nonspecific periportal edema in the liver 4. Several small right hepatic lobe cysts. 5. Mild hepatic steatosis. 6. Benign right mid kidney cyst. Electronically Signed   By: Gaylyn Rong M.D.   On: 09/17/2016 14:28        Scheduled Meds: . allopurinol  100 mg Oral BID  . aspirin EC  81 mg Oral Daily  . lisinopril  10 mg Oral Daily   And  . hydrochlorothiazide  12.5 mg Oral Daily  . indomethacin  100 mg Rectal Once  . insulin aspart  0-5 Units Subcutaneous QHS  . insulin aspart  0-9 Units Subcutaneous TID WC  . senna-docusate  1 tablet Oral BID  . simethicone  160 mg Oral QID   Continuous Infusions: . sodium chloride 100 mL/hr at 09/19/16 0831  . lactated ringers 200 mL/hr at 09/19/16 0958     LOS: 2 days    Time spent: 35 minutes    Yuleni Burich, MD [facility] Pager [phone]  If 7PM-7AM, please contact night-coverage [email or web address] Password [email or web address] 09/19/2016, 1:46 PM

## 2016-09-19 NOTE — Progress Notes (Signed)
Progress Note for [facility]  Subjective: Bloated sensation and abdominal pain s/p lap chole.  Objective: Vital signs in last 24 hours: Temp:  [97 F (36.1 C)-98.3 F (36.8 C)] 97.6 F (36.4 C) (06/16 0436) Pulse Rate:  [64-99] 94 (06/16 0537) Resp:  [11-18] 18 (06/16 0436) BP: (133-166)/(78-102) 141/87 (06/16 0537) SpO2:  [93 %-100 %] 98 % (06/16 0436) Last BM Date: 09/17/16  Intake/Output from previous day: 06/15 0701 - 06/16 0700 In: 2198.3 [P.O.:180; I.V.:2018.3] Out: 1900 [Urine:1900] Intake/Output this shift: No intake/output data recorded.  General appearance: alert and no distress Resp: clear to auscultation bilaterally Cardio: regular rate and rhythm and tachycardic GI: tender at the incision sites, +BS Extremities: extremities normal, atraumatic, no cyanosis or edema  Lab Results:  Recent Labs  09/17/16 0332 09/18/16 0541 09/19/16 0505  WBC 6.5 5.2 11.4*  HGB 13.0 13.1 14.6  HCT 39.0 39.9 43.8  PLT 244 236 260   BMET  Recent Labs  09/17/16 0332 09/18/16 0541 09/19/16 0505  NA 137 139 138  K 3.3* 4.0 3.8  CL 104 106 102  CO2 24 26 26   GLUCOSE 159* 91 83  BUN 7 7 10   CREATININE 1.23 1.26* 1.22  CALCIUM 9.0 9.5 9.1   LFT  Recent Labs  09/19/16 0505  PROT 6.5  ALBUMIN 3.5  AST 96*  ALT 246*  ALKPHOS 187*  BILITOT 1.5*   PT/INR  Recent Labs  09/17/16 0332 09/18/16 0541  LABPROT 14.8 13.8  INR 1.15 1.06   Hepatitis Panel  Recent Labs  09/17/16 0332  HEPBSAG Negative  HCVAB <0.1  HEPAIGM Negative  HEPBIGM Negative   C-Diff No results for input(s): CDIFFTOX in the last 72 hours. Fecal Lactopherrin No results for input(s): FECLLACTOFRN in the last 72 hours.  Studies/Results: Mr 3d Recon At Scanner  Result Date: 09/17/2016 CLINICAL DATA:  Right upper quadrant abdominal pain for 3 years. Progressively worsening over the past 4 days. EXAM: MRI ABDOMEN WITHOUT AND WITH CONTRAST (INCLUDING MRCP) TECHNIQUE: Multiplanar  multisequence MR imaging of the abdomen was performed both before and after the administration of intravenous contrast. Heavily T2-weighted images of the biliary and pancreatic ducts were obtained, and three-dimensional MRCP images were rendered by post processing. CONTRAST:  19 cc MULTIHANCE GADOBENATE DIMEGLUMINE 529 MG/ML IV SOLN COMPARISON:  Abdominal ultrasound 09/16/2016 and abdominal MRI from 11/06/2015 FINDINGS: Despite efforts by the technologist and patient, motion artifact is present on today's exam and could not be eliminated. This reduces exam sensitivity and specificity. Lower chest: Unremarkable Hepatobiliary: Multiple gallstones in the gallbladder measuring up to 2.8 cm in length. Mild periportal edema in the liver. 7 mm cyst peripherally in the right hepatic lobe on image 18/9 with similar 4 mm fluid signal intensity lesion. There is likely debris or sludge in the gallbladder fundus. Common bile duct measures up to 8 mm in diameter. The distal 3.5 cm of the CBD appears to be filled with debris or small stones, as on image 22/13. Faint accentuated mucosal thickening in this portion of the CBD 8 as on image 22/13. Mild hepatic steatosis. Pancreas:  Unremarkable Spleen:  Unremarkable Adrenals/Urinary Tract: 4.5 by 2.6 cm right mid kidney Bosniak category 2 cyst likely has a faint single internal septation. Adrenal glands normal. Stomach/Bowel: Unremarkable Vascular/Lymphatic:  Unremarkable Other:  No supplemental non-categorized findings. Musculoskeletal: Unremarkable IMPRESSION: 1. Debris or small gallstones fill the distal 3.5 cm of the CBD as shown on image 22/13. 2. Gallstones and debris are also present in  the gallbladder. 3. Mild nonspecific periportal edema in the liver 4. Several small right hepatic lobe cysts. 5. Mild hepatic steatosis. 6. Benign right mid kidney cyst. Electronically Signed   By: Gaylyn Rong M.D.   On: 09/17/2016 14:28   Dg Ercp Biliary & Pancreatic Ducts  Result  Date: 09/18/2016 CLINICAL DATA:  46 year old male with a history of choledocholithiasis EXAM: ERCP TECHNIQUE: Multiple spot images obtained with the fluoroscopic device and submitted for interpretation post-procedure. FLUOROSCOPY TIME:  Fluoroscopy Time:  170 seconds COMPARISON:  MRI 09/17/2016 FINDINGS: Multiple intraoperative fluoroscopic spot images of ERCP. Initial image demonstrates endoscope projecting over the upper abdomen with cannulation of the ampulla and retrograde contrast infusion. Contrast partially opacifies the extrahepatic biliary system including common hepatic duct, common bile duct, cystic duct, and the gallbladder. Filling defects within the gallbladder lumen. Evaluation for filling defects within the extrahepatic ductal system is limited. IMPRESSION: Limited images during ERCP demonstrates cholelithiasis, with decreased sensitivity of the examination for the detection of choledocholithiasis. Please refer to the dictated operative report for full details of intraoperative findings and procedure. Electronically Signed   By: Gilmer Mor D.O.   On: 09/18/2016 11:15   Mr Abdomen Mrcp Vivien Rossetti Contast  Result Date: 09/17/2016 CLINICAL DATA:  Right upper quadrant abdominal pain for 3 years. Progressively worsening over the past 4 days. EXAM: MRI ABDOMEN WITHOUT AND WITH CONTRAST (INCLUDING MRCP) TECHNIQUE: Multiplanar multisequence MR imaging of the abdomen was performed both before and after the administration of intravenous contrast. Heavily T2-weighted images of the biliary and pancreatic ducts were obtained, and three-dimensional MRCP images were rendered by post processing. CONTRAST:  19 cc MULTIHANCE GADOBENATE DIMEGLUMINE 529 MG/ML IV SOLN COMPARISON:  Abdominal ultrasound 09/16/2016 and abdominal MRI from 11/06/2015 FINDINGS: Despite efforts by the technologist and patient, motion artifact is present on today's exam and could not be eliminated. This reduces exam sensitivity and specificity.  Lower chest: Unremarkable Hepatobiliary: Multiple gallstones in the gallbladder measuring up to 2.8 cm in length. Mild periportal edema in the liver. 7 mm cyst peripherally in the right hepatic lobe on image 18/9 with similar 4 mm fluid signal intensity lesion. There is likely debris or sludge in the gallbladder fundus. Common bile duct measures up to 8 mm in diameter. The distal 3.5 cm of the CBD appears to be filled with debris or small stones, as on image 22/13. Faint accentuated mucosal thickening in this portion of the CBD 8 as on image 22/13. Mild hepatic steatosis. Pancreas:  Unremarkable Spleen:  Unremarkable Adrenals/Urinary Tract: 4.5 by 2.6 cm right mid kidney Bosniak category 2 cyst likely has a faint single internal septation. Adrenal glands normal. Stomach/Bowel: Unremarkable Vascular/Lymphatic:  Unremarkable Other:  No supplemental non-categorized findings. Musculoskeletal: Unremarkable IMPRESSION: 1. Debris or small gallstones fill the distal 3.5 cm of the CBD as shown on image 22/13. 2. Gallstones and debris are also present in the gallbladder. 3. Mild nonspecific periportal edema in the liver 4. Several small right hepatic lobe cysts. 5. Mild hepatic steatosis. 6. Benign right mid kidney cyst. Electronically Signed   By: Gaylyn Rong M.D.   On: 09/17/2016 14:28    Medications:  Scheduled: . allopurinol  100 mg Oral BID  . aspirin EC  81 mg Oral Daily  . lisinopril  10 mg Oral Daily   And  . hydrochlorothiazide  12.5 mg Oral Daily  . indomethacin  100 mg Rectal Once  . insulin aspart  0-5 Units Subcutaneous QHS  . insulin aspart  0-9 Units Subcutaneous TID WC   Continuous: . sodium chloride 100 mL/hr at 09/19/16 0831  . cefTRIAXone (ROCEPHIN)  IV    . lactated ringers      Assessment/Plan: 1) Cholelithiasis. 2) Choledocholithiasis. 3) ? Post ERCP pancreatitis.   Clinically he is stable.  His lipase is elevated, but clinically I cannot discern if he has post ERCP  pancreatitis.  He is tachycardic, but he wants to minimize his pain medications as it makes him feel ill.  Plan: 1) Increase LR to 200 ml/hour in case there is an issue with post-ERCP pancreatitis. 2) Revert back to NPO. 3) Pain medications PRN.   LOS: 2 days   Cannan Beeck D 09/19/2016, 8:39 AM

## 2016-09-20 DIAGNOSIS — K859 Acute pancreatitis without necrosis or infection, unspecified: Secondary | ICD-10-CM

## 2016-09-20 HISTORY — DX: Acute pancreatitis without necrosis or infection, unspecified: K85.90

## 2016-09-20 LAB — CBC WITH DIFFERENTIAL/PLATELET
Basophils Absolute: 0 10*3/uL (ref 0.0–0.1)
Basophils Relative: 0 %
EOS ABS: 0.1 10*3/uL (ref 0.0–0.7)
Eosinophils Relative: 1 %
HCT: 41.9 % (ref 39.0–52.0)
HEMOGLOBIN: 14 g/dL (ref 13.0–17.0)
LYMPHS ABS: 1.4 10*3/uL (ref 0.7–4.0)
Lymphocytes Relative: 11 %
MCH: 30.1 pg (ref 26.0–34.0)
MCHC: 33.4 g/dL (ref 30.0–36.0)
MCV: 90.1 fL (ref 78.0–100.0)
MONO ABS: 1.3 10*3/uL — AB (ref 0.1–1.0)
MONOS PCT: 11 %
NEUTROS PCT: 77 %
Neutro Abs: 9.5 10*3/uL — ABNORMAL HIGH (ref 1.7–7.7)
Platelets: 232 10*3/uL (ref 150–400)
RBC: 4.65 MIL/uL (ref 4.22–5.81)
RDW: 13.3 % (ref 11.5–15.5)
WBC: 12.2 10*3/uL — ABNORMAL HIGH (ref 4.0–10.5)

## 2016-09-20 LAB — COMPREHENSIVE METABOLIC PANEL
ALBUMIN: 3.1 g/dL — AB (ref 3.5–5.0)
ALK PHOS: 150 U/L — AB (ref 38–126)
ALT: 167 U/L — AB (ref 17–63)
AST: 52 U/L — AB (ref 15–41)
Anion gap: 10 (ref 5–15)
BUN: 7 mg/dL (ref 6–20)
CALCIUM: 9.1 mg/dL (ref 8.9–10.3)
CHLORIDE: 102 mmol/L (ref 101–111)
CO2: 26 mmol/L (ref 22–32)
CREATININE: 1.07 mg/dL (ref 0.61–1.24)
GFR calc Af Amer: >60 mL/min (ref >60)
GFR calc non Af Amer: >60 mL/min (ref >60)
GLUCOSE: 85 mg/dL (ref 65–99)
Potassium: 3.6 mmol/L (ref 3.5–5.1)
SODIUM: 138 mmol/L (ref 135–145)
Total Bilirubin: 1.4 mg/dL — ABNORMAL HIGH (ref 0.3–1.2)
Total Protein: 6.5 g/dL (ref 6.5–8.1)

## 2016-09-20 LAB — HEMOGLOBIN A1C
HEMOGLOBIN A1C: 5.2 % (ref 4.8–5.6)
Mean Plasma Glucose: 103 mg/dL

## 2016-09-20 LAB — LIPASE, BLOOD: LIPASE: 82 U/L — AB (ref 11–51)

## 2016-09-20 LAB — GLUCOSE, CAPILLARY
Glucose-Capillary: 93 mg/dL (ref 65–99)
Glucose-Capillary: 115 mg/dL — ABNORMAL HIGH (ref 65–99)

## 2016-09-20 MED ORDER — OXYCODONE HCL 5 MG PO TABS: 5.0000 mg | ORAL_TABLET | ORAL | Status: DC | PRN

## 2016-09-20 MED ORDER — LISINOPRIL-HYDROCHLOROTHIAZIDE 10-12.5 MG PO TABS: 1.0000 | ORAL_TABLET | Freq: Every day | ORAL | 3 refills | Status: DC

## 2016-09-20 MED ORDER — TRAMADOL HCL 50 MG PO TABS: 50.0000 mg | ORAL_TABLET | Freq: Four times a day (QID) | ORAL | Status: DC | PRN

## 2016-09-20 MED ORDER — SENNOSIDES-DOCUSATE SODIUM 8.6-50 MG PO TABS: 1.0000 | ORAL_TABLET | Freq: Two times a day (BID) | ORAL | Status: DC

## 2016-09-20 MED ORDER — IBUPROFEN 400 MG PO TABS: 400.0000 mg | ORAL_TABLET | Freq: Four times a day (QID) | ORAL | 0 refills | Status: DC | PRN

## 2016-09-20 MED ORDER — ACETAMINOPHEN 325 MG PO TABS: 650.0000 mg | ORAL_TABLET | ORAL | Status: DC | PRN

## 2016-09-20 MED ORDER — IBUPROFEN 400 MG PO TABS: 400.0000 mg | ORAL_TABLET | Freq: Four times a day (QID) | ORAL | Status: DC | PRN

## 2016-09-20 NOTE — Discharge Summary (Signed)
Physician Discharge Summary  Brandon Hogan ZOX:[id number] DOB: [birth year] DOA: 09/16/2016  PCP: Pearson Grippe, MD  Admit date: 09/16/2016 Discharge date: 09/20/2016  Time spent: 65 minutes  Recommendations for Outpatient Follow-up:  #1 Follow-up with Dr. Donell Beers, general surgery in 2 weeks #2 Follow-up with Pearson Grippe, MD in 2 weeks. On follow-up patient will need a comprehensive metabolic profile done to follow-up on electrolytes renal function and LFTs. Patient also need a lipase done to follow-up on lipase levels.    Di.scharge Diagnoses:  Principal Problem:   Choledocholithiasis Active Problems:   Essential hypertension   Controlled type 2 diabetes mellitus without complication, without long-term current use of insulin (HCC)   Elevated LFTs   Symptomatic cholelithiasis   Hyperbilirubinemia   Right upper quadrant abdominal pain   RUQ abdominal pain   Cholelithiases   Pancreatitis: Post ERCP mild pancreatitis   Discharge Condition: Stable and improved  Diet recommendation: Heart healthy  Filed Weights   09/17/16 0005  Weight: 98.3 kg (216 lb 12.8 oz)    History of present illness:  Per Dr. Jeneen Rinks is a 46 y.o. male with a past medical history significant for NIDDM and HTN who presented with abdominal pain.  The patient was in his usual state of health until about the last three years when he has had episodes of RUQ pain and nausea lasting about 24 hours about every six months or so.  Last fall, he actually had a RUQ [place] that showed gall stones and and MRCP that showed nothing of concern and so his PCP started him on Actigall, which he has been taking since then.  Now, in the last three days, he had onset of pain, this time without resolution.  The pain was severe, RUQ abdomen, sharp, waxing and waning, associated with nausea and vomiting, worsened with food.  Because it lasted longer than usual this time, and was not relieved with acetaminophen or  Naproxen, he came to the ER.  ED course: -Afebrile, heart rate 101, respirations and pulse ox normal, BP 146/111 -Na 137, K 3.7, Cr 1.28 (baseline unknown), WBC 8.8K, Hgb 15.4 -AST/ALT 231/460 respectively, TBili 4.3 -Lipase normal -RUQ US showed 0.6 mm CBD, many stones in gallbladder -Case was discussed with General Surgery who recommended admission to medicine, consult to GI -He was given morphine and TRH were asked to evaluate  Hospital Course:  #1 choledocholithiasis with partial obstruction /symptomatic cholelithiasis/post ERCP pancreatitis Patient presented with symptomatic cholelithiasis with right upper quadrant pain associated with food intake and noted to have a transaminitis. Right upper quadrant ultrasound consistent with cholelithiasis. LFTs trending down. MRCP with debris or small gallstones filling the distal 3.5 cm of the, bile duct. Gallstones and debris also present in the gallbladder. Mild hepatic steatosis. Patient s/p ERCP and subsequently laparoscopic cholecystectomy 09/18/2016. ERCP done 09/18/2016 with choledocholithiasis with partial obstruction. Complete removal was accomplished by biliary sphincterotomy and balloon extraction per Dr. Leone Payor. Patient status post laparoscopic cholecystectomy per Dr. Donell Beers with no complications. Patient noted to have elevated lipase which subsequently trended down and it was felt patient likely had a mild post ERCP pancreatitis. IV fluids were increased to LR 200 mL per hour per gastroenterology. Patient placed on a full liquid diet which she tolerated. Patient's pain medication was adjusted. Patient improved clinically and will follow-up with general surgery and PCP in the outpatient setting.   #2 transaminitis Secondary to problem #1. LFTs Trended down post ERCP and laparoscopic cholecystectomy. Outpatient follow-up.  #3  hypertension Stable. Patient was maintained on home regimen of lisinopril HCTZ.  #4 well controlled diabetes  mellitus type 2 Hemoglobin A1c 5.2. Patient's oral hypoglycemic agents were held. Patient was maintained on sliding scale insulin.   Procedures:  MRCP 09/17/2016  Right upper quadrant ultrasound 09/16/2016  MRCP 09/17/2016  ERCP 09/18/2016  Laparoscopic cholecystectomy 09/18/2016   Consultations:  Gen. surgery: Dr. Lindie Spruce 09/17/2016  Gastroenterology: Dr. Leone Payor 09/17/2016  Discharge Exam: Vitals:   09/20/16 0512 09/20/16 1425  BP: 133/84 132/89  Pulse: (!) 110 (!) 112  Resp: 18 18  Temp: 99.4 F (37.4 C) 99.1 F (37.3 C)    General: NAD Cardiovascular: RRR Respiratory: CTAB  Discharge Instructions   Discharge Instructions    Diet - low sodium heart healthy    Complete by:  As directed    Increase activity slowly    Complete by:  As directed      Current Discharge Medication List    START taking these medications   Details  ibuprofen (ADVIL,MOTRIN) 400 MG tablet Take 1 tablet (400 mg total) by mouth every 6 (six) hours as needed for fever, headache or mild pain. Qty: 30 tablet, Refills: 0    oxyCODONE (OXY IR/ROXICODONE) 5 MG immediate release tablet Take 1-2 tablets (5-10 mg total) by mouth every 4 (four) hours as needed for moderate pain. Qty: 30 tablet, Refills: 0    senna-docusate (SENOKOT-S) 8.6-50 MG tablet Take 1 tablet by mouth 2 (two) times daily.      CONTINUE these medications which have CHANGED   Details  lisinopril-hydrochlorothiazide (PRINZIDE,ZESTORETIC) 10-12.5 MG tablet Take 1 tablet by mouth daily. Refills: 3      CONTINUE these medications which have NOT CHANGED   Details  allopurinol (ZYLOPRIM) 100 MG tablet Take 100 mg by mouth 2 (two) times daily.    aspirin EC 81 MG tablet Take 81 mg by mouth daily.    Icosapent Ethyl 1 g CAPS Take 1 capsule by mouth daily.    metFORMIN (GLUCOPHAGE-XR) 500 MG 24 hr tablet Take 500 mg by mouth every morning. Refills: 4       No Known Allergies Follow-up Information    [facility], [place]. Call.   Specialty:  General Surgery Why:  Call as soon as possible to make follow up appt.   Contact information: [address] [place] [address] [phone]       Pearson Grippe, MD. Schedule an appointment as soon as possible for a visit in 2 week(s).   Specialty:  Internal Medicine Contact information: [address] [address] [place] [address] [phone]            The results of significant diagnostics from this hospitalization (including imaging, microbiology, ancillary and laboratory) are listed below for reference.    Significant Diagnostic Studies: Mr 3d Recon At Scanner  Result Date: 09/17/2016 CLINICAL DATA:  Right upper quadrant abdominal pain for 3 years. Progressively worsening over the past 4 days. EXAM: MRI ABDOMEN WITHOUT AND WITH CONTRAST (INCLUDING MRCP) TECHNIQUE: Multiplanar multisequence MR imaging of the abdomen was performed both before and after the administration of intravenous contrast. Heavily T2-weighted images of the biliary and pancreatic ducts were obtained, and three-dimensional MRCP images were rendered by post processing. CONTRAST:  19 cc MULTIHANCE GADOBENATE DIMEGLUMINE 529 MG/ML IV SOLN COMPARISON:  Abdominal ultrasound 09/16/2016 and abdominal MRI from 11/06/2015 FINDINGS: Despite efforts by the technologist and patient, motion artifact is present on today's exam and could not  be eliminated. This reduces exam sensitivity and specificity. Lower chest: Unremarkable Hepatobiliary: Multiple gallstones in the gallbladder measuring up to 2.8 cm in length. Mild periportal edema in the liver. 7 mm cyst peripherally in the right hepatic lobe on image 18/9 with similar 4 mm fluid signal intensity lesion. There is likely debris or sludge in the gallbladder fundus. Common bile duct measures up to 8 mm in diameter. The distal 3.5 cm of the CBD appears to be filled with debris or small stones, as on  image 22/13. Faint accentuated mucosal thickening in this portion of the CBD 8 as on image 22/13. Mild hepatic steatosis. Pancreas:  Unremarkable Spleen:  Unremarkable Adrenals/Urinary Tract: 4.5 by 2.6 cm right mid kidney Bosniak category 2 cyst likely has a faint single internal septation. Adrenal glands normal. Stomach/Bowel: Unremarkable Vascular/Lymphatic:  Unremarkable Other:  No supplemental non-categorized findings. Musculoskeletal: Unremarkable IMPRESSION: 1. Debris or small gallstones fill the distal 3.5 cm of the CBD as shown on image 22/13. 2. Gallstones and debris are also present in the gallbladder. 3. Mild nonspecific periportal edema in the liver 4. Several small right hepatic lobe cysts. 5. Mild hepatic steatosis. 6. Benign right mid kidney cyst. Electronically Signed   By: Gaylyn Rong M.D.   On: 09/17/2016 14:28   Dg Ercp Biliary & Pancreatic Ducts  Result Date: 09/18/2016 CLINICAL DATA:  46 year old male with a history of choledocholithiasis EXAM: ERCP TECHNIQUE: Multiple spot images obtained with the fluoroscopic device and submitted for interpretation post-procedure. FLUOROSCOPY TIME:  Fluoroscopy Time:  170 seconds COMPARISON:  MRI 09/17/2016 FINDINGS: Multiple intraoperative fluoroscopic spot images of ERCP. Initial image demonstrates endoscope projecting over the upper abdomen with cannulation of the ampulla and retrograde contrast infusion. Contrast partially opacifies the extrahepatic biliary system including common hepatic duct, common bile duct, cystic duct, and the gallbladder. Filling defects within the gallbladder lumen. Evaluation for filling defects within the extrahepatic ductal system is limited. IMPRESSION: Limited images during ERCP demonstrates cholelithiasis, with decreased sensitivity of the examination for the detection of choledocholithiasis. Please refer to the dictated operative report for full details of intraoperative findings and procedure. Electronically  Signed   By: Gilmer Mor D.O.   On: 09/18/2016 11:15   Mr Abdomen Mrcp Vivien Rossetti Contast  Result Date: 09/17/2016 CLINICAL DATA:  Right upper quadrant abdominal pain for 3 years. Progressively worsening over the past 4 days. EXAM: MRI ABDOMEN WITHOUT AND WITH CONTRAST (INCLUDING MRCP) TECHNIQUE: Multiplanar multisequence MR imaging of the abdomen was performed both before and after the administration of intravenous contrast. Heavily T2-weighted images of the biliary and pancreatic ducts were obtained, and three-dimensional MRCP images were rendered by post processing. CONTRAST:  19 cc MULTIHANCE GADOBENATE DIMEGLUMINE 529 MG/ML IV SOLN COMPARISON:  Abdominal ultrasound 09/16/2016 and abdominal MRI from 11/06/2015 FINDINGS: Despite efforts by the technologist and patient, motion artifact is present on today's exam and could not be eliminated. This reduces exam sensitivity and specificity. Lower chest: Unremarkable Hepatobiliary: Multiple gallstones in the gallbladder measuring up to 2.8 cm in length. Mild periportal edema in the liver. 7 mm cyst peripherally in the right hepatic lobe on image 18/9 with similar 4 mm fluid signal intensity lesion. There is likely debris or sludge in the gallbladder fundus. Common bile duct measures up to 8 mm in diameter. The distal 3.5 cm of the CBD appears to be filled with debris or small stones, as on image 22/13. Faint accentuated mucosal thickening in this portion of the CBD 8 as on image  22/13. Mild hepatic steatosis. Pancreas:  Unremarkable Spleen:  Unremarkable Adrenals/Urinary Tract: 4.5 by 2.6 cm right mid kidney Bosniak category 2 cyst likely has a faint single internal septation. Adrenal glands normal. Stomach/Bowel: Unremarkable Vascular/Lymphatic:  Unremarkable Other:  No supplemental non-categorized findings. Musculoskeletal: Unremarkable IMPRESSION: 1. Debris or small gallstones fill the distal 3.5 cm of the CBD as shown on image 22/13. 2. Gallstones and debris are  also present in the gallbladder. 3. Mild nonspecific periportal edema in the liver 4. Several small right hepatic lobe cysts. 5. Mild hepatic steatosis. 6. Benign right mid kidney cyst. Electronically Signed   By: Gaylyn Rong M.D.   On: 09/17/2016 14:28   US Abdomen Limited Ruq  Result Date: 09/16/2016 CLINICAL DATA:  Right upper quadrant abdominal pain. Diabetes. History of gallstones. Nausea and vomiting. EXAM: ULTRASOUND ABDOMEN LIMITED RIGHT UPPER QUADRANT COMPARISON:  06/19/2016 FINDINGS: Gallbladder: Shadowing gallstones measuring up to 3.1 cm in diameter. Sludge observed in the gallbladder. Gallbladder wall thickness up to 4 mm. Sonographic Murphy's sign absent. Common bile duct: Diameter: 6 mm Liver: No focal lesion identified. Coarse echogenic liver with poor sonic penetration compatible with diffuse hepatic steatosis. IMPRESSION: 1. Cholelithiasis with gallbladder wall thickening. No pericholecystic fluid or sonographic Murphy sign. Correlate clinically in assessing for acute cholecystitis. 2. Coarse echogenic liver with poor sonic penetration compatible with diffuse hepatic steatosis. 3. Mild CBD dilatation and 6 mm, without directly visualized choledocholithiasis. Electronically Signed   By: Gaylyn Rong M.D.   On: 09/16/2016 21:22    Microbiology: Recent Results (from the past 240 hour(s))  Surgical pcr screen     Status: None   Collection Time: 09/17/16 10:32 PM  Result Value Ref Range Status   MRSA, PCR NEGATIVE NEGATIVE Final   Staphylococcus aureus NEGATIVE NEGATIVE Final    Comment:        The Xpert SA Assay (FDA approved for NASAL specimens in patients over 74 years of age), is one component of a comprehensive surveillance program.  Test performance has been validated by [facility] for patients greater than or equal to 63 year old. It is not intended to diagnose infection nor to guide or monitor treatment.      Labs: Basic Metabolic Panel:  Recent  Labs Lab 09/16/16 1854 09/17/16 0332 09/18/16 0541 09/19/16 0505 09/20/16 0319  NA 137 137 139 138 138  K 3.7 3.3* 4.0 3.8 3.6  CL 101 104 106 102 102  CO2 25 24 26 26 26   GLUCOSE 109* 159* 91 83 85  BUN 7 7 7 10 7   CREATININE 1.28* 1.23 1.26* 1.22 1.07  CALCIUM 9.8 9.0 9.5 9.1 9.1  MG  --  1.9 1.9  --   --    Liver Function Tests:  Recent Labs Lab 09/16/16 1854 09/17/16 0332 09/18/16 0541 09/19/16 0505 09/20/16 0319  AST 231* 156* 114* 96* 52*  ALT 460* 349* 290* 246* 167*  ALKPHOS 184* 161* 179* 187* 150*  BILITOT 4.3* 3.6* 3.5* 1.5* 1.4*  PROT 7.8 6.4* 6.1* 6.5 6.5  ALBUMIN 4.4 3.5 3.4* 3.5 3.1*    Recent Labs Lab 09/16/16 1854 09/19/16 0505 09/20/16 0319  LIPASE 25 315* 82*  AMYLASE  --  412*  --    No results for input(s): AMMONIA in the last 168 hours. CBC:  Recent Labs Lab 09/16/16 1854 09/17/16 0332 09/18/16 0541 09/19/16 0505 09/20/16 0319  WBC 8.8 6.5 5.2 11.4* 12.2*  NEUTROABS  --   --  2.8 8.9* 9.5*  HGB 15.4 13.0 13.1 14.6 14.0  HCT 45.5 39.0 39.9 43.8 41.9  MCV 89.0 88.8 89.7 89.9 90.1  PLT 296 244 236 260 232   Cardiac Enzymes: No results for input(s): CKTOTAL, CKMB, CKMBINDEX, TROPONINI in the last 168 hours. BNP: BNP (last 3 results) No results for input(s): BNP in the last 8760 hours.  ProBNP (last 3 results) No results for input(s): PROBNP in the last 8760 hours.  CBG:  Recent Labs Lab 09/19/16 1544 09/19/16 1721 09/19/16 2120 09/20/16 0808 09/20/16 1217  GLUCAP 104* 81 105* 93 115*       Signed:  THOMPSON,DANIEL MD.  [facility] 09/20/2016, 4:02 PM

## 2016-09-20 NOTE — Progress Notes (Signed)
Pt ready for discharge accomp by wife to home.  DC instructions given and reviewed with pt and wife.  Rx for Oxycodone and Advil given and reviewed.  What to call MD for reviewed.  Pt and wife understand follow up appts with [facility] and Dr. Selena Batten for follow up lab work.  No further questions verbalized about home self care.

## 2016-09-20 NOTE — Progress Notes (Signed)
2 Days Post-Op   Subjective/Chief Complaint: Pt did get pancreatitis post ERCP, but mild.     Objective: Vital signs in last 24 hours: Temp:  [98 F (36.7 C)-99.7 F (37.6 C)] 99.4 F (37.4 C) (06/17 0512) Pulse Rate:  [107-110] 110 (06/17 0512) Resp:  [18] 18 (06/17 0512) BP: (133-143)/(84-92) 133/84 (06/17 0512) SpO2:  [93 %-95 %] 94 % (06/17 0512) Last BM Date: 09/17/16  Intake/Output from previous day: 06/16 0701 - 06/17 0700 In: 3883.3 [P.O.:880; I.V.:3003.3] Out: 1700 [Urine:1700] Intake/Output this shift: No intake/output data recorded.  General:  Looks MUCH better. Resp: breathing comfortably GI: soft, nontender, minimally distended. Scleral icterus resolved.    Lab Results:   Recent Labs  09/19/16 0505 09/20/16 0319  WBC 11.4* 12.2*  HGB 14.6 14.0  HCT 43.8 41.9  PLT 260 232   BMET  Recent Labs  09/19/16 0505 09/20/16 0319  NA 138 138  K 3.8 3.6  CL 102 102  CO2 26 26  GLUCOSE 83 85  BUN 10 7  CREATININE 1.22 1.07  CALCIUM 9.1 9.1   PT/INR  Recent Labs  09/18/16 0541  LABPROT 13.8  INR 1.06   ABG No results for input(s): PHART, HCO3 in the last 72 hours.  Invalid input(s): PCO2, PO2  Studies/Results: No results found.  Anti-infectives: Anti-infectives    Start     Dose/Rate Route Frequency Ordered Stop   09/18/16 0900  cefTRIAXone (ROCEPHIN) 2 g in dextrose 5 % 50 mL IVPB    Comments:  Pharmacy may adjust dosing strength / duration / interval for maximal efficacy   2 g 100 mL/hr over 30 Minutes Intravenous To Surgery 09/17/16 1706 09/19/16 0900   09/17/16 2100  Ampicillin-Sulbactam (UNASYN) 3 g in sodium chloride 0.9 % 100 mL IVPB     3 g 200 mL/hr over 30 Minutes Intravenous  Once 09/17/16 2053 09/18/16 0925   09/17/16 1700  cefTRIAXone (ROCEPHIN) 2 g in dextrose 5 % 50 mL IVPB  Status:  Discontinued    Comments:  Pharmacy may adjust dosing strength / duration / interval for maximal efficacy   2 g 100 mL/hr over 30 Minutes  Intravenous On call 09/17/16 1624 09/17/16 1743      Assessment/Plan: s/p Procedure(s): LAPAROSCOPIC CHOLECYSTECTOMY (N/A) ENDOSCOPIC RETROGRADE CHOLANGIOPANCREATOGRAPHY (ERCP) (N/A) Pancreatitis s/p ERCP - Lipase still mildly elevated, but pt clinically better. Dispo per primary team.     LOS: 3 days    [facility] 09/20/2016

## 2016-09-21 NOTE — Anesthesia Postprocedure Evaluation (Signed)
Anesthesia Post Note  Patient: Brandon Hogan  Procedure(s) Performed: Procedure(s) (LRB): LAPAROSCOPIC CHOLECYSTECTOMY (N/A) ENDOSCOPIC RETROGRADE CHOLANGIOPANCREATOGRAPHY (ERCP) (N/A)     Patient location during evaluation: PACU Anesthesia Type: General Level of consciousness: awake and alert Pain management: pain level controlled Vital Signs Assessment: post-procedure vital signs reviewed and stable Respiratory status: spontaneous breathing, nonlabored ventilation, respiratory function stable and patient connected to nasal cannula oxygen Cardiovascular status: stable Postop Assessment: no signs of nausea or vomiting Anesthetic complications: no    Last Vitals:  Vitals:   09/20/16 0512 09/20/16 1425  BP: 133/84 132/89  Pulse: (!) 110 (!) 112  Resp: 18 18  Temp: 37.4 C 37.3 C    Last Pain:  Vitals:   09/20/16 1425  TempSrc: Oral  PainSc:                  Lam Mistey Hoffert

## 2018-05-13 DIAGNOSIS — E785 Hyperlipidemia, unspecified: Secondary | ICD-10-CM | POA: Insufficient documentation

## 2018-05-13 DIAGNOSIS — I428 Other cardiomyopathies: Secondary | ICD-10-CM | POA: Insufficient documentation

## 2018-05-13 DIAGNOSIS — Z8249 Family history of ischemic heart disease and other diseases of the circulatory system: Secondary | ICD-10-CM | POA: Insufficient documentation

## 2018-05-13 NOTE — Progress Notes (Signed)
Subjective:   @Patient  ID@: Brandon Hogan, male    DOB: 09-28-1970, 48 y.o.   MRN: [id number]  Pearson Grippe, MD:  Chief Complaint  Patient presents with  . Follow-up    6 months  . Cardiomyopathy    HPI   48 year old male with hypertension, hyperlipidemia, family history of premature CAD, type II diabetes mellitus, was last seen in 08/2016. His prior workup showed mildly reduced EF of 40-45%. He had excellent exercise capacity on stress test with 12 minutes and no ischemia/infarct.  He is here on 6 month office visit. Dyspnea on exertion has improved over the last few months. He has now completely quit smoking and using vape and he attributes improvement in symptoms to this. Occasional alcohol use. Occasional chest pain that overall has improved from before. Very mild symptoms of claudication on occasion with over excertion.   He has not been monitoring his blood pressure at home. Reports diabetes is fairly well controlled. He has not been exercising regularly.  Past Medical History:  Diagnosis Date  . Cardiomyopathy (HCC)   . Diabetes mellitus without complication (HCC)   . Gallstone   . Hypertension     Past Surgical History:  Procedure Laterality Date  . CHOLECYSTECTOMY N/A 09/18/2016   Procedure: LAPAROSCOPIC CHOLECYSTECTOMY;  Surgeon: Almond Lint, MD;  Location: [facility] OR;  Service: General;  Laterality: N/A;  . ERCP N/A 09/18/2016   Procedure: ENDOSCOPIC RETROGRADE CHOLANGIOPANCREATOGRAPHY (ERCP);  Surgeon: Iva Boop, MD;  Location: [facility] OR;  Service: Endoscopy;  Laterality: N/A;    Family History  Problem Relation Age of Onset  . Heart attack Mother   . Gallbladder disease Father   . Heart attack Father   . Heart attack Brother     Social History   Socioeconomic History  . Marital status: Married    Spouse name: Not on file  . Number of children: Not on file  . Years of education: Not on file  . Highest education level: Not on file    Occupational History  . Not on file  Social Needs  . Financial resource strain: Not on file  . Food insecurity:    Worry: Not on file    Inability: Not on file  . Transportation needs:    Medical: Not on file    Non-medical: Not on file  Tobacco Use  . Smoking status: Former Smoker    Last attempt to quit: 05/17/2011    Years since quitting: 7.0  . Smokeless tobacco: Never Used  Substance and Sexual Activity  . Alcohol use: Yes  . Drug use: No  . Sexual activity: Not on file  Lifestyle  . Physical activity:    Days per week: Not on file    Minutes per session: Not on file  . Stress: Not on file  Relationships  . Social connections:    Talks on phone: Not on file    Gets together: Not on file    Attends religious service: Not on file    Active member of club or organization: Not on file    Attends meetings of clubs or organizations: Not on file    Relationship status: Not on file  . Intimate partner violence:    Fear of current or ex partner: Not on file    Emotionally abused: Not on file    Physically abused: Not on file    Forced sexual activity: Not on file  Other Topics Concern  . Not  on file  Social History Narrative  . Not on file    Current Outpatient Medications on File Prior to Visit  Medication Sig Dispense Refill  . allopurinol (ZYLOPRIM) 100 MG tablet Take 100 mg by mouth 2 (two) times daily.    Marland Kitchen ibuprofen (ADVIL,MOTRIN) 400 MG tablet Take 1 tablet (400 mg total) by mouth every 6 (six) hours as needed for fever, headache or mild pain. 30 tablet 0  . Icosapent Ethyl 1 g CAPS Take 2 capsules by mouth 2 (two) times daily.     Marland Kitchen lisinopril-hydrochlorothiazide (PRINZIDE,ZESTORETIC) 10-12.5 MG tablet Take 1 tablet by mouth daily.  3  . metFORMIN (GLUCOPHAGE-XR) 500 MG 24 hr tablet Take 500 mg by mouth every morning.  4  . metoprolol succinate (TOPROL-XL) 50 MG 24 hr tablet Take 50 mg by mouth daily.    Marland Kitchen oxyCODONE (OXY IR/ROXICODONE) 5 MG immediate release  tablet Take 1-2 tablets (5-10 mg total) by mouth every 4 (four) hours as needed for moderate pain. (Patient not taking: Reported on 05/16/2018) 30 tablet 0  . senna-docusate (SENOKOT-S) 8.6-50 MG tablet Take 1 tablet by mouth 2 (two) times daily. (Patient not taking: Reported on 05/16/2018)    . simvastatin (ZOCOR) 20 MG tablet Take 20 mg by mouth daily.     No current facility-administered medications on file prior to visit.      Review of Systems  Constitution: Negative for decreased appetite, malaise/fatigue, weight gain and weight loss.  Eyes: Negative for visual disturbance.  Cardiovascular: Positive for chest pain (occasional; improved) and dyspnea on exertion. Negative for leg swelling and syncope. Claudication: very minimal with over exertion.  Respiratory: Negative for hemoptysis and wheezing.   Endocrine: Negative for cold intolerance and heat intolerance.  Skin: Negative for nail changes.  Musculoskeletal: Negative for myalgias.  Gastrointestinal: Negative for abdominal pain, nausea and vomiting.  Neurological: Negative for difficulty with concentration, dizziness, focal weakness and headaches.  Psychiatric/Behavioral: Negative for altered mental status and suicidal ideas.  All other systems reviewed and are negative.      Objective:     Echocardiogram 08/11/2016: Left ventricle cavity is normal in size. Mild to moderate decrease in LV systolic function. Visual EF is 40-45%. Global hypokinesis.  Normal diastolic filling pattern. Calculated EF 51%. Structurally normal tricuspid valve with trace regurgitation. No evidence of pulmonary hypertension.  Exercise sestamibi stress test 08/03/2016: 1. The resting electrocardiogram demonstrated normal sinus rhythm, normal resting conduction, no resting arrhythmias and normal rest repolarization.  The stress electrocardiogram was normal.  Patient exercised on Bruce protocol for 10.09 minutes and achieved 12.04 METS. Stress test  terminated due to dyspnea and 89% MPHR achieved (Target HR >85%). Excellent effort.  2. The overall quality of the study is good.  Left ventricular cavity is noted to be normal on the rest and stress studies.  Additionally, the right ventricle is normal.  SPECT images demonstrate homogeneous tracer distribution throughout the myocardium.  The left ventricular ejection fraction was calculated to be 41%, visually appears normal. This is a low risk study.    Physical Exam  Constitutional: He is oriented to person, place, and time. Vital signs are normal. He appears well-developed and well-nourished.  HENT:  Head: Normocephalic and atraumatic.  Neck: Normal range of motion.  Cardiovascular: Regular rhythm, normal heart sounds and intact distal pulses. Tachycardia present.  Pulmonary/Chest: Effort normal and breath sounds normal. No accessory muscle usage. No respiratory distress.  Abdominal: Soft. Bowel sounds are normal.  Musculoskeletal: Normal range of  motion.  Neurological: He is alert and oriented to person, place, and time.  Skin: Skin is warm and dry.  Vitals reviewed.       Assessment & Recommendations:   1. Essential hypertension Hypertension continues to be uncontrolled despite being on metoprolol and lisinopril or thiazide.  I will change lisinopril to valsartan hydrochlorothiazide.  He is to see PCP next week for follow-up.  Reports having labs performed today and will have copy sent to [place] for our evaluation.  2. Nonischemic cardiomyopathy (HCC) No clinical evidence of decompensated heart failure.  Has dyspnea on exertion that has improved since cutting smoking.  He has LVEF visually at 40-45%.  He may benefit from [facility] therapy.  Will consider repeating echocardiogram first to see if he has had improvement on his LVEF. - EKG 12-Lead  3. Mixed hyperlipidemia Lipids were performed today by his PCP and will have copies sent to [place] for evaluation.  4. Former tobacco  use Patient has completely quit using cigarettes and vape since last seen by me. Congratulated him on his smoking cessation and provided positive reinforcement. Encouraged continued complete smoking cessation.  I will see him back in 4 weeks for follow up on hypertension.     Altamese Penuelas, FNP-C [facility] Office: [phone] Fax: [phone]

## 2018-05-16 ENCOUNTER — Ambulatory Visit: Payer: BC Managed Care – PPO | Admitting: Cardiology

## 2018-05-16 ENCOUNTER — Other Ambulatory Visit: Payer: Self-pay | Admitting: Cardiology

## 2018-05-16 ENCOUNTER — Encounter: Payer: Self-pay | Admitting: Cardiology

## 2018-05-16 VITALS — BP 144/92 | HR 87 | Ht 74.0 in | Wt 233.8 lb

## 2018-05-16 DIAGNOSIS — I428 Other cardiomyopathies: Secondary | ICD-10-CM

## 2018-05-16 DIAGNOSIS — Z87891 Personal history of nicotine dependence: Secondary | ICD-10-CM | POA: Diagnosis not present

## 2018-05-16 DIAGNOSIS — I429 Cardiomyopathy, unspecified: Secondary | ICD-10-CM | POA: Insufficient documentation

## 2018-05-16 DIAGNOSIS — E782 Mixed hyperlipidemia: Secondary | ICD-10-CM

## 2018-05-16 DIAGNOSIS — I10 Essential (primary) hypertension: Secondary | ICD-10-CM

## 2018-05-16 MED ORDER — VALSARTAN-HYDROCHLOROTHIAZIDE 160-12.5 MG PO TABS: 1.0000 | ORAL_TABLET | Freq: Every day | ORAL | 1 refills | Status: DC

## 2018-05-17 DIAGNOSIS — Z87891 Personal history of nicotine dependence: Secondary | ICD-10-CM | POA: Insufficient documentation

## 2018-06-14 NOTE — Progress Notes (Signed)
Subjective:   @Patient  ID@: Brandon Hogan, male    DOB: Jan 24, 1971, 48 y.o.   MRN: [id number]  Pearson Grippe, MD:  Chief Complaint  Patient presents with  . Hypertension  . Follow-up    4 week, last ekg 05/16/18    HPI   48 year old male with hypertension, hyperlipidemia, family history of premature CAD, type II diabetes mellitus, was last seen in 08/2016. His prior workup showed mildly reduced EF of 40-45%. He had excellent exercise capacity on stress test with 12 minutes and no ischemia/infarct.  He is here on 4 week office visit. He has now completely quit smoking and using vape and he attributes improvement in symptoms to this. Occasional alcohol use. Occasional chest pain with exertion. Continues to have dyspnea on exertion. Very mild symptoms of claudication on occasion with over exertion. He was started on Valsartan HCTZ at his last office visit and reports that his blood pressure was well controlled at his PCP visit 1 week later. He has not been monitoring at home.   Reports diabetes is fairly well controlled. He has not been exercising regularly.  Past Medical History:  Diagnosis Date  . Cardiomyopathy (HCC)   . Diabetes mellitus without complication (HCC)   . Gallstone   . Hypertension     Past Surgical History:  Procedure Laterality Date  . CHOLECYSTECTOMY N/A 09/18/2016   Procedure: LAPAROSCOPIC CHOLECYSTECTOMY;  Surgeon: Almond Lint, MD;  Location: [facility] OR;  Service: General;  Laterality: N/A;  . ERCP N/A 09/18/2016   Procedure: ENDOSCOPIC RETROGRADE CHOLANGIOPANCREATOGRAPHY (ERCP);  Surgeon: Iva Boop, MD;  Location: [facility] OR;  Service: Endoscopy;  Laterality: N/A;    Family History  Problem Relation Age of Onset  . Heart attack Mother   . Gallbladder disease Father   . Heart attack Father   . Heart attack Brother     Social History   Socioeconomic History  . Marital status: Married    Spouse name: Not on file  . Number of children: 2  .  Years of education: Not on file  . Highest education level: Not on file  Occupational History  . Not on file  Social Needs  . Financial resource strain: Not on file  . Food insecurity:    Worry: Not on file    Inability: Not on file  . Transportation needs:    Medical: Not on file    Non-medical: Not on file  Tobacco Use  . Smoking status: Former Smoker    Last attempt to quit: 05/17/2011    Years since quitting: 7.0  . Smokeless tobacco: Never Used  Substance and Sexual Activity  . Alcohol use: Yes  . Drug use: No  . Sexual activity: Not on file  Lifestyle  . Physical activity:    Days per week: Not on file    Minutes per session: Not on file  . Stress: Not on file  Relationships  . Social connections:    Talks on phone: Not on file    Gets together: Not on file    Attends religious service: Not on file    Active member of club or organization: Not on file    Attends meetings of clubs or organizations: Not on file    Relationship status: Not on file  . Intimate partner violence:    Fear of current or ex partner: Not on file    Emotionally abused: Not on file    Physically abused: Not on file  Forced sexual activity: Not on file  Other Topics Concern  . Not on file  Social History Narrative  . Not on file    Current Outpatient Medications on File Prior to Visit  Medication Sig Dispense Refill  . allopurinol (ZYLOPRIM) 100 MG tablet Take 100 mg by mouth 2 (two) times daily.    Marland Kitchen ibuprofen (ADVIL,MOTRIN) 400 MG tablet Take 1 tablet (400 mg total) by mouth every 6 (six) hours as needed for fever, headache or mild pain. 30 tablet 0  . Icosapent Ethyl 1 g CAPS Take 2 capsules by mouth 2 (two) times daily.     . metFORMIN (GLUCOPHAGE-XR) 500 MG 24 hr tablet Take 500 mg by mouth every morning.  4  . metoprolol succinate (TOPROL-XL) 50 MG 24 hr tablet Take 50 mg by mouth daily.    . simvastatin (ZOCOR) 20 MG tablet Take 20 mg by mouth daily.    .  valsartan-hydrochlorothiazide (DIOVAN-HCT) 160-12.5 MG tablet TAKE 1 TABLET BY MOUTH EVERY DAY 90 tablet 3  . oxyCODONE (OXY IR/ROXICODONE) 5 MG immediate release tablet Take 1-2 tablets (5-10 mg total) by mouth every 4 (four) hours as needed for moderate pain. (Patient not taking: Reported on 05/16/2018) 30 tablet 0  . senna-docusate (SENOKOT-S) 8.6-50 MG tablet Take 1 tablet by mouth 2 (two) times daily. (Patient not taking: Reported on 05/16/2018)     No current facility-administered medications on file prior to visit.      Review of Systems  Constitution: Negative for decreased appetite, malaise/fatigue, weight gain and weight loss.  Eyes: Negative for visual disturbance.  Cardiovascular: Positive for chest pain (occasional; improved) and dyspnea on exertion. Negative for leg swelling and syncope. Claudication: very minimal with over exertion.  Respiratory: Negative for hemoptysis and wheezing.   Endocrine: Negative for cold intolerance and heat intolerance.  Skin: Negative for nail changes.  Musculoskeletal: Negative for myalgias.  Gastrointestinal: Negative for abdominal pain, nausea and vomiting.  Neurological: Negative for difficulty with concentration, dizziness, focal weakness and headaches.  Psychiatric/Behavioral: Negative for altered mental status and suicidal ideas.  All other systems reviewed and are negative.      Objective:     EKG 05/16/2018: Normal sinus rhythm at 85 bpm, normal axis, T wave inversion in lead 3; otherwise no evidence of ischemia.   Echocardiogram 08/11/2016: Left ventricle cavity is normal in size. Mild to moderate decrease in LV systolic function. Visual EF is 40-45%. Global hypokinesis.  Normal diastolic filling pattern. Calculated EF 51%. Structurally normal tricuspid valve with trace regurgitation. No evidence of pulmonary hypertension.  Exercise sestamibi stress test 08/03/2016: 1. The resting electrocardiogram demonstrated normal sinus rhythm,  normal resting conduction, no resting arrhythmias and normal rest repolarization.  The stress electrocardiogram was normal.  Patient exercised on Bruce protocol for 10.09 minutes and achieved 12.04 METS. Stress test terminated due to dyspnea and 89% MPHR achieved (Target HR >85%). Excellent effort.  2. The overall quality of the study is good.  Left ventricular cavity is noted to be normal on the rest and stress studies.  Additionally, the right ventricle is normal.  SPECT images demonstrate homogeneous tracer distribution throughout the myocardium.  The left ventricular ejection fraction was calculated to be 41%, visually appears normal. This is a low risk study.    Physical Exam  Constitutional: He is oriented to person, place, and time. Vital signs are normal. He appears well-developed and well-nourished.  HENT:  Head: Normocephalic and atraumatic.  Neck: Normal range of motion.  Cardiovascular:  Normal rate, regular rhythm, normal heart sounds and intact distal pulses.  Trace bilateral leg edema  Pulmonary/Chest: Effort normal and breath sounds normal. No accessory muscle usage. No respiratory distress.  Abdominal: Soft. Bowel sounds are normal.  Musculoskeletal: Normal range of motion.  Neurological: He is alert and oriented to person, place, and time.  Skin: Skin is warm and dry.  Vitals reviewed.       Assessment & Recommendations:   1. Essential hypertension Continues to have uncontrolled hypertension despite starting valsartan hydrochlorothiazide.  He is noted to have trace leg edema and in view of his cardiomyopathy, he will benefit from Aldactone.  We will start him on 25 mg daily.  Will check BMP in 10 days for surveillance.  He has had recent blood work with PCP and he will bring a copy to [place] for evaluation.  2. Nonischemic cardiomyopathy (HCC) No clinical evidence of decompensated heart failure.  Continues to have dyspnea on exertion that is unchanged.  I recommended  repeating echocardiogram for surveillance of LVEF since last echocardiogram was performed in 2018.  3. Mixed hyperlipidemia Lipids were performed today by his PCP and will have copies sent to [place] for evaluation.  4. Former tobacco use Continues to remain abstinent from tobacco use as well as vape.  I congratulated him on this and stressed the importance.  Plan: I will see him back in 8 weeks for follow-up on hypertension and nonischemic cardiomyopathy.    Altamese , FNP-C [facility] Office: [phone] Fax: [phone]

## 2018-06-16 ENCOUNTER — Other Ambulatory Visit: Payer: Self-pay

## 2018-06-16 ENCOUNTER — Encounter: Payer: Self-pay | Admitting: Cardiology

## 2018-06-16 ENCOUNTER — Ambulatory Visit: Payer: BLUE CROSS/BLUE SHIELD | Admitting: Cardiology

## 2018-06-16 VITALS — BP 145/94 | HR 83 | Ht 74.0 in | Wt 234.6 lb

## 2018-06-16 DIAGNOSIS — I10 Essential (primary) hypertension: Secondary | ICD-10-CM

## 2018-06-16 DIAGNOSIS — I428 Other cardiomyopathies: Secondary | ICD-10-CM

## 2018-06-16 MED ORDER — SPIRONOLACTONE 25 MG PO TABS: 25.0000 mg | ORAL_TABLET | Freq: Every day | ORAL | 2 refills | Status: DC

## 2018-06-19 ENCOUNTER — Other Ambulatory Visit: Payer: Self-pay | Admitting: Cardiology

## 2018-06-29 ENCOUNTER — Other Ambulatory Visit: Payer: Self-pay | Admitting: Cardiology

## 2018-06-30 LAB — BASIC METABOLIC PANEL
BUN/Creatinine Ratio: 9 (ref 9–20)
BUN: 11 mg/dL (ref 6–24)
CO2: 24 mmol/L (ref 20–29)
Calcium: 10 mg/dL (ref 8.7–10.2)
Chloride: 99 mmol/L (ref 96–106)
Creatinine, Ser: 1.25 mg/dL (ref 0.76–1.27)
GFR calc Af Amer: 79 mL/min/{1.73_m2} (ref >59)
GFR calc non Af Amer: 69 mL/min/{1.73_m2} (ref >59)
GLUCOSE: 145 mg/dL — AB (ref 65–99)
POTASSIUM: 4.6 mmol/L (ref 3.5–5.2)
Sodium: 142 mmol/L (ref 134–144)

## 2018-06-30 NOTE — Progress Notes (Signed)
Lvm for patient.

## 2018-08-11 ENCOUNTER — Encounter: Payer: Self-pay | Admitting: Cardiology

## 2018-08-12 ENCOUNTER — Encounter: Payer: Self-pay | Admitting: Cardiology

## 2018-08-12 ENCOUNTER — Ambulatory Visit: Payer: BC Managed Care – PPO | Admitting: Cardiology

## 2018-08-12 ENCOUNTER — Other Ambulatory Visit: Payer: Self-pay

## 2018-08-12 VITALS — BP 137/95 | HR 87 | Ht 74.0 in | Wt 235.0 lb

## 2018-08-12 DIAGNOSIS — I428 Other cardiomyopathies: Secondary | ICD-10-CM

## 2018-08-12 DIAGNOSIS — I739 Peripheral vascular disease, unspecified: Secondary | ICD-10-CM | POA: Diagnosis not present

## 2018-08-12 DIAGNOSIS — Z87891 Personal history of nicotine dependence: Secondary | ICD-10-CM | POA: Diagnosis not present

## 2018-08-12 DIAGNOSIS — I10 Essential (primary) hypertension: Secondary | ICD-10-CM | POA: Diagnosis not present

## 2018-08-12 MED ORDER — VALSARTAN-HYDROCHLOROTHIAZIDE 320-25 MG PO TABS: 1.0000 | ORAL_TABLET | Freq: Every day | ORAL | 2 refills | Status: DC

## 2018-08-12 NOTE — Addendum Note (Signed)
Addended by: Suzy Bouchard on: 08/12/2018 09:35 AM   Modules accepted: Orders

## 2018-08-12 NOTE — Progress Notes (Signed)
Primary Physician/Referring:  Jani Gravel, MD  Patient ID: Brandon Hogan, male    DOB: 1971/03/07, 48 y.o.   MRN: [id number]  Chief Complaint  Patient presents with  . Cardiomyopathy    8 week f/u   . Hypertension   This visit type was conducted due to national recommendations for restrictions regarding the COVID-19 Pandemic (e.g. social distancing).  This format is felt to be most appropriate for this patient at this time.  All issues noted in this document were discussed and addressed.  No physical exam was performed (except for noted visual exam findings with Telehealth visits).  The patient has consented to conduct a Telehealth visit and understands insurance will be billed.   I discussed the limitations of evaluation and management by telemedicine and the availability of in person appointments. The patient expressed understanding and agreed to proceed.  Virtual Visit via Video Note is as below  I connected with Brandon Hogan, on 08/12/18 at 0910 by a video enabled telemedicine application and verified that I am speaking with the correct person using two identifiers.     I have discussed with her regarding the safety during COVID Pandemic and steps and precautions including social distancing with the patient.    HPI: Brandon Hogan  is a 48 y.o. male  with hypertension, hyperlipidemia, family history of premature CAD, type II diabetes mellitus, was last seen in 08/2016. His prior workup showed mildly reduced EF of 40-45%. He had excellent exercise capacity on stress test with 12 minutes and no ischemia/infarct.  He is here on 8 week office visit. He has now completely quit smoking and using vape and he attributes improvement in symptoms to this. Occasional alcohol use. Occasional chest pain with exertion. Continues to have dyspnea on exertion. Noticing more symptoms of claudication with exertion. He was started on Aldactone at his last office visit and is tolerating this  well.  Blood pressure has been somewhat improved, but varies from 120- upper 130's. Continues to have elevated diastolic.   By PCP labs, HgbA1c is 6.6%. Triglycerides elevated. He has been trying to walk more recently and admits he could make diet changes.  Past Medical History:  Diagnosis Date  . Cardiomyopathy ([place])   . Diabetes mellitus without complication ([place])   . Gallstone   . Hypertension     Past Surgical History:  Procedure Laterality Date  . CHOLECYSTECTOMY N/A 09/18/2016   Procedure: LAPAROSCOPIC CHOLECYSTECTOMY;  Surgeon: Stark Klein, MD;  Location: [facility];  Service: General;  Laterality: N/A;  . ERCP N/A 09/18/2016   Procedure: ENDOSCOPIC RETROGRADE CHOLANGIOPANCREATOGRAPHY (ERCP);  Surgeon: Gatha Mayer, MD;  Location: [facility];  Service: Endoscopy;  Laterality: N/A;    Social History   Socioeconomic History  . Marital status: Married    Spouse name: Not on file  . Number of children: 2  . Years of education: Not on file  . Highest education level: Not on file  Occupational History  . Not on file  Social Needs  . Financial resource strain: Not on file  . Food insecurity:    Worry: Not on file    Inability: Not on file  . Transportation needs:    Medical: Not on file    Non-medical: Not on file  Tobacco Use  . Smoking status: Former Smoker    Last attempt to quit: 05/17/2011    Years since quitting: 7.2  . Smokeless tobacco: Never Used  Substance and Sexual Activity  . Alcohol  use: Yes    Comment: rare  . Drug use: No  . Sexual activity: Not on file  Lifestyle  . Physical activity:    Days per week: Not on file    Minutes per session: Not on file  . Stress: Not on file  Relationships  . Social connections:    Talks on phone: Not on file    Gets together: Not on file    Attends religious service: Not on file    Active member of club or organization: Not on file    Attends meetings of clubs or organizations: Not on file    Relationship status:  Not on file  . Intimate partner violence:    Fear of current or ex partner: Not on file    Emotionally abused: Not on file    Physically abused: Not on file    Forced sexual activity: Not on file  Other Topics Concern  . Not on file  Social History Narrative  . Not on file    Current Outpatient Medications on File Prior to Visit  Medication Sig Dispense Refill  . allopurinol (ZYLOPRIM) 100 MG tablet Take 100 mg by mouth 2 (two) times daily.    Marland Kitchen ibuprofen (ADVIL,MOTRIN) 400 MG tablet Take 1 tablet (400 mg total) by mouth every 6 (six) hours as needed for fever, headache or mild pain. 30 tablet 0  . metFORMIN (GLUCOPHAGE-XR) 500 MG 24 hr tablet Take 500 mg by mouth every morning.  4  . metoprolol succinate (TOPROL-XL) 50 MG 24 hr tablet TAKE 1 TABLET BY MOUTH DAILY 30 tablet 6  . simvastatin (ZOCOR) 20 MG tablet Take 20 mg by mouth daily.    Marland Kitchen spironolactone (ALDACTONE) 25 MG tablet Take 1 tablet (25 mg total) by mouth daily for 30 days. 30 tablet 2  . valsartan-hydrochlorothiazide (DIOVAN-HCT) 160-12.5 MG tablet TAKE 1 TABLET BY MOUTH EVERY DAY 90 tablet 3   No current facility-administered medications on file prior to visit.     Review of Systems  Constitution: Negative for decreased appetite, malaise/fatigue, weight gain and weight loss.  Eyes: Negative for visual disturbance.  Cardiovascular: Positive for chest pain (occasional; improved) and dyspnea on exertion. Negative for leg swelling and syncope. Claudication: very minimal with over exertion.  Respiratory: Negative for hemoptysis and wheezing.   Endocrine: Negative for cold intolerance and heat intolerance.  Skin: Negative for nail changes.  Musculoskeletal: Negative for myalgias.  Gastrointestinal: Negative for abdominal pain, nausea and vomiting.  Neurological: Negative for difficulty with concentration, dizziness, focal weakness and headaches.  Psychiatric/Behavioral: Negative for altered mental status and suicidal  ideas.  All other systems reviewed and are negative.     Objective  Blood pressure (!) 137/95, pulse 87, height '6\' 2"'$  (1.88 m), weight 235 lb (106.6 kg). Body mass index is 30.17 kg/m.    Physical Exam  Constitutional: He is oriented to person, place, and time. Vital signs are normal. He appears well-developed and well-nourished.  HENT:  Head: Normocephalic and atraumatic.  Neck: Normal range of motion.  Cardiovascular: Normal rate, regular rhythm, normal heart sounds and intact distal pulses.  Trace bilateral leg edema  Pulmonary/Chest: Effort normal and breath sounds normal. No accessory muscle usage. No respiratory distress.  Abdominal: Soft. Bowel sounds are normal.  Musculoskeletal: Normal range of motion.  Neurological: He is alert and oriented to person, place, and time.  Skin: Skin is warm and dry.  Vitals reviewed.  Radiology: No results found.  Laboratory examination:  PCP labs 05/16/2018: Creatinine 1.4, K 3.9, eGFR 56, CMP normal. Liver enzymes normal. Cholesterol 168, triglycerides 329, HDL 29, LDL 73. CBC normal. HgbA1c 6.6%  CMP Latest Ref Rng & Units 06/29/2018 09/20/2016 09/19/2016  Glucose 65 - 99 mg/dL 145(H) 85 83  BUN 6 - 24 mg/dL '11 7 10  '$ Creatinine 0.76 - 1.27 mg/dL 1.25 1.07 1.22  Sodium 134 - 144 mmol/L 142 138 138  Potassium 3.5 - 5.2 mmol/L 4.6 3.6 3.8  Chloride 96 - 106 mmol/L 99 102 102  CO2 20 - 29 mmol/L '24 26 26  '$ Calcium 8.7 - 10.2 mg/dL 10.0 9.1 9.1  Total Protein 6.5 - 8.1 g/dL - 6.5 6.5  Total Bilirubin 0.3 - 1.2 mg/dL - 1.4(H) 1.5(H)  Alkaline Phos 38 - 126 U/L - 150(H) 187(H)  AST 15 - 41 U/L - 52(H) 96(H)  ALT 17 - 63 U/L - 167(H) 246(H)   CBC Latest Ref Rng & Units 09/20/2016 09/19/2016 09/18/2016  WBC 4.0 - 10.5 K/uL 12.2(H) 11.4(H) 5.2  Hemoglobin 13.0 - 17.0 g/dL 14.0 14.6 13.1  Hematocrit 39.0 - 52.0 % 41.9 43.8 39.9  Platelets 150 - 400 K/uL 232 260 236   Lipid Panel  No results found for: CHOL, TRIG, HDL, CHOLHDL, VLDL,  LDLCALC, LDLDIRECT HEMOGLOBIN A1C Lab Results  Component Value Date   HGBA1C 5.2 09/19/2016   MPG 103 09/19/2016   TSH No results for input(s): TSH in the last 8760 hours.  Cardiac Studies:   Echocardiogram 08/11/2016: Left ventricle cavity is normal in size. Mild to moderate decrease in LV systolic function. Visual EF is 40-45%. Global hypokinesis. Normal diastolic filling pattern. Calculated EF 51%. Structurally normal tricuspid valve with trace regurgitation. No evidence of pulmonary hypertension.  Exercise sestamibi stress test 08/03/2016: 1. The resting electrocardiogram demonstrated normal sinus rhythm, normal resting conduction, no resting arrhythmias and normal rest repolarization. The stress electrocardiogram was normal. Patient exercised on Bruce protocol for 10.09 minutes and achieved 12.04 METS. Stress test terminated due to dyspnea and 89% MPHR achieved (Target HR >85%). Excellent effort.  2. The overall quality of the study is good. Left ventricular cavity is noted to be normal on the rest and stress studies. Additionally, the right ventricle is normal. SPECT images demonstrate homogeneous tracer distribution throughout the myocardium. The left ventricular ejection fraction was calculated to be 41%, visually appears normal. This is a low risk study.  Assessment   Essential hypertension  Nonischemic cardiomyopathy ([facility])  Former tobacco use  Claudication of both lower extremities ([place])  EKG 05/16/2018: Normal sinus rhythm at 85 bpm, normal axis, T wave inversion in lead 3; otherwise no evidence of ischemia.   Recommendations:   Patient is presently doing well, continues to have dyspnea on exertion and occasional chest discomfort.  He was scheduled for echocardiogram after his last visit; however, has not been performed yet due to COVID restrictions.  We will try to have this performed prior to his next office visit to follow-up on nonischemic cardiomyopathy as he  has now been on guideline directed therapy.  Kidney function has remained stable.  We will continue with Aldactone.  Blood pressure unfortunately did not significantly improve with this, but will continue with Aldactone for CHF therapy.  I will further increase his valsartan hydrochlorothiazide to 320-25 mg daily.  He is complaining of symptoms of claudication, given his former tobacco use will try to have ABI performed at the same time of his echocardiogram for further evaluation.  He will likely need  lower extremity duplex.  I have reviewed recently obtained labs from PCP office in February, cholesterol was fairly well controlled with Zocor.  Has elevated triglycerides purely related to diet.  Diet modifications were discussed.  Previously noted liver enzyme elevations have resolved.  I encouraged him to continue with regular exercise.  I will see him back after the test for further recommendations and reevaluation.  Miquel Dunn, MD, [facility] 08/12/2018, 9:27 AM [facility]. [place] Pager: [phone] Office: [phone] If no answer Cell [phone]

## 2018-08-19 ENCOUNTER — Ambulatory Visit (INDEPENDENT_AMBULATORY_CARE_PROVIDER_SITE_OTHER): Payer: BC Managed Care – PPO

## 2018-08-19 ENCOUNTER — Other Ambulatory Visit: Payer: Self-pay

## 2018-08-19 DIAGNOSIS — I428 Other cardiomyopathies: Secondary | ICD-10-CM | POA: Diagnosis not present

## 2018-08-19 DIAGNOSIS — I739 Peripheral vascular disease, unspecified: Secondary | ICD-10-CM | POA: Diagnosis not present

## 2018-09-09 ENCOUNTER — Other Ambulatory Visit: Payer: Self-pay | Admitting: Cardiology

## 2018-09-09 NOTE — Telephone Encounter (Signed)
Please fill

## 2018-09-26 ENCOUNTER — Other Ambulatory Visit: Payer: Self-pay

## 2018-09-26 ENCOUNTER — Ambulatory Visit: Payer: BLUE CROSS/BLUE SHIELD | Admitting: Cardiology

## 2018-09-26 ENCOUNTER — Encounter: Payer: Self-pay | Admitting: Cardiology

## 2018-09-26 VITALS — BP 114/85 | Ht 74.0 in | Wt 230.0 lb

## 2018-09-26 DIAGNOSIS — R0609 Other forms of dyspnea: Secondary | ICD-10-CM

## 2018-09-26 DIAGNOSIS — M48062 Spinal stenosis, lumbar region with neurogenic claudication: Secondary | ICD-10-CM | POA: Diagnosis not present

## 2018-09-26 DIAGNOSIS — I10 Essential (primary) hypertension: Secondary | ICD-10-CM | POA: Diagnosis not present

## 2018-09-26 DIAGNOSIS — R079 Chest pain, unspecified: Secondary | ICD-10-CM

## 2018-09-26 NOTE — Progress Notes (Signed)
Primary Physician/Referring:  Jani Gravel, MD  Patient ID: Brandon Hogan, male    DOB: 09-Apr-1970, 48 y.o.   MRN: [id number]  Chief Complaint  Patient presents with   Hypertension   Follow-up   This visit type was conducted due to national recommendations for restrictions regarding the COVID-19 Pandemic (e.g. social distancing).  This format is felt to be most appropriate for this patient at this time.  All issues noted in this document were discussed and addressed.  No physical exam was performed (except for noted visual exam findings with Telehealth visits).  The patient has consented to conduct a Telehealth visit and understands insurance will be billed.   I discussed the limitations of evaluation and management by telemedicine and the availability of in person appointments. The patient expressed understanding and agreed to proceed.  Virtual Visit via Video Note is as below  I connected with Brandon Hogan, on 09/27/18 at 1420 by a video enabled telemedicine application and verified that I am speaking with the correct person using two identifiers.     I have discussed with her regarding the safety during COVID Pandemic and steps and precautions including social distancing with the patient.    HPI: Brandon Hogan  is a 48 y.o. male  with hypertension, hyperlipidemia, family history of premature CAD, type II diabetes mellitus, was last seen in 08/2016. His prior workup showed mildly reduced EF of 40-45%. He had excellent exercise capacity on stress test with 12 minutes and no ischemia/infarct.  Underwent echocardiogram and lower extremity duplex and now presents for results. He has now completely quit smoking and using vape and he attributes improvement in symptoms to this. Occasional alcohol use. Occasional chest pain with exertion, but not every time he exerts himself. Does not stop him from doing activities. Continues to have dyspnea on exertion. He does notice pain in  bilateral legs with over exertion.   Blood pressure has improved with increased dose of Valsartan HCT.   By PCP labs, HgbA1c is 6.6%. Triglycerides elevated. He has been trying to walk more recently and admits he could make diet changes.  Past Medical History:  Diagnosis Date   Cardiomyopathy ([place])    Diabetes mellitus without complication ([place])    Gallstone    Hypertension     Past Surgical History:  Procedure Laterality Date   CHOLECYSTECTOMY N/A 09/18/2016   Procedure: LAPAROSCOPIC CHOLECYSTECTOMY;  Surgeon: Stark Klein, MD;  Location: [facility];  Service: General;  Laterality: N/A;   ERCP N/A 09/18/2016   Procedure: ENDOSCOPIC RETROGRADE CHOLANGIOPANCREATOGRAPHY (ERCP);  Surgeon: Gatha Mayer, MD;  Location: [facility];  Service: Endoscopy;  Laterality: N/A;    Social History   Socioeconomic History   Marital status: Married    Spouse name: Not on file   Number of children: 2   Years of education: Not on file   Highest education level: Not on file  Occupational History   Not on file  Social Needs   Financial resource strain: Not on file   Food insecurity    Worry: Not on file    Inability: Not on file   Transportation needs    Medical: Not on file    Non-medical: Not on file  Tobacco Use   Smoking status: Former Smoker    Quit date: 05/17/2011    Years since quitting: 7.3   Smokeless tobacco: Never Used  Substance and Sexual Activity   Alcohol use: Yes    Comment: rare   Drug  use: No   Sexual activity: Not on file  Lifestyle   Physical activity    Days per week: Not on file    Minutes per session: Not on file   Stress: Not on file  Relationships   Social connections    Talks on phone: Not on file    Gets together: Not on file    Attends religious service: Not on file    Active member of club or organization: Not on file    Attends meetings of clubs or organizations: Not on file    Relationship status: Not on file   Intimate partner  violence    Fear of current or ex partner: Not on file    Emotionally abused: Not on file    Physically abused: Not on file    Forced sexual activity: Not on file  Other Topics Concern   Not on file  Social History Narrative   Not on file    Current Outpatient Medications on File Prior to Visit  Medication Sig Dispense Refill   allopurinol (ZYLOPRIM) 100 MG tablet Take 100 mg by mouth 2 (two) times daily.     ibuprofen (ADVIL,MOTRIN) 400 MG tablet Take 1 tablet (400 mg total) by mouth every 6 (six) hours as needed for fever, headache or mild pain. 30 tablet 0   metFORMIN (GLUCOPHAGE-XR) 500 MG 24 hr tablet Take 500 mg by mouth every morning.  4   metoprolol succinate (TOPROL-XL) 50 MG 24 hr tablet TAKE 1 TABLET BY MOUTH DAILY 30 tablet 6   simvastatin (ZOCOR) 20 MG tablet Take 20 mg by mouth daily.     spironolactone (ALDACTONE) 25 MG tablet TAKE 1 TABLET(25 MG) BY MOUTH DAILY 30 tablet 2   valsartan-hydrochlorothiazide (DIOVAN-HCT) 320-25 MG tablet Take 1 tablet by mouth daily. 30 tablet 2   No current facility-administered medications on file prior to visit.     Review of Systems  Constitution: Negative for decreased appetite, malaise/fatigue, weight gain and weight loss.  Eyes: Negative for visual disturbance.  Cardiovascular: Positive for chest pain (occasional; improved), claudication (very minimal with over exertion) and dyspnea on exertion. Negative for leg swelling and syncope.  Respiratory: Negative for hemoptysis and wheezing.   Endocrine: Negative for cold intolerance and heat intolerance.  Skin: Negative for nail changes.  Musculoskeletal: Positive for back pain. Negative for myalgias.  Gastrointestinal: Negative for abdominal pain, nausea and vomiting.  Neurological: Negative for difficulty with concentration, dizziness, focal weakness and headaches.  Psychiatric/Behavioral: Negative for altered mental status and suicidal ideas.  All other systems reviewed and  are negative.     Objective  Blood pressure 114/85, height '6\' 2"'$  (1.88 m), weight 230 lb (104.3 kg). Body mass index is 29.53 kg/m.    Physical Exam  Constitutional: He is oriented to person, place, and time. Vital signs are normal. He appears well-developed and well-nourished.  HENT:  Head: Normocephalic and atraumatic.  Neck: Normal range of motion.  Cardiovascular: Normal rate, regular rhythm, normal heart sounds and intact distal pulses.  Trace bilateral leg edema  Pulmonary/Chest: Effort normal and breath sounds normal. No accessory muscle usage. No respiratory distress.  Abdominal: Soft. Bowel sounds are normal.  Musculoskeletal: Normal range of motion.  Neurological: He is alert and oriented to person, place, and time.  Skin: Skin is warm and dry.  Vitals reviewed.  Radiology: No results found.  Laboratory examination:   PCP labs 05/16/2018: Creatinine 1.4, K 3.9, eGFR 56, CMP normal. Liver enzymes normal. Cholesterol  168, triglycerides 329, HDL 29, LDL 73. CBC normal. HgbA1c 6.6%  CMP Latest Ref Rng & Units 06/29/2018 09/20/2016 09/19/2016  Glucose 65 - 99 mg/dL 145(H) 85 83  BUN 6 - 24 mg/dL '11 7 10  '$ Creatinine 0.76 - 1.27 mg/dL 1.25 1.07 1.22  Sodium 134 - 144 mmol/L 142 138 138  Potassium 3.5 - 5.2 mmol/L 4.6 3.6 3.8  Chloride 96 - 106 mmol/L 99 102 102  CO2 20 - 29 mmol/L '24 26 26  '$ Calcium 8.7 - 10.2 mg/dL 10.0 9.1 9.1  Total Protein 6.5 - 8.1 g/dL - 6.5 6.5  Total Bilirubin 0.3 - 1.2 mg/dL - 1.4(H) 1.5(H)  Alkaline Phos 38 - 126 U/L - 150(H) 187(H)  AST 15 - 41 U/L - 52(H) 96(H)  ALT 17 - 63 U/L - 167(H) 246(H)   CBC Latest Ref Rng & Units 09/20/2016 09/19/2016 09/18/2016  WBC 4.0 - 10.5 K/uL 12.2(H) 11.4(H) 5.2  Hemoglobin 13.0 - 17.0 g/dL 14.0 14.6 13.1  Hematocrit 39.0 - 52.0 % 41.9 43.8 39.9  Platelets 150 - 400 K/uL 232 260 236   Lipid Panel  No results found for: CHOL, TRIG, HDL, CHOLHDL, VLDL, LDLCALC, LDLDIRECT HEMOGLOBIN A1C Lab Results  Component  Value Date   HGBA1C 5.2 09/19/2016   MPG 103 09/19/2016   TSH No results for input(s): TSH in the last 8760 hours.  Cardiac Studies:   Lower Extremity Arterial Duplex 08/19/2018: No hemodynamically significant stenoses are identified in the  lower extremity arterial system.  This exam reveals normal perfusion of the t lower extremity (RABI 0.98 and LABI 1.01) with mildly abnormal biphasic waveform at the ankle.  Study may suggest mild small vessel disease. Consider evaluation for pseudoclaudication.  Echocardiogram 08/19/2018: Normal LV systolic function with EF 55%. Left ventricle cavity is normal in size. Normal global wall motion. Doppler evidence of grade I (impaired) diastolic dysfunction, normal LAP. Calculated EF 55%. No hemodynamically significant valvular abnormalities.  Inadequate TR jet to estimate pulmonary artery systolic pressure. Normal right atrial pressure. Compared to previous echocardiogram on 08/11/2016, LVEF is mildly improved.   Exercise sestamibi stress test 08/03/2016: 1. The resting electrocardiogram demonstrated normal sinus rhythm, normal resting conduction, no resting arrhythmias and normal rest repolarization. The stress electrocardiogram was normal. Patient exercised on Bruce protocol for 10.09 minutes and achieved 12.04 METS. Stress test terminated due to dyspnea and 89% MPHR achieved (Target HR >85%). Excellent effort.  2. The overall quality of the study is good. Left ventricular cavity is noted to be normal on the rest and stress studies. Additionally, the right ventricle is normal. SPECT images demonstrate homogeneous tracer distribution throughout the myocardium. The left ventricular ejection fraction was calculated to be 41%, visually appears normal. This is a low risk study.  Assessment     ICD-10-CM   1. Essential hypertension  I10   2. Dyspnea on exertion  R06.09   3. Exertional chest pain  R07.9   4. Pseudoclaudication  [id number]     EKG  05/16/2018: Normal sinus rhythm at 85 bpm, normal axis, T wave inversion in lead 3; otherwise no evidence of ischemia.   Recommendations:   I discussed recently obtained echocardiogram results with the patient, previously noted nonischemic cardiomyopathy has now resolved as his LVEF is 55%.  He continues to have grade 1 diastolic dysfunction related to hypertension.  I recommended continued aggressive blood pressure control as well as controlling other risk factors.  I suspect his dyspnea on exertion is related to hypertension, weight,  and inactivity.  Encouraged him to try to exercise daily.  Has occasional exertional chest pain, but patient does not seem to be bothered by chest discomfort and does not keep him from doing activities.  Has had normal nuclear stress test in 2018. He likely has small vessel disease, continue with medical therapy.  Lower extremity duplex results were discussed with the patient, had normal bilateral ABI with mildly abnormal biphasic waveform at the ankle, suggestive of small vessel disease.  Would recommend continued aggressive medical management of lipids and regular exercise.  Suspect small vessel disease related to previous tobacco use, encouraged him to continue with complete abstinence.  May also consider evaluation for pseudoclaudication as he does have chronic back pain.  Will defer to PCP.  His blood pressure has significantly improved, he is to see his PCP in August and will have labs performed at that time.  I will see him in 6 months or sooner if problems.  Miquel Dunn, MSN, APRN, FNP-C [facility]. [place] Office: [phone] Fax: [phone]

## 2018-09-27 ENCOUNTER — Encounter: Payer: Self-pay | Admitting: Cardiology

## 2018-12-13 ENCOUNTER — Other Ambulatory Visit: Payer: Self-pay | Admitting: Cardiology

## 2018-12-19 ENCOUNTER — Other Ambulatory Visit: Payer: Self-pay | Admitting: Cardiology

## 2019-01-11 ENCOUNTER — Other Ambulatory Visit: Payer: Self-pay

## 2019-01-11 MED ORDER — VALSARTAN-HYDROCHLOROTHIAZIDE 320-25 MG PO TABS: 1.0000 | ORAL_TABLET | Freq: Every day | ORAL | 2 refills | Status: DC

## 2019-03-13 ENCOUNTER — Other Ambulatory Visit: Payer: Self-pay

## 2019-03-13 ENCOUNTER — Encounter: Payer: Self-pay | Admitting: Cardiology

## 2019-03-13 ENCOUNTER — Ambulatory Visit: Payer: BC Managed Care – PPO | Admitting: Cardiology

## 2019-03-13 VITALS — BP 128/80 | HR 84 | Temp 98.0°F | Ht 73.0 in | Wt 224.0 lb

## 2019-03-13 DIAGNOSIS — Z87891 Personal history of nicotine dependence: Secondary | ICD-10-CM

## 2019-03-13 DIAGNOSIS — E782 Mixed hyperlipidemia: Secondary | ICD-10-CM | POA: Diagnosis not present

## 2019-03-13 DIAGNOSIS — I10 Essential (primary) hypertension: Secondary | ICD-10-CM | POA: Diagnosis not present

## 2019-03-13 DIAGNOSIS — I739 Peripheral vascular disease, unspecified: Secondary | ICD-10-CM

## 2019-03-13 NOTE — Progress Notes (Signed)
Primary Physician/Referring:  Jani Gravel, MD  Patient ID: Brandon Hogan, male    DOB: 1971/02/16, 48 y.o.   MRN: [id number]  Chief Complaint  Patient presents with  . Hypertension  . Coronary Artery Disease  . Follow-up    6 month    HPI: Brandon Hogan  is a 48 y.o. male  with hypertension, hyperlipidemia, family history of premature CAD, type II diabetes mellitus, history of mildly reduced EF of 40-45% that had resolved by last echocardiogram in May 2020. He had excellent exercise capacity on stress test with 12 minutes and no ischemia/infarct.  He now presents for 6 month follow up. Occasional chest pain with exertion, but not every time he exerts himself. Does not stop him from doing activities. Continues to have dyspnea on exertion that is unchanged. He does notice pain in bilateral hips with over exertion. Has had lower extremity arterial duplex in may 2020 that was without any significant stenosis.   Blood pressure has been stable. Tolerating medications.   He reports by recent labs with PCP, his diabetes is better controlled. He has been walking regularly for exercise and has made diet changes.  He has now completely quit smoking and using vape and he attributes improvement in symptoms to this. Occasional alcohol use  Past Medical History:  Diagnosis Date  . Cardiomyopathy ([place])   . Diabetes mellitus without complication ([place])   . Gallstone   . Hypertension     Past Surgical History:  Procedure Laterality Date  . CHOLECYSTECTOMY N/A 09/18/2016   Procedure: LAPAROSCOPIC CHOLECYSTECTOMY;  Surgeon: Stark Klein, MD;  Location: [facility];  Service: General;  Laterality: N/A;  . ERCP N/A 09/18/2016   Procedure: ENDOSCOPIC RETROGRADE CHOLANGIOPANCREATOGRAPHY (ERCP);  Surgeon: Gatha Mayer, MD;  Location: [facility];  Service: Endoscopy;  Laterality: N/A;    Social History   Socioeconomic History  . Marital status: Married    Spouse name: Not on file  . Number  of children: 2  . Years of education: Not on file  . Highest education level: Not on file  Occupational History  . Not on file  Social Needs  . Financial resource strain: Not on file  . Food insecurity    Worry: Not on file    Inability: Not on file  . Transportation needs    Medical: Not on file    Non-medical: Not on file  Tobacco Use  . Smoking status: Former Smoker    Packs/day: 2.00    Years: 25.00    Pack years: 50.00    Types: Cigarettes    Quit date: 05/17/2011    Years since quitting: 7.8  . Smokeless tobacco: Never Used  Substance and Sexual Activity  . Alcohol use: Yes    Comment: rare  . Drug use: No  . Sexual activity: Not on file  Lifestyle  . Physical activity    Days per week: Not on file    Minutes per session: Not on file  . Stress: Not on file  Relationships  . Social Herbalist on phone: Not on file    Gets together: Not on file    Attends religious service: Not on file    Active member of club or organization: Not on file    Attends meetings of clubs or organizations: Not on file    Relationship status: Not on file  . Intimate partner violence    Fear of current or ex partner: Not on file  Emotionally abused: Not on file    Physically abused: Not on file    Forced sexual activity: Not on file  Other Topics Concern  . Not on file  Social History Narrative  . Not on file    Current Outpatient Medications on File Prior to Visit  Medication Sig Dispense Refill  . allopurinol (ZYLOPRIM) 100 MG tablet Take 100 mg by mouth 2 (two) times daily.    . fenofibrate (TRICOR) 48 MG tablet Take 48 mg by mouth daily.    Marland Kitchen glimepiride (AMARYL) 2 MG tablet Take 2 mg by mouth every morning.    Marland Kitchen ibuprofen (ADVIL,MOTRIN) 400 MG tablet Take 1 tablet (400 mg total) by mouth every 6 (six) hours as needed for fever, headache or mild pain. 30 tablet 0  . JANUVIA 50 MG tablet Take 50 mg by mouth daily.    . metFORMIN (GLUCOPHAGE-XR) 500 MG 24 hr tablet  Take 500 mg by mouth every morning.  4  . metoprolol succinate (TOPROL-XL) 50 MG 24 hr tablet TAKE 1 TABLET BY MOUTH DAILY 30 tablet 6  . simvastatin (ZOCOR) 20 MG tablet Take 20 mg by mouth daily.    Marland Kitchen spironolactone (ALDACTONE) 25 MG tablet TAKE 1 TABLET(25 MG) BY MOUTH DAILY 30 tablet 2  . valsartan-hydrochlorothiazide (DIOVAN-HCT) 320-25 MG tablet Take 1 tablet by mouth daily. 90 tablet 2   No current facility-administered medications on file prior to visit.     Review of Systems  Constitution: Negative for decreased appetite, malaise/fatigue, weight gain and weight loss.  Eyes: Negative for visual disturbance.  Cardiovascular: Positive for chest pain (occasional; improved), claudication (very minimal with over exertion) and dyspnea on exertion. Negative for leg swelling and syncope.  Respiratory: Negative for hemoptysis and wheezing.   Endocrine: Negative for cold intolerance and heat intolerance.  Skin: Negative for nail changes.  Musculoskeletal: Positive for back pain. Negative for myalgias.  Gastrointestinal: Negative for abdominal pain, nausea and vomiting.  Neurological: Negative for difficulty with concentration, dizziness, focal weakness and headaches.  Psychiatric/Behavioral: Negative for altered mental status and suicidal ideas.  All other systems reviewed and are negative.     Objective  Blood pressure 128/80, pulse 84, temperature 98 F (36.7 C), height _0  (1.854 m), weight 224 lb (101.6 kg), SpO2 99 %. Body mass index is 29.55 kg/m.    Physical Exam  Constitutional: He is oriented to person, place, and time. Vital signs are normal. He appears well-developed and well-nourished.  HENT:  Head: Normocephalic and atraumatic.  Neck: Normal range of motion.  Cardiovascular: Normal rate, regular rhythm, normal heart sounds and intact distal pulses.  Pulses:      Dorsalis pedis pulses are 2+ on the right side and 2+ on the left side.       Posterior tibial pulses are  2+ on the right side and 2+ on the left side.  Trace bilateral leg edema  Pulmonary/Chest: Effort normal and breath sounds normal. No accessory muscle usage. No respiratory distress.  Abdominal: Soft. Bowel sounds are normal.  Musculoskeletal: Normal range of motion.  Neurological: He is alert and oriented to person, place, and time.  Skin: Skin is warm and dry.  Vitals reviewed.  Radiology: No results found.  Laboratory examination:   PCP labs 05/16/2018: Creatinine 1.4, K 3.9, eGFR 56, CMP normal. Liver enzymes normal. Cholesterol 168, triglycerides 329, HDL 29, LDL 73. CBC normal. HgbA1c 6.6%  CMP Latest Ref Rng & Units 06/29/2018 09/20/2016 09/19/2016  Glucose 65 -  99 mg/dL 145(H) 85 83  BUN 6 - 24 mg/dL _0 Creatinine 0.76 - 1.27 mg/dL 1.25 1.07 1.22  Sodium 134 - 144 mmol/L 142 138 138  Potassium 3.5 - 5.2 mmol/L 4.6 3.6 3.8  Chloride 96 - 106 mmol/L 99 102 102  CO2 20 - 29 mmol/L _1 Calcium 8.7 - 10.2 mg/dL 10.0 9.1 9.1  Total Protein 6.5 - 8.1 g/dL - 6.5 6.5  Total Bilirubin 0.3 - 1.2 mg/dL - 1.4(H) 1.5(H)  Alkaline Phos 38 - 126 U/L - 150(H) 187(H)  AST 15 - 41 U/L - 52(H) 96(H)  ALT 17 - 63 U/L - 167(H) 246(H)   CBC Latest Ref Rng & Units 09/20/2016 09/19/2016 09/18/2016  WBC 4.0 - 10.5 K/uL 12.2(H) 11.4(H) 5.2  Hemoglobin 13.0 - 17.0 g/dL 14.0 14.6 13.1  Hematocrit 39.0 - 52.0 % 41.9 43.8 39.9  Platelets 150 - 400 K/uL 232 260 236   Lipid Panel  No results found for: CHOL, TRIG, HDL, CHOLHDL, VLDL, LDLCALC, LDLDIRECT HEMOGLOBIN A1C Lab Results  Component Value Date   HGBA1C 5.2 09/19/2016   MPG 103 09/19/2016   TSH No results for input(s): TSH in the last 8760 hours.  Cardiac Studies:   Lower Extremity Arterial Duplex 08/19/2018: No hemodynamically significant stenoses are identified in the  lower extremity arterial system.  This exam reveals normal perfusion of the t lower extremity (RABI 0.98 and LABI 1.01) with mildly abnormal biphasic waveform  at the ankle.  Study may suggest mild small vessel disease. Consider evaluation for pseudoclaudication.  Echocardiogram 08/19/2018: Normal LV systolic function with EF 55%. Left ventricle cavity is normal in size. Normal global wall motion. Doppler evidence of grade I (impaired) diastolic dysfunction, normal LAP. Calculated EF 55%. No hemodynamically significant valvular abnormalities.  Inadequate TR jet to estimate pulmonary artery systolic pressure. Normal right atrial pressure. Compared to previous echocardiogram on 08/11/2016, LVEF is mildly improved.   Exercise sestamibi stress test 08/03/2016: 1. The resting electrocardiogram demonstrated normal sinus rhythm, normal resting conduction, no resting arrhythmias and normal rest repolarization. The stress electrocardiogram was normal. Patient exercised on Bruce protocol for 10.09 minutes and achieved 12.04 METS. Stress test terminated due to dyspnea and 89% MPHR achieved (Target HR >85%). Excellent effort.  2. The overall quality of the study is good. Left ventricular cavity is noted to be normal on the rest and stress studies. Additionally, the right ventricle is normal. SPECT images demonstrate homogeneous tracer distribution throughout the myocardium. The left ventricular ejection fraction was calculated to be 41%, visually appears normal. This is a low risk study.  Assessment     ICD-10-CM   1. Essential hypertension  I10 EKG 12-Lead  2. Mixed hyperlipidemia  E78.2   3. Former tobacco use  Z87.891   4. Claudication of both lower extremities ([place])  I73.9     EKG 03/13/2019: Normal sinus rhythm at 74 bpm, normal axis, no evidence of ischemia.    Recommendations:   Patient is here for 6 month follow up. He is doing well without any complaints, blood pressure is well controlled. He continues to have occasional chest pain and dyspnea with over exertion that is stable. I have recommended that he continue to try to be more active to  see if this will improve. Continue with medical therapy.   He does have bilateral hip claudication. Has normal vascular exam and recent lower extremity duplex suggest small vessel disease. I suspect his symptoms may be related to  pseudoclaudication from chronic back pain. Would consider evaluation for this. I have discussed starting Pletal therapy to see if he would have improvement in symptoms, he wishes to hold off for now. He will contact me if he has any worsening symptoms of claudication.   I will request recent labs from PCP office to follow up on hyperlipidemia. I have congratulated him on remaining abstinent from tobacco and vaping. I will see him back in 6 months for follow up or sooner if needed.   Miquel Dunn, MSN, APRN, FNP-C [facility]. [place] Office: [phone] Fax: [phone]

## 2019-03-28 ENCOUNTER — Ambulatory Visit: Payer: BC Managed Care – PPO | Admitting: Cardiology

## 2019-04-11 ENCOUNTER — Other Ambulatory Visit: Payer: Self-pay | Admitting: Cardiology

## 2019-06-12 ENCOUNTER — Other Ambulatory Visit: Payer: Self-pay | Admitting: Cardiology

## 2019-06-12 DIAGNOSIS — I10 Essential (primary) hypertension: Secondary | ICD-10-CM

## 2019-09-12 ENCOUNTER — Ambulatory Visit: Payer: BC Managed Care – PPO | Admitting: Cardiology

## 2019-09-13 ENCOUNTER — Ambulatory Visit: Payer: BC Managed Care – PPO | Admitting: Cardiology

## 2019-09-29 ENCOUNTER — Ambulatory Visit: Payer: BC Managed Care – PPO | Admitting: Cardiology

## 2019-09-30 ENCOUNTER — Other Ambulatory Visit: Payer: Self-pay | Admitting: Cardiology

## 2019-10-02 ENCOUNTER — Other Ambulatory Visit: Payer: Self-pay | Admitting: Cardiology

## 2020-01-02 ENCOUNTER — Other Ambulatory Visit: Payer: Self-pay

## 2020-01-02 ENCOUNTER — Other Ambulatory Visit: Payer: Self-pay | Admitting: Cardiology

## 2020-01-02 MED ORDER — METOPROLOL SUCCINATE ER 50 MG PO TB24: 50.0000 mg | ORAL_TABLET | Freq: Every day | ORAL | 0 refills | Status: DC

## 2020-01-27 ENCOUNTER — Other Ambulatory Visit: Payer: Self-pay | Admitting: Cardiology

## 2020-04-14 ENCOUNTER — Other Ambulatory Visit: Payer: Self-pay | Admitting: Cardiology

## 2021-05-11 ENCOUNTER — Encounter (HOSPITAL_COMMUNITY): Payer: Self-pay

## 2021-05-11 ENCOUNTER — Emergency Department (HOSPITAL_COMMUNITY)
Admission: EM | Admit: 2021-05-11 | Discharge: 2021-05-11 | Disposition: A | Discharge status: Home / Self Care | Payer: BC Managed Care – PPO | Attending: Emergency Medicine | Admitting: Emergency Medicine

## 2021-05-11 ENCOUNTER — Emergency Department (HOSPITAL_COMMUNITY): Payer: BC Managed Care – PPO

## 2021-05-11 DIAGNOSIS — E876 Hypokalemia: Secondary | ICD-10-CM | POA: Insufficient documentation

## 2021-05-11 DIAGNOSIS — Z7984 Long term (current) use of oral hypoglycemic drugs: Secondary | ICD-10-CM | POA: Diagnosis not present

## 2021-05-11 DIAGNOSIS — I10 Essential (primary) hypertension: Secondary | ICD-10-CM | POA: Insufficient documentation

## 2021-05-11 DIAGNOSIS — Z79899 Other long term (current) drug therapy: Secondary | ICD-10-CM | POA: Diagnosis not present

## 2021-05-11 DIAGNOSIS — R079 Chest pain, unspecified: Secondary | ICD-10-CM

## 2021-05-11 DIAGNOSIS — E119 Type 2 diabetes mellitus without complications: Secondary | ICD-10-CM | POA: Diagnosis not present

## 2021-05-11 DIAGNOSIS — R0789 Other chest pain: Secondary | ICD-10-CM | POA: Diagnosis present

## 2021-05-11 LAB — BASIC METABOLIC PANEL
Anion gap: 10 (ref 5–15)
BUN: 14 mg/dL (ref 6–20)
CO2: 23 mmol/L (ref 22–32)
Calcium: 9.4 mg/dL (ref 8.9–10.3)
Chloride: 104 mmol/L (ref 98–111)
Creatinine, Ser: 1.21 mg/dL (ref 0.61–1.24)
GFR, Estimated: >60 mL/min (ref >60)
Glucose, Bld: 119 mg/dL — ABNORMAL HIGH (ref 70–99)
Potassium: 2.7 mmol/L — CL (ref 3.5–5.1)
Sodium: 137 mmol/L (ref 135–145)

## 2021-05-11 LAB — CBC WITH DIFFERENTIAL/PLATELET
Abs Immature Granulocytes: 0.03 10*3/uL (ref 0.00–0.07)
Basophils Absolute: 0.1 10*3/uL (ref 0.0–0.1)
Basophils Relative: 1 %
Eosinophils Absolute: 0.1 10*3/uL (ref 0.0–0.5)
Eosinophils Relative: 1 %
HCT: 43.8 % (ref 39.0–52.0)
Hemoglobin: 15.6 g/dL (ref 13.0–17.0)
Immature Granulocytes: 0 %
Lymphocytes Relative: 19 %
Lymphs Abs: 2.2 10*3/uL (ref 0.7–4.0)
MCH: 30.3 pg (ref 26.0–34.0)
MCHC: 35.6 g/dL (ref 30.0–36.0)
MCV: 85 fL (ref 80.0–100.0)
Monocytes Absolute: 0.8 10*3/uL (ref 0.1–1.0)
Monocytes Relative: 7 %
Neutro Abs: 8.4 10*3/uL — ABNORMAL HIGH (ref 1.7–7.7)
Neutrophils Relative %: 72 %
Platelets: 278 10*3/uL (ref 150–400)
RBC: 5.15 MIL/uL (ref 4.22–5.81)
RDW: 12.9 % (ref 11.5–15.5)
WBC: 11.6 10*3/uL — ABNORMAL HIGH (ref 4.0–10.5)
nRBC: 0 % (ref 0.0–0.2)

## 2021-05-11 LAB — TROPONIN I (HIGH SENSITIVITY)
Troponin I (High Sensitivity): 3 ng/L (ref <18)
Troponin I (High Sensitivity): 4 ng/L (ref <18)

## 2021-05-11 LAB — TSH: TSH: 1.65 u[IU]/mL (ref 0.350–4.500)

## 2021-05-11 MED ORDER — POTASSIUM CHLORIDE 10 MEQ/100ML IV SOLN
10.0000 meq | Freq: Once | INTRAVENOUS | Status: AC
  Administered 2021-05-11: 10 meq via INTRAVENOUS
  Filled 2021-05-11: qty 100

## 2021-05-11 MED ORDER — POTASSIUM CHLORIDE CRYS ER 20 MEQ PO TBCR: 20.0000 meq | EXTENDED_RELEASE_TABLET | Freq: Two times a day (BID) | ORAL | 0 refills | Status: DC

## 2021-05-11 MED ORDER — POTASSIUM CHLORIDE CRYS ER 20 MEQ PO TBCR
40.0000 meq | EXTENDED_RELEASE_TABLET | Freq: Once | ORAL | Status: AC
  Administered 2021-05-11: 40 meq via ORAL
  Filled 2021-05-11: qty 2

## 2021-05-11 NOTE — ED Notes (Signed)
EMS gave 324 ASA, and 1 nitro no relief

## 2021-05-11 NOTE — Discharge Instructions (Addendum)
Follow-up with cardiology as planned.  Also you will need your potassium rechecked.  It is a little low now and will be supplemented for 4 days.

## 2021-05-11 NOTE — ED Triage Notes (Signed)
Chest pain/ Pressure onset 1430 today pt said he was nauseous and hot flashes.  Skin warm and clammy.  Pt alert and oriented.

## 2021-05-11 NOTE — ED Provider Notes (Signed)
[facility] EMERGENCY DEPARTMENT Provider Note   CSN: [id number] Arrival date & time: 05/11/21  1603     History  Chief Complaint  Patient presents with   Chest Pain    Brandon Hogan is a 51 y.o. male.   Chest Pain Associated symptoms: nausea and palpitations   Associated symptoms: no abdominal pain, no back pain, no shortness of breath and no weakness   Patient presents with chest pain.  Anterior chest.  States he had an episode on Tuesday with today being Sunday.  States also an episode today.  States he felt hot and cold.  Also felt some nausea and vomiting.  States he was clammy.  No known history of cardiac disease.  Is diabetic.  No fevers or chills.  He is wondering if anxiety is a component of this.   Past Medical History:  Diagnosis Date   Cardiomyopathy ([facility])    Diabetes mellitus without complication ([facility])    Gallstone    Hypertension     Home Medications Prior to Admission medications   Medication Sig Start Date End Date Taking? Authorizing Provider  potassium chloride SA (KLOR-CON M) 20 MEQ tablet Take 1 tablet (20 mEq total) by mouth 2 (two) times daily. 05/11/21  Yes Benjiman Core, MD  allopurinol (ZYLOPRIM) 100 MG tablet Take 100 mg by mouth 2 (two) times daily.    [provider]  fenofibrate (TRICOR) 48 MG tablet Take 48 mg by mouth daily. 02/24/19   [provider]  glimepiride (AMARYL) 2 MG tablet Take 2 mg by mouth every morning. 02/19/19   [provider]  ibuprofen (ADVIL,MOTRIN) 400 MG tablet Take 1 tablet (400 mg total) by mouth every 6 (six) hours as needed for fever, headache or mild pain. 09/20/16   Rodolph Bong, MD  JANUVIA 50 MG tablet Take 50 mg by mouth daily. 01/29/19   [provider]  metFORMIN (GLUCOPHAGE-XR) 500 MG 24 hr tablet Take 500 mg by mouth every morning. 09/16/15   [provider]  metoprolol succinate (TOPROL-XL) 50 MG 24 hr tablet Take 1 tablet (50 mg  total) by mouth daily. Take with or immediately following a meal. 01/02/20   Yates Decamp, MD  simvastatin (ZOCOR) 20 MG tablet Take 20 mg by mouth daily. 05/09/18   [provider]  spironolactone (ALDACTONE) 25 MG tablet TAKE 1 TABLET BY MOUTH EVERY DAY 04/11/19   Toniann Fail, NP  valsartan-hydrochlorothiazide (DIOVAN-HCT) 320-25 MG tablet TAKE 1 TABLET BY MOUTH DAILY 10/02/19   [name], [place], DO      Allergies    Patient has no known allergies.    Review of Systems   Review of Systems  Constitutional:  Positive for chills.  Respiratory:  Negative for shortness of breath.   Cardiovascular:  Positive for chest pain and palpitations.  Gastrointestinal:  Positive for nausea. Negative for abdominal pain.  Genitourinary:  Negative for flank pain.  Musculoskeletal:  Negative for back pain.  Skin:  Negative for rash.  Neurological:  Negative for weakness.  Psychiatric/Behavioral:  The patient is nervous/anxious.    Physical Exam Updated Vital Signs BP 123/79    Pulse 88    Temp 98.4 F (36.9 C) (Oral)    Resp (!) 27    Ht 6\' 1"  (1.854 m)    Wt 98 kg    SpO2 100%    BMI 28.50 kg/m  Physical Exam Vitals and nursing note reviewed.  Cardiovascular:     Rate  and Rhythm: Normal rate and regular rhythm.  Pulmonary:     Breath sounds: No wheezing.  Chest:     Chest wall: No tenderness.  Abdominal:     Tenderness: There is no abdominal tenderness.  Musculoskeletal:     Cervical back: Neck supple.  Skin:    General: Skin is warm.     Capillary Refill: Capillary refill takes less than 2 seconds.  Neurological:     Mental Status: He is alert.    ED Results / Procedures / Treatments   Labs (all labs ordered are listed, but only abnormal results are displayed) Labs Reviewed  CBC WITH DIFFERENTIAL/PLATELET - Abnormal; Notable for the following components:      Result Value   WBC 11.6 (*)    Neutro Abs 8.4 (*)    All other components within normal limits  BASIC METABOLIC  PANEL - Abnormal; Notable for the following components:   Potassium 2.7 (*)    Glucose, Bld 119 (*)    All other components within normal limits  TSH  TROPONIN I (HIGH SENSITIVITY)  TROPONIN I (HIGH SENSITIVITY)    EKG EKG Interpretation  Date/Time:  Sunday May 11 2021 16:09:39 EST Ventricular Rate:  103 PR Interval:  150 QRS Duration: 89 QT Interval:  351 QTC Calculation: 460 R Axis:   76 Text Interpretation: Sinus tachycardia Confirmed by Elleen Coulibaly ([id number]) on 05/11/2021 4:16:48 PM  Radiology DG Chest Portable 1 View  Result Date: 05/11/2021 CLINICAL DATA:  Chest pain and pressure beginning today. EXAM: PORTABLE CHEST 1 VIEW COMPARISON:  None. FINDINGS: The heart size and mediastinal contours are within normal limits. Both lungs are clear. The visualized skeletal structures are unremarkable. IMPRESSION: No active disease. Electronically Signed   By: Mark  Shogry M.D.   On: 05/11/2021 17:21    Procedures Procedures    Medications Ordered in ED Medications  potassium chloride 10 mEq in 100 mL IVPB (0 mEq Intravenous Stopped 05/11/21 2038)  potassium chloride SA (KLOR-CON M) CR tablet 40 mEq (40 mEq Oral Given 05/11/21 1924)    ED Course/ Medical Decision Making/ A&P                           Medical Decision Making Problems Addressed: Hypokalemia: acute illness or injury  Amount and/or Complexity of Data Reviewed Labs: ordered. Radiology: ordered and independent interpretation performed.    Details: no active disease ECG/medicine tests: independent interpretation performed.  Risk Prescription drug management.  Initial differential diagnosis for chest pain includes cardiac ischemia, arrhythmia, anxiety. Patient presents with chest pain.  Had an episode of chest pain and potentially feeling his heart race.  Felt hot and cold.  Seen by [facility] in the past.  Troponin reassuring.  Negative x2.  Mild hypokalemia potassium 2.7.  Will supplement.   However will only discharged with small dose of potassium since she he is on a potassium sparing diuretic.  Doubt cardiac ischemia.  Doubt severe arrhythmia.  Does have a fair amount of stress and anxiety in his life.        Final Clinical Impression(s) / ED Diagnoses Final diagnoses:  Nonspecific chest pain  Hypokalemia    Rx / DC Orders ED Discharge Orders          Ordered    potassium chloride SA (KLOR-CON M) 20 MEQ tablet  2 times daily        02 /05/23 2033  Benjiman Core, MD 05/11/21 [phone]

## 2021-05-20 ENCOUNTER — Encounter: Payer: Self-pay | Admitting: Cardiology

## 2021-05-20 ENCOUNTER — Other Ambulatory Visit: Payer: Self-pay | Admitting: Cardiology

## 2021-05-20 ENCOUNTER — Other Ambulatory Visit: Payer: Self-pay

## 2021-05-20 ENCOUNTER — Ambulatory Visit: Payer: BC Managed Care – PPO | Admitting: Cardiology

## 2021-05-20 VITALS — BP 154/99 | HR 100 | Temp 98.6°F | Ht 73.0 in | Wt 217.6 lb

## 2021-05-20 DIAGNOSIS — I10 Essential (primary) hypertension: Secondary | ICD-10-CM

## 2021-05-20 DIAGNOSIS — E785 Hyperlipidemia, unspecified: Secondary | ICD-10-CM

## 2021-05-20 DIAGNOSIS — E119 Type 2 diabetes mellitus without complications: Secondary | ICD-10-CM

## 2021-05-20 DIAGNOSIS — Z87891 Personal history of nicotine dependence: Secondary | ICD-10-CM

## 2021-05-20 DIAGNOSIS — R072 Precordial pain: Secondary | ICD-10-CM

## 2021-05-20 DIAGNOSIS — Z8249 Family history of ischemic heart disease and other diseases of the circulatory system: Secondary | ICD-10-CM

## 2021-05-20 MED ORDER — METOPROLOL SUCCINATE ER 50 MG PO TB24: 50.0000 mg | ORAL_TABLET | Freq: Every morning | ORAL | 0 refills | Status: DC

## 2021-05-20 MED ORDER — VALSARTAN 320 MG PO TABS: 320.0000 mg | ORAL_TABLET | Freq: Every day | ORAL | 0 refills | Status: DC

## 2021-05-20 MED ORDER — SPIRONOLACTONE 25 MG PO TABS: 25.0000 mg | ORAL_TABLET | Freq: Every morning | ORAL | 0 refills | Status: DC

## 2021-05-20 MED ORDER — NITROGLYCERIN 0.4 MG SL SUBL: 0.4000 mg | SUBLINGUAL_TABLET | SUBLINGUAL | 0 refills | Status: DC | PRN

## 2021-05-20 NOTE — Progress Notes (Signed)
Date:  05/20/2021   ID:  [name], DOB 27-Dec-1970, MRN [id number]  PCP:  [facility]  Cardiologist:  Rex Kras, DO, [facility] (established care 05/20/2021) Former Cardiology Providers: Binnie Kand, NP  Chief Complaint  Patient presents with   Hypertension    palp   Palpitations   New Patient (Initial Visit)   Hospitalization Follow-up   Chest Pain    HPI  Brandon Hogan is a 51 y.o. Caucasian male who presents to the office with a chief complaint of "chest pain, palpitations, recent ER visit." Patient's past medical history and cardiovascular risk factors include: Hypertension, hyperlipidemia, family history of premature CAD, type 2 diabetes, former smoker, history of excessive alcohol use.   Patient was last seen approximately 2 years ago in December 2020 by Miquel Dunn and now presents today to reestablish care.  He has been having chest pain for the last 1 year, dull, 3 out of 10 in intensity, substernally located.  At times the discomfort will get worse with effort related activities up to 5 out of 10 which he describes as pressure-like feeling.  He used to play softball approximately 5 years ago but quit due to him being more winded with effort related activities.  For reasons unknown he stopped taking all of his medications for at least 1 year or more.  He recently restarted some of the medications back in December 2022.  Given his symptoms of precordial discomfort he was referred to cardiology; however, due to progressive symptoms he had gone to ED on May 11, 2021.  High sensitive troponins were negative x2 he was treated for hypokalemia and asked to follow-up with cardiology outpatient.  Approximately 10 years ago he used to drink 12-14 bottles of beer daily which she did for 4 to 5 years but since then has stopped.  His last alcoholic drink was about a week ago.  Family history of premature CAD.  Both father and brother had myocardial  infarction's at the age of 50.  Father status post four-vessel bypass in brother has also undergone CABG.  Mother passed away at the age of 61 due to myocardial infarction.  Outside labs independently reviewed and noted below for further reference. Office note from PCP reviewed he is referred back to the office for evaluation of chest pain and blood pressure management.  Denies any active chest pain.   Patient works as an Geophysical data processor.  FUNCTIONAL STATUS: No structured exercise program or daily routine.   ALLERGIES: No Known Allergies  MEDICATION LIST PRIOR TO VISIT: Current Meds  Medication Sig   allopurinol (ZYLOPRIM) 100 MG tablet Take 100 mg by mouth 2 (two) times daily.   LORazepam (ATIVAN) 0.5 MG tablet 1 tablet at bedtime as needed   meloxicam (MOBIC) 15 MG tablet Take 1 tablet by mouth daily.   metFORMIN (GLUCOPHAGE-XR) 500 MG 24 hr tablet Take 500 mg by mouth every morning.   metoprolol succinate (TOPROL XL) 50 MG 24 hr tablet Take 1 tablet (50 mg total) by mouth every morning. Take with or immediately following a meal.   Multiple Vitamins-Minerals (MULTI-VITAMIN GUMMIES) CHEW Chew 1 tablet by mouth daily.   nitroGLYCERIN (NITROSTAT) 0.4 MG SL tablet Place 1 tablet (0.4 mg total) under the tongue every 5 (five) minutes as needed for chest pain. If you require more than two tablets five minutes apart go to the nearest ER via EMS.   simvastatin (ZOCOR) 10 MG tablet Take 1 tablet by mouth daily.  spironolactone (ALDACTONE) 25 MG tablet Take 1 tablet (25 mg total) by mouth every morning.   valsartan (DIOVAN) 320 MG tablet Take 1 tablet (320 mg total) by mouth daily at 10 pm.   [DISCONTINUED] lisinopril-hydrochlorothiazide (ZESTORETIC) 10-12.5 MG tablet 1 tablet   [DISCONTINUED] Omega-3 Fatty Acids (FISH OIL) 1000 MG CAPS Take 2 capsules by mouth 2 (two) times daily.     PAST MEDICAL HISTORY: Past Medical History:  Diagnosis Date   Cardiomyopathy ([place])     Diabetes mellitus without complication ([place])    Gallstone    Hyperlipidemia    Hypertension     PAST SURGICAL HISTORY: Past Surgical History:  Procedure Laterality Date   CHOLECYSTECTOMY N/A 09/18/2016   Procedure: LAPAROSCOPIC CHOLECYSTECTOMY;  Surgeon: Stark Klein, MD;  Location: [place];  Service: General;  Laterality: N/A;   CHOLECYSTECTOMY  2018   ERCP N/A 09/18/2016   Procedure: ENDOSCOPIC RETROGRADE CHOLANGIOPANCREATOGRAPHY (ERCP);  Surgeon: Gatha Mayer, MD;  Location: [place];  Service: Endoscopy;  Laterality: N/A;    FAMILY HISTORY: The patient family history includes Gallbladder disease in his father; Heart attack in his mother; Heart attack (age of onset: 31) in his brother and father.  SOCIAL HISTORY:  The patient  reports that he quit smoking about 10 years ago. His smoking use included cigarettes. He has a 50.00 pack-year smoking history. He has never used smokeless tobacco. He reports current alcohol use. He reports that he does not use drugs.  REVIEW OF SYSTEMS: Review of Systems  Cardiovascular:  Positive for chest pain, dyspnea on exertion, leg swelling (intermittently), orthopnea and paroxysmal nocturnal dyspnea. Negative for palpitations and syncope.  Respiratory:  Positive for shortness of breath.    PHYSICAL EXAM: Vitals with BMI 05/20/2021 05/20/2021 05/11/2021  Height - $Remove'6\' 1"'hLPsaGh$  -  Weight - 217 lbs 10 oz -  BMI - 80.99 -  Systolic 833 825 053  Diastolic 99 90 79  Pulse 976 104 88    CONSTITUTIONAL: Well-developed and well-nourished. No acute distress.  SKIN: Skin is warm and dry. No rash noted. No cyanosis. No pallor. No jaundice HEAD: Normocephalic and atraumatic.  EYES: No scleral icterus MOUTH/THROAT: Moist oral membranes.  NECK: No JVD present. No thyromegaly noted. No carotid bruits  LYMPHATIC: No visible cervical adenopathy.  CHEST Normal respiratory effort. No intercostal retractions  LUNGS: Clear to auscultation bilaterally.  No stridor. No  wheezes. No rales.  CARDIOVASCULAR: Regular rate and rhythm, positive S1-S2, no murmurs rubs or gallops appreciated. ABDOMINAL: Soft, nontender, distended, positive bowel sounds in all 4 quadrants, no apparent ascites.  EXTREMITIES: No peripheral edema, warm to touch, 2+ bilateral DP and PT pulses HEMATOLOGIC: No significant bruising NEUROLOGIC: Oriented to person, place, and time. Nonfocal. Normal muscle tone.  PSYCHIATRIC: Normal mood and affect. Normal behavior. Cooperative  CARDIAC DATABASE: EKG: 05/20/2021: NSR, 92bpm, normal axis, without underlying injury pattern.   Echocardiogram: 08/19/2018: Normal LV systolic function with EF 55%. Left ventricle cavity is normal in size. Normal global wall motion. Doppler evidence of grade I (impaired) diastolic dysfunction, normal LAP. Calculated EF 55%. No hemodynamically significant valvular abnormalities. Inadequate TR jet to estimate pulmonary artery systolic pressure. Normal right atrial pressure. Compared to previous echocardiogram on 08/11/2016, LVEF is mildly improved.  Stress Testing: No results found for this or any previous visit from the past 1095 days.   Heart Catheterization: None  LABORATORY DATA: CBC Latest Ref Rng & Units 05/11/2021 09/20/2016 09/19/2016  WBC 4.0 - 10.5 K/uL 11.6(H) 12.2(H) 11.4(H)  Hemoglobin  13.0 - 17.0 g/dL 15.6 14.0 14.6  Hematocrit 39.0 - 52.0 % 43.8 41.9 43.8  Platelets 150 - 400 K/uL 278 232 260    CMP Latest Ref Rng & Units 05/11/2021 06/29/2018 09/20/2016  Glucose 70 - 99 mg/dL 119(H) 145(H) 85  BUN 6 - 20 mg/dL $Remove'14 11 7  'aCFJvai$ Creatinine 0.61 - 1.24 mg/dL 1.21 1.25 1.07  Sodium 135 - 145 mmol/L 137 142 138  Potassium 3.5 - 5.1 mmol/L 2.7(LL) 4.6 3.6  Chloride 98 - 111 mmol/L 104 99 102  CO2 22 - 32 mmol/L $RemoveB'23 24 26  'nLFqWktr$ Calcium 8.9 - 10.3 mg/dL 9.4 10.0 9.1  Total Protein 6.5 - 8.1 g/dL - - 6.5  Total Bilirubin 0.3 - 1.2 mg/dL - - 1.4(H)  Alkaline Phos 38 - 126 U/L - - 150(H)  AST 15 - 41 U/L - - 52(H)   ALT 17 - 63 U/L - - 167(H)    Lipid Panel  No results found for: CHOL, TRIG, HDL, CHOLHDL, VLDL, LDLCALC, LDLDIRECT, LABVLDL  No components found for: NTPROBNP No results for input(s): PROBNP in the last 8760 hours. Recent Labs    05/11/21 1645  TSH 1.650    BMP Recent Labs    05/11/21 1645  NA 137  K 2.7*  CL 104  CO2 23  GLUCOSE 119*  BUN 14  CREATININE 1.21  CALCIUM 9.4  GFRNONAA >60    HEMOGLOBIN A1C Lab Results  Component Value Date   HGBA1C 5.2 09/19/2016   MPG 103 09/19/2016   External Labs: Collected: 03/18/2021 A1c 6.8  Total cholesterol 182, triglycerides 233, HDL 32, non-HDL 150, LDL 103. Hemoglobin 14.7, hematocrit 43.3% Sodium 140, potassium 3.7, chloride 103, bicarb 27, BUN 9, creatinine 1.4. AST 32, ALT 59, alkaline phosphatase 113. eGFR 59  IMPRESSION:    ICD-10-CM   1. Precordial pain  R07.2 EKG 12-Lead    valsartan (DIOVAN) 320 MG tablet    metoprolol succinate (TOPROL XL) 50 MG 24 hr tablet    spironolactone (ALDACTONE) 25 MG tablet    PCV ECHOCARDIOGRAM COMPLETE    CT CARDIAC SCORING ([facility] LOCATIONS ONLY)    nitroGLYCERIN (NITROSTAT) 0.4 MG SL tablet    2. Essential hypertension  I10 EKG 12-Lead    valsartan (DIOVAN) 320 MG tablet    metoprolol succinate (TOPROL XL) 50 MG 24 hr tablet    spironolactone (ALDACTONE) 25 MG tablet    Hemoglobin and hematocrit, blood    Basic metabolic panel    Magnesium    3. Dyslipidemia  E78.5     4. Family history of premature CAD  Z82.49     5. Non-insulin dependent type 2 diabetes mellitus ([place])  E11.9     6. Former smoker  Z87.891        RECOMMENDATIONS: HOLLEY WIRT is a 51 y.o. Caucasian male whose past medical history and cardiac risk factors include: Hypertension, hyperlipidemia, family history of premature CAD, type 2 diabetes, former smoker, history of excessive alcohol use.   Precordial pain Symptoms suggestive of angina pectoris. Would like to reinitiate medical  therapy which he has stopped more than a year ago. Recent ED visit results reviewed.  High sensitive troponin negative x2 Discontinue lisinopril/hydrochlorothiazide. We will restart valsartan 320 mg p.o. daily. Restart Toprol-XL 50 mg p.o. daily. Restart spironolactone 25 mg p.o. daily Blood work in 1 week to evaluate kidney function and electrolytes Echo will be ordered to evaluate for structural heart disease and left ventricular systolic function. Coronary artery calcium  score for further risk stratification.  If the patient has a total coronary calcium score of greater than 400 AU would recommend stress testing as opposed to coronary CTA.  Further recommendations to follow. Sublingual nitroglycerin tablets to use on a as needed basis.  Medication profile discussed.  He is not on any phosphodiesterase 5 inhibitors. Educated on the importance of medication compliance and improving his modifiable cardiovascular risk factors  Essential hypertension Office blood pressures are not well controlled. Likely due to medication noncompliance. Up titration to his original medication doses as discussed above. We will need further monitoring.  Reemphasized importance of a low-salt diet.  Dyslipidemia Had stopped taking his statin therapy until December 2022. Follow-up lipids will be performed by his PCP, according to the patient. Recommend a LDL goal of at least less than 70 mg/dL and if possible less than 55 mg/dL given such a strong family history of premature CAD and other cardiovascular risk factors.  We will follow peripherally.  Family history of premature CAD Reeducated on the importance of medication compliance, improving his modifiable cardiovascular risk factors, and close follow-up.  Non-insulin dependent type 2 diabetes mellitus ([place]) Currently on metformin, ARB Currently managed by primary care provider.  Total time spent: 45 minutes and as part of today's office visit reviewed  office notes provided by the referring physician, outside labs, discussed disease management, independently interpreted EKG findings noted above, medication titration as discussed above, plan to perform an echocardiogram and coronary calcium score for further risk stratification with close follow-up in 2 weeks.  Educated on seeking medical attention sooner by going to the closest ER via EMS if the symptoms increase in intensity, frequency, duration, or has typical chest pain as discussed in the office.  Patient verbalized understanding.  FINAL MEDICATION LIST END OF ENCOUNTER: Meds ordered this encounter  Medications   valsartan (DIOVAN) 320 MG tablet    Sig: Take 1 tablet (320 mg total) by mouth daily at 10 pm.    Dispense:  30 tablet    Refill:  0   metoprolol succinate (TOPROL XL) 50 MG 24 hr tablet    Sig: Take 1 tablet (50 mg total) by mouth every morning. Take with or immediately following a meal.    Dispense:  30 tablet    Refill:  0   spironolactone (ALDACTONE) 25 MG tablet    Sig: Take 1 tablet (25 mg total) by mouth every morning.    Dispense:  30 tablet    Refill:  0   nitroGLYCERIN (NITROSTAT) 0.4 MG SL tablet    Sig: Place 1 tablet (0.4 mg total) under the tongue every 5 (five) minutes as needed for chest pain. If you require more than two tablets five minutes apart go to the nearest ER via EMS.    Dispense:  30 tablet    Refill:  0    Medications Discontinued During This Encounter  Medication Reason   metFORMIN (GLUCOPHAGE-XR) 500 MG 24 hr tablet    allopurinol (ZYLOPRIM) 100 MG tablet    simvastatin (ZOCOR) 20 MG tablet    metoprolol succinate (TOPROL-XL) 50 MG 24 hr tablet    Omega-3 Fatty Acids (FISH OIL) 1000 MG CAPS    valsartan-hydrochlorothiazide (DIOVAN-HCT) 320-25 MG tablet    spironolactone (ALDACTONE) 25 MG tablet    potassium chloride SA (KLOR-CON M) 20 MEQ tablet    JANUVIA 50 MG tablet    ASPIRIN 81 PO    fenofibrate (TRICOR) 48 MG tablet  glimepiride (AMARYL) 2 MG tablet    ibuprofen (ADVIL,MOTRIN) 400 MG tablet    lisinopril-hydrochlorothiazide (ZESTORETIC) 10-12.5 MG tablet Change in therapy     Current Outpatient Medications:    allopurinol (ZYLOPRIM) 100 MG tablet, Take 100 mg by mouth 2 (two) times daily., Disp: , Rfl:    LORazepam (ATIVAN) 0.5 MG tablet, 1 tablet at bedtime as needed, Disp: , Rfl:    meloxicam (MOBIC) 15 MG tablet, Take 1 tablet by mouth daily., Disp: , Rfl:    metFORMIN (GLUCOPHAGE-XR) 500 MG 24 hr tablet, Take 500 mg by mouth every morning., Disp: , Rfl: 4   metoprolol succinate (TOPROL XL) 50 MG 24 hr tablet, Take 1 tablet (50 mg total) by mouth every morning. Take with or immediately following a meal., Disp: 30 tablet, Rfl: 0   Multiple Vitamins-Minerals (MULTI-VITAMIN GUMMIES) CHEW, Chew 1 tablet by mouth daily., Disp: , Rfl:    nitroGLYCERIN (NITROSTAT) 0.4 MG SL tablet, Place 1 tablet (0.4 mg total) under the tongue every 5 (five) minutes as needed for chest pain. If you require more than two tablets five minutes apart go to the nearest ER via EMS., Disp: 30 tablet, Rfl: 0   simvastatin (ZOCOR) 10 MG tablet, Take 1 tablet by mouth daily., Disp: , Rfl:    spironolactone (ALDACTONE) 25 MG tablet, Take 1 tablet (25 mg total) by mouth every morning., Disp: 30 tablet, Rfl: 0   valsartan (DIOVAN) 320 MG tablet, Take 1 tablet (320 mg total) by mouth daily at 10 pm., Disp: 30 tablet, Rfl: 0  Orders Placed This Encounter  Procedures   CT CARDIAC SCORING ([facility] LOCATIONS ONLY)   Hemoglobin and hematocrit, blood   Basic metabolic panel   Magnesium   EKG 12-Lead   PCV ECHOCARDIOGRAM COMPLETE    There are no Patient Instructions on file for this visit.   --Continue cardiac medications as reconciled in final medication list. --Return in about 2 weeks (around 06/03/2021) for Chest pain, Review test results. Or sooner if needed. --Continue follow-up with your primary care physician regarding the management of  your other chronic comorbid conditions.  Patient's questions and concerns were addressed to his satisfaction. He voices understanding of the instructions provided during this encounter.   This note was created using a voice recognition software as a result there may be grammatical errors inadvertently enclosed that do not reflect the nature of this encounter. Every attempt is made to correct such errors.  Rex Kras, [place], [facility]  Pager: [phone] Office: [phone]

## 2021-05-22 ENCOUNTER — Other Ambulatory Visit: Payer: Self-pay

## 2021-05-22 ENCOUNTER — Ambulatory Visit: Payer: BC Managed Care – PPO

## 2021-05-22 DIAGNOSIS — R072 Precordial pain: Secondary | ICD-10-CM

## 2021-05-23 ENCOUNTER — Other Ambulatory Visit: Payer: BC Managed Care – PPO

## 2021-05-28 ENCOUNTER — Ambulatory Visit
Admission: RE | Admit: 2021-05-28 | Discharge: 2021-05-28 | Disposition: A | Discharge status: Home / Self Care | Payer: No Typology Code available for payment source | Source: Ambulatory Visit | Attending: Cardiology | Admitting: Cardiology

## 2021-05-28 DIAGNOSIS — R072 Precordial pain: Secondary | ICD-10-CM

## 2021-05-29 NOTE — Progress Notes (Signed)
Called and spoke with patient regarding his CT cardiac scoringi test. Patient will be here on the 28th.

## 2021-06-02 ENCOUNTER — Other Ambulatory Visit: Payer: Self-pay

## 2021-06-02 DIAGNOSIS — I10 Essential (primary) hypertension: Secondary | ICD-10-CM

## 2021-06-03 ENCOUNTER — Ambulatory Visit: Payer: BC Managed Care – PPO | Admitting: Cardiology

## 2021-06-03 ENCOUNTER — Other Ambulatory Visit: Payer: Self-pay

## 2021-06-03 ENCOUNTER — Encounter: Payer: Self-pay | Admitting: Cardiology

## 2021-06-03 VITALS — BP 150/88 | HR 87 | Temp 97.7°F | Resp 16 | Ht 73.0 in | Wt 219.0 lb

## 2021-06-03 DIAGNOSIS — I5041 Acute combined systolic (congestive) and diastolic (congestive) heart failure: Secondary | ICD-10-CM

## 2021-06-03 DIAGNOSIS — Z8249 Family history of ischemic heart disease and other diseases of the circulatory system: Secondary | ICD-10-CM

## 2021-06-03 DIAGNOSIS — Z87891 Personal history of nicotine dependence: Secondary | ICD-10-CM

## 2021-06-03 DIAGNOSIS — R072 Precordial pain: Secondary | ICD-10-CM

## 2021-06-03 DIAGNOSIS — E119 Type 2 diabetes mellitus without complications: Secondary | ICD-10-CM

## 2021-06-03 DIAGNOSIS — R931 Abnormal findings on diagnostic imaging of heart and coronary circulation: Secondary | ICD-10-CM

## 2021-06-03 DIAGNOSIS — E785 Hyperlipidemia, unspecified: Secondary | ICD-10-CM

## 2021-06-03 DIAGNOSIS — I10 Essential (primary) hypertension: Secondary | ICD-10-CM

## 2021-06-03 MED ORDER — METOPROLOL SUCCINATE ER 100 MG PO TB24: 100.0000 mg | ORAL_TABLET | ORAL | 0 refills | Status: DC

## 2021-06-03 MED ORDER — ASPIRIN EC 81 MG PO TBEC: 81.0000 mg | DELAYED_RELEASE_TABLET | Freq: Every day | ORAL | 3 refills | Status: DC

## 2021-06-03 NOTE — Progress Notes (Signed)
Date:  06/03/2021   ID:  Brandon Hogan, DOB October 04, 1970, MRN [id number]  PCP:  [facility]  Cardiologist:  Rex Kras, DO, [facility] (established care 05/20/2021) Former Cardiology Providers: Binnie Kand, NP  Date: 06/03/21 Last Office Visit: 05/20/2021  Chief Complaint  Patient presents with   Chest Pain   Results   Follow-up   HPI  Brandon Hogan is a 51 y.o. Caucasian male who presents to the office with a chief complaint of "reevaluation of chest pain and discuss test results." Patient's past medical history and cardiovascular risk factors include: Hypertension, hyperlipidemia, family history of premature CAD, type 2 diabetes, former smoker, history of excessive alcohol use.   Patient recently reestablished care on 05/20/2021 given his symptoms of chest pain.  His symptoms are very concerning for angina and therefore at last visit was recommended to undergo echocardiogram and coronary calcium score.  In addition, he had stopped taking all of his medications for at least 1 year or more and therefore at the last office visit the shared decision was to restart medical therapy and to reevaluate his symptoms of chest pain.  He is already been to the ED in February 2023 for evaluation of chest pain and was sent home as his high sensitive troponins were negative x2.  He continues to have precordial discomfort, intermittent, intensity 3 out of 10, brought on by over exertional activities or stressful situations, better with resting and relaxing.  His overall intensity, frequency, and/or duration has not changed significantly over the last 1 year and also since last visit.  He has not been taking any sublingual nitroglycerin tablets.  And after restarting his home medications his shortness of breath and orthopnea have slightly improved but not resolved.  Other cardiovascular risk factors include family history of premature CAD. Both father and brother had myocardial  infarction's at the age of 54.  Father status post four-vessel bypass in brother has also undergone CABG.  Mother passed away at the age of 56 due to myocardial infarction.  In approximately 10 years ago he used to drink 12-14 bottles of beer daily for 4 to 5 years. Patient works as an Geophysical data processor.  FUNCTIONAL STATUS: No structured exercise program or daily routine.   ALLERGIES: No Known Allergies  MEDICATION LIST PRIOR TO VISIT: Current Meds  Medication Sig   allopurinol (ZYLOPRIM) 100 MG tablet Take 100 mg by mouth 2 (two) times daily.   LORazepam (ATIVAN) 0.5 MG tablet 1 tablet at bedtime as needed   meloxicam (MOBIC) 15 MG tablet Take 1 tablet by mouth daily.   metFORMIN (GLUCOPHAGE-XR) 500 MG 24 hr tablet Take 500 mg by mouth every morning.   Multiple Vitamins-Minerals (MULTI-VITAMIN GUMMIES) CHEW Chew 1 tablet by mouth daily.   nitroGLYCERIN (NITROSTAT) 0.4 MG SL tablet Place 1 tablet (0.4 mg total) under the tongue every 5 (five) minutes as needed for chest pain. If you require more than two tablets five minutes apart go to the nearest ER via EMS.   simvastatin (ZOCOR) 10 MG tablet Take 1 tablet by mouth daily.   spironolactone (ALDACTONE) 25 MG tablet TAKE 1 TABLET(25 MG) BY MOUTH EVERY MORNING   valsartan (DIOVAN) 320 MG tablet TAKE 1 TABLET(320 MG) BY MOUTH DAILY AT 10 PM   [DISCONTINUED] metoprolol succinate (TOPROL-XL) 50 MG 24 hr tablet TAKE 1 TABLET(50 MG) BY MOUTH EVERY MORNING WITH OR IMMEDIATELY FOLLOWING A MEAL     PAST MEDICAL HISTORY: Past Medical History:  Diagnosis  Date   Cardiomyopathy ([place])    Diabetes mellitus without complication ([place])    Gallstone    Hyperlipidemia    Hypertension     PAST SURGICAL HISTORY: Past Surgical History:  Procedure Laterality Date   CHOLECYSTECTOMY N/A 09/18/2016   Procedure: LAPAROSCOPIC CHOLECYSTECTOMY;  Surgeon: Stark Klein, MD;  Location: [place];  Service: General;  Laterality: N/A;    CHOLECYSTECTOMY  2018   ERCP N/A 09/18/2016   Procedure: ENDOSCOPIC RETROGRADE CHOLANGIOPANCREATOGRAPHY (ERCP);  Surgeon: Gatha Mayer, MD;  Location: [place];  Service: Endoscopy;  Laterality: N/A;    FAMILY HISTORY: The patient family history includes Gallbladder disease in his father; Heart attack in his mother; Heart attack (age of onset: 25) in his brother and father.  SOCIAL HISTORY:  The patient  reports that he quit smoking about 10 years ago. His smoking use included cigarettes. He has a 50.00 pack-year smoking history. He has never used smokeless tobacco. He reports current alcohol use. He reports that he does not use drugs.  REVIEW OF SYSTEMS: Review of Systems  Cardiovascular:  Positive for chest pain, dyspnea on exertion, leg swelling (intermittently) and paroxysmal nocturnal dyspnea. Negative for orthopnea, palpitations and syncope.  Respiratory:  Positive for shortness of breath.    PHYSICAL EXAM: Vitals with BMI 06/03/2021 05/20/2021 05/20/2021  Height $Remov'6\' 1"'UWiooV$  - $'6\' 1"'e$   Weight 219 lbs - 217 lbs 10 oz  BMI 56.3 - 87.56  Systolic 433 295 188  Diastolic 88 99 90  Pulse 87 100 104    CONSTITUTIONAL: Well-developed and well-nourished. No acute distress.  SKIN: Skin is warm and dry. No rash noted. No cyanosis. No pallor. No jaundice HEAD: Normocephalic and atraumatic.  EYES: No scleral icterus MOUTH/THROAT: Moist oral membranes.  NECK: No JVD present. No thyromegaly noted. No carotid bruits  LYMPHATIC: No visible cervical adenopathy.  CHEST Normal respiratory effort. No intercostal retractions  LUNGS: Clear to auscultation bilaterally.  No stridor. No wheezes. No rales.  CARDIOVASCULAR: Regular rate and rhythm, positive S1-S2, no murmurs rubs or gallops appreciated. ABDOMINAL: Soft, nontender, distended, positive bowel sounds in all 4 quadrants, no apparent ascites.  EXTREMITIES: No peripheral edema, warm to touch, 2+ bilateral DP and PT pulses HEMATOLOGIC: No significant  bruising NEUROLOGIC: Oriented to person, place, and time. Nonfocal. Normal muscle tone.  PSYCHIATRIC: Normal mood and affect. Normal behavior. Cooperative  CARDIAC DATABASE: EKG: 05/20/2021: NSR, 92bpm, normal axis, without underlying injury pattern.   Echocardiogram: 05/22/2021:  Left ventricle cavity is normal in size. Normal left ventricular wall thickness. Hypokinetic global wall motion. Normal diastolic filling pattern. Mildly depressed LV systolic function with visual EF 45-50%. Left atrial cavity is mildly dilated. Compared to 08/19/2018, LVEF previously reported at 50 to 55%, however on 08/11/2016, EF was reported to be 40 to 45%. Overall no significant change.  Stress Testing: No results found for this or any previous visit from the past 1095 days.   Heart Catheterization: None  CT Cardiac Scoring: 05/28/2021 Left Main: 0 LAD: 7.8 LCx: 4.7 RCA: 3.5 Total CAC 16 AU, 75th percentile for patient's age, sex, and race. Noncardiac findings:   LABORATORY DATA: CBC Latest Ref Rng & Units 05/11/2021 09/20/2016 09/19/2016  WBC 4.0 - 10.5 K/uL 11.6(H) 12.2(H) 11.4(H)  Hemoglobin 13.0 - 17.0 g/dL 15.6 14.0 14.6  Hematocrit 39.0 - 52.0 % 43.8 41.9 43.8  Platelets 150 - 400 K/uL 278 232 260    CMP Latest Ref Rng & Units 05/11/2021 06/29/2018 09/20/2016  Glucose 70 - 99 mg/dL  119(H) 145(H) 85  BUN 6 - 20 mg/dL $Remove'14 11 7  'zVatzQr$ Creatinine 0.61 - 1.24 mg/dL 1.21 1.25 1.07  Sodium 135 - 145 mmol/L 137 142 138  Potassium 3.5 - 5.1 mmol/L 2.7(LL) 4.6 3.6  Chloride 98 - 111 mmol/L 104 99 102  CO2 22 - 32 mmol/L $RemoveB'23 24 26  'pAdOOPaO$ Calcium 8.9 - 10.3 mg/dL 9.4 10.0 9.1  Total Protein 6.5 - 8.1 g/dL - - 6.5  Total Bilirubin 0.3 - 1.2 mg/dL - - 1.4(H)  Alkaline Phos 38 - 126 U/L - - 150(H)  AST 15 - 41 U/L - - 52(H)  ALT 17 - 63 U/L - - 167(H)    Lipid Panel  No results found for: CHOL, TRIG, HDL, CHOLHDL, VLDL, LDLCALC, LDLDIRECT, LABVLDL  No components found for: NTPROBNP No results for input(s):  PROBNP in the last 8760 hours. Recent Labs    05/11/21 1645  TSH 1.650    BMP Recent Labs    05/11/21 1645  NA 137  K 2.7*  CL 104  CO2 23  GLUCOSE 119*  BUN 14  CREATININE 1.21  CALCIUM 9.4  GFRNONAA >60    HEMOGLOBIN A1C Lab Results  Component Value Date   HGBA1C 5.2 09/19/2016   MPG 103 09/19/2016   External Labs: Collected: 03/18/2021 A1c 6.8  Total cholesterol 182, triglycerides 233, HDL 32, non-HDL 150, LDL 103. Hemoglobin 14.7, hematocrit 43.3% Sodium 140, potassium 3.7, chloride 103, bicarb 27, BUN 9, creatinine 1.4. AST 32, ALT 59, alkaline phosphatase 113. eGFR 59  IMPRESSION:    ICD-10-CM   1. Precordial pain  R07.2 CT CORONARY MORPH W/CTA COR W/SCORE W/CA W/CM &/OR WO/CM    metoprolol succinate (TOPROL-XL) 100 MG 24 hr tablet    2. Acute combined systolic and diastolic heart failure (HCC)  I50.41 Pro b natriuretic peptide (BNP)    3. Essential hypertension  I10 metoprolol succinate (TOPROL-XL) 100 MG 24 hr tablet    4. Dyslipidemia  E78.5 Lipid Panel With LDL/HDL Ratio    Direct LDL    5. Family history of premature CAD  Z82.49     6. Non-insulin dependent type 2 diabetes mellitus (HCC)  E11.9 Lipid Panel With LDL/HDL Ratio    Direct LDL    7. Former smoker  Z87.891        RECOMMENDATIONS: DURELL LOFASO is a 51 y.o. Caucasian male whose past medical history and cardiac risk factors include: Hypertension, hyperlipidemia, family history of premature CAD, type 2 diabetes, former smoker, history of excessive alcohol use.   Precordial pain Suggestive of angina pectoris. However, the intensity, frequency, duration has not changed significantly since last office encounter. No use of sublingual nitroglycerin tablets. Echo: LVEF 45-50%, personally reviewed the images consistent with grade 1 diastolic dysfunction, mild valvular heart disease. Coronary calcium score: 16 AU, 75th percentile. After restarting his home medications he was  supposed to have labs done a week later.  Which are still pending. Shared decision was to proceed with coronary CTA to evaluate for obstructive CAD.  Acute combined systolic and diastolic heart failure (HCC) Stage B, NYHA class II/III Echocardiogram 05/2021: LVEF 99-83%, grade 1 diastolic impairment. Currently on valsartan, Toprol-XL, spironolactone. Once the patient has completed his coronary CTA and renal function stable would like to transition him from valsartan to Entresto start Farxiga 10 mg p.o. daily. Check NT proBNP. Further recommendations to follow.  Essential hypertension Office blood pressures are not well controlled. For now increase metoprolol succinate to 100 mg p.o.  daily to help improve his ventricular rate given his upcoming coronary CTA.  Dyslipidemia We will check fasting lipid profile. Continue simvastatin. Start aspirin 81 mg p.o. daily.  Family history of premature CAD Reeducated on the importance of medication compliance, improving his modifiable cardiovascular risk factors, and close follow-up.  Non-insulin dependent type 2 diabetes mellitus ([place]) Currently on metformin, ARB Currently managed by primary care provider.  FINAL MEDICATION LIST END OF ENCOUNTER: Meds ordered this encounter  Medications   metoprolol succinate (TOPROL-XL) 100 MG 24 hr tablet    Sig: Take 1 tablet (100 mg total) by mouth every morning. Take with or immediately following a meal.    Dispense:  90 tablet    Refill:  0    **Patient requests 90 days supply**    Medications Discontinued During This Encounter  Medication Reason   metoprolol succinate (TOPROL-XL) 50 MG 24 hr tablet      Current Outpatient Medications:    allopurinol (ZYLOPRIM) 100 MG tablet, Take 100 mg by mouth 2 (two) times daily., Disp: , Rfl:    LORazepam (ATIVAN) 0.5 MG tablet, 1 tablet at bedtime as needed, Disp: , Rfl:    meloxicam (MOBIC) 15 MG tablet, Take 1 tablet by mouth daily., Disp: , Rfl:     metFORMIN (GLUCOPHAGE-XR) 500 MG 24 hr tablet, Take 500 mg by mouth every morning., Disp: , Rfl: 4   Multiple Vitamins-Minerals (MULTI-VITAMIN GUMMIES) CHEW, Chew 1 tablet by mouth daily., Disp: , Rfl:    nitroGLYCERIN (NITROSTAT) 0.4 MG SL tablet, Place 1 tablet (0.4 mg total) under the tongue every 5 (five) minutes as needed for chest pain. If you require more than two tablets five minutes apart go to the nearest ER via EMS., Disp: 30 tablet, Rfl: 0   simvastatin (ZOCOR) 10 MG tablet, Take 1 tablet by mouth daily., Disp: , Rfl:    spironolactone (ALDACTONE) 25 MG tablet, TAKE 1 TABLET(25 MG) BY MOUTH EVERY MORNING, Disp: 90 tablet, Rfl: 0   valsartan (DIOVAN) 320 MG tablet, TAKE 1 TABLET(320 MG) BY MOUTH DAILY AT 10 PM, Disp: 90 tablet, Rfl: 0   metoprolol succinate (TOPROL-XL) 100 MG 24 hr tablet, Take 1 tablet (100 mg total) by mouth every morning. Take with or immediately following a meal., Disp: 90 tablet, Rfl: 0  Orders Placed This Encounter  Procedures   CT CORONARY MORPH W/CTA COR W/SCORE W/CA W/CM &/OR WO/CM   Lipid Panel With LDL/HDL Ratio   Direct LDL   Pro b natriuretic peptide (BNP)    There are no Patient Instructions on file for this visit.   --Continue cardiac medications as reconciled in final medication list. --Return in about 4 weeks (around 07/01/2021) for Follow up, heart failure management., Review test results. Or sooner if needed. --Continue follow-up with your primary care physician regarding the management of your other chronic comorbid conditions.  Patient's questions and concerns were addressed to his satisfaction. He voices understanding of the instructions provided during this encounter.   This note was created using a voice recognition software as a result there may be grammatical errors inadvertently enclosed that do not reflect the nature of this encounter. Every attempt is made to correct such errors.  Rex Kras, [place], [facility]  Pager: [phone] Office:  [phone]

## 2021-06-05 LAB — LIPID PANEL WITH LDL/HDL RATIO
Cholesterol, Total: 157 mg/dL (ref 100–199)
HDL: 35 mg/dL — ABNORMAL LOW (ref >39)
LDL Chol Calc (NIH): 81 mg/dL (ref 0–99)
LDL/HDL Ratio: 2.3 ratio (ref 0.0–3.6)
Triglycerides: 245 mg/dL — ABNORMAL HIGH (ref 0–149)
VLDL Cholesterol Cal: 41 mg/dL — ABNORMAL HIGH (ref 5–40)

## 2021-06-05 LAB — BASIC METABOLIC PANEL
BUN/Creatinine Ratio: 7 — ABNORMAL LOW (ref 9–20)
BUN: 10 mg/dL (ref 6–24)
CO2: 24 mmol/L (ref 20–29)
Calcium: 9.7 mg/dL (ref 8.7–10.2)
Chloride: 103 mmol/L (ref 96–106)
Creatinine, Ser: 1.35 mg/dL — ABNORMAL HIGH (ref 0.76–1.27)
Glucose: 106 mg/dL — ABNORMAL HIGH (ref 70–99)
Potassium: 4.2 mmol/L (ref 3.5–5.2)
Sodium: 141 mmol/L (ref 134–144)
eGFR: 64 mL/min/{1.73_m2} (ref >59)

## 2021-06-05 LAB — MAGNESIUM: Magnesium: 2 mg/dL (ref 1.6–2.3)

## 2021-06-05 LAB — PRO B NATRIURETIC PEPTIDE: NT-Pro BNP: 54 pg/mL (ref 0–121)

## 2021-06-05 LAB — HEMOGLOBIN AND HEMATOCRIT, BLOOD
Hematocrit: 46 % (ref 37.5–51.0)
Hemoglobin: 16 g/dL (ref 13.0–17.7)

## 2021-06-05 LAB — LDL CHOLESTEROL, DIRECT: LDL Direct: 87 mg/dL (ref 0–99)

## 2021-06-05 NOTE — Progress Notes (Signed)
I spoke to patient he is aware of results. Patient was told to increase water intake and to hold off mobic patient stated he has not taken any since the last ov patient is not taking anymore and I will make Devonna aware to f/u with him

## 2021-06-06 ENCOUNTER — Ambulatory Visit: Payer: BC Managed Care – PPO | Admitting: Cardiology

## 2021-06-10 ENCOUNTER — Telehealth: Payer: Self-pay

## 2021-06-10 NOTE — Telephone Encounter (Signed)
Pt called had stated that Saturday night he started to feel dizzy and felt as though the room was spinning. After the dizziness stopped he got a headache. This has been continuing since that night. Tylenol has been helping a little but it goes away and comes back. Pt has been having slight blurred vision when reading recently as well. He is unsure if this is due to recent medication changes. Pt will call us back later with BP readings. ?

## 2021-06-11 DIAGNOSIS — R072 Precordial pain: Secondary | ICD-10-CM

## 2021-06-11 DIAGNOSIS — I10 Essential (primary) hypertension: Secondary | ICD-10-CM

## 2021-06-11 MED ORDER — VALSARTAN 320 MG PO TABS: 160.0000 mg | ORAL_TABLET | Freq: Every day | ORAL | 0 refills | Status: DC

## 2021-06-11 NOTE — Telephone Encounter (Signed)
Call and find out what his BP has been running? ? ?Reduce Valsartan - take 0.5 tab qday. Update MAR.  ? ?Dr. [name]

## 2021-06-11 NOTE — Telephone Encounter (Signed)
Called and spoke to pt, his BP yesterday was 130/89. He will reduce his valsartan, updated in the [facility].

## 2021-06-17 ENCOUNTER — Telehealth (HOSPITAL_COMMUNITY): Payer: Self-pay | Admitting: *Deleted

## 2021-06-17 NOTE — Telephone Encounter (Signed)
Patient returning call regarding upcoming cardiac imaging study; pt verbalizes understanding of appt date/time, parking situation and where to check in, pre-test NPO status and medications ordered, and verified current allergies; name and call back number provided for further questions should they arise ? ?Gordy Clement RN Navigator Cardiac Imaging ?[facility] ?[phone] office ?[phone] cell ? ?Patient to take his AM metoprolol succinate dose per his normal and his PM metoprolol succinate dose two hours prior to his cardiac CT scan.  He is aware to arrive at 1:30pm for his 2pm scan. ?

## 2021-06-17 NOTE — Telephone Encounter (Signed)
Attempted to call patient regarding upcoming cardiac CT appointment. °Left message on voicemail with name and callback number ° °Azya Barbero RN Navigator Cardiac Imaging °[facility] °[phone] Office °[phone] Cell ° °

## 2021-06-18 ENCOUNTER — Other Ambulatory Visit: Payer: Self-pay

## 2021-06-18 ENCOUNTER — Encounter (HOSPITAL_COMMUNITY): Payer: Self-pay

## 2021-06-18 ENCOUNTER — Ambulatory Visit (HOSPITAL_COMMUNITY)
Admission: RE | Admit: 2021-06-18 | Discharge: 2021-06-18 | Disposition: A | Discharge status: Home / Self Care | Payer: BC Managed Care – PPO | Source: Ambulatory Visit | Attending: Internal Medicine | Admitting: Internal Medicine

## 2021-06-18 DIAGNOSIS — R072 Precordial pain: Secondary | ICD-10-CM | POA: Diagnosis present

## 2021-06-18 MED ORDER — IOHEXOL 350 MG/ML SOLN
95.0000 mL | Freq: Once | INTRAVENOUS | Status: AC | PRN
  Administered 2021-06-18: 95 mL via INTRAVENOUS

## 2021-06-18 MED ORDER — NITROGLYCERIN 0.4 MG SL SUBL: 0.8000 mg | SUBLINGUAL_TABLET | Freq: Once | SUBLINGUAL | Status: DC

## 2021-06-18 MED ORDER — DILTIAZEM HCL 25 MG/5ML IV SOLN: 10.0000 mg | Freq: Once | INTRAVENOUS | Status: DC

## 2021-06-18 MED ORDER — METOPROLOL TARTRATE 5 MG/5ML IV SOLN
INTRAVENOUS | Status: AC
  Administered 2021-06-18: 10 mg
  Filled 2021-06-18: qty 10

## 2021-06-18 MED ORDER — NITROGLYCERIN 0.4 MG SL SUBL
SUBLINGUAL_TABLET | SUBLINGUAL | Status: AC
  Filled 2021-06-18: qty 2

## 2021-06-18 MED ORDER — DILTIAZEM HCL 25 MG/5ML IV SOLN
INTRAVENOUS | Status: AC
  Filled 2021-06-18: qty 5

## 2021-06-22 ENCOUNTER — Other Ambulatory Visit: Payer: Self-pay | Admitting: Cardiology

## 2021-06-22 DIAGNOSIS — R072 Precordial pain: Secondary | ICD-10-CM | POA: Insufficient documentation

## 2021-06-22 DIAGNOSIS — R931 Abnormal findings on diagnostic imaging of heart and coronary circulation: Secondary | ICD-10-CM

## 2021-06-30 ENCOUNTER — Other Ambulatory Visit: Payer: Self-pay

## 2021-06-30 ENCOUNTER — Encounter: Payer: Self-pay | Admitting: Cardiology

## 2021-06-30 ENCOUNTER — Ambulatory Visit: Payer: BC Managed Care – PPO | Admitting: Cardiology

## 2021-06-30 VITALS — BP 128/80 | HR 67 | Temp 97.7°F | Resp 16 | Ht 73.0 in | Wt 220.0 lb

## 2021-06-30 DIAGNOSIS — I5042 Chronic combined systolic (congestive) and diastolic (congestive) heart failure: Secondary | ICD-10-CM

## 2021-06-30 DIAGNOSIS — R072 Precordial pain: Secondary | ICD-10-CM

## 2021-06-30 DIAGNOSIS — I2510 Atherosclerotic heart disease of native coronary artery without angina pectoris: Secondary | ICD-10-CM

## 2021-06-30 DIAGNOSIS — E785 Hyperlipidemia, unspecified: Secondary | ICD-10-CM

## 2021-06-30 DIAGNOSIS — Z8249 Family history of ischemic heart disease and other diseases of the circulatory system: Secondary | ICD-10-CM

## 2021-06-30 DIAGNOSIS — I10 Essential (primary) hypertension: Secondary | ICD-10-CM

## 2021-06-30 DIAGNOSIS — E119 Type 2 diabetes mellitus without complications: Secondary | ICD-10-CM

## 2021-06-30 DIAGNOSIS — Z87891 Personal history of nicotine dependence: Secondary | ICD-10-CM

## 2021-06-30 DIAGNOSIS — R931 Abnormal findings on diagnostic imaging of heart and coronary circulation: Secondary | ICD-10-CM

## 2021-06-30 MED ORDER — ENTRESTO 49-51 MG PO TABS: 1.0000 | ORAL_TABLET | Freq: Two times a day (BID) | ORAL | 0 refills | Status: DC

## 2021-06-30 MED ORDER — ATORVASTATIN CALCIUM 40 MG PO TABS: 40.0000 mg | ORAL_TABLET | Freq: Every day | ORAL | 0 refills | Status: DC

## 2021-06-30 NOTE — Progress Notes (Signed)
? ?ID:  Brandon Hogan, DOB [birth year], MRN [id number] ? ?PCP[facility]  [facility]  ?Cardiologist:  [name], DO, [facility] (established care 05/20/2021) ?Former Cardiology Providers: Donnelly Stager, NP ? ?Date: 06/30/21 ?Last Office Visit: 06/03/2021 ? ?Chief Complaint  ?Patient presents with  ? Follow-up  ?  Reevaluation of chest pain.   ?Review coronary CTA results.  ? ?HPI  ?Brandon Hogan is a 51 y.o. Caucasian male whose past medical history and cardiovascular risk factors include: Non-obstructive CAD per CCTA, mild CAC (32.2AU, 81st percentile), Hypertension, hyperlipidemia, family history of premature CAD, type 2 diabetes, former smoker, history of excessive alcohol use.  ? ?Patient established care with the practice in February 2023 for evaluation of chest pain and this was after stopping taking all of his medications for at least 1 year or more.  In addition to ordering diagnostic work-up to evaluate for CAD he was restarted on his home medications and up titration of antianginal therapies as well.  Since last office visit patient did undergo coronary CTA results reviewed with him in great detail and noted below for further reference.  Of note, no hemodynamically significant stenosis per CT FFR; however, indeterminate lesion/finding may be present in the mid-distal LCx distribution.  Clinically patient's dyspnea on exertion has improved significantly since last office visit.  In the interim patient states that he went hiking and was able to ambulate longer distances without having effort related dyspnea.  He denies orthopnea, PND, lower extremity swelling.  With regards to precordial chest pain the symptoms are also improving but still present with over exertional activities.  Overall his chest pain has improved/reduced in intensity, frequency, and duration.  No use of sublingual nitroglycerin tablets since last office visit. ? ?Other cardiovascular risk factors include family history of  premature CAD. Both father and brother had myocardial infarction's at the age of 44.  Father status post four-vessel bypass in brother has also undergone CABG.  Mother passed away at the age of 78 due to myocardial infarction.  About 10 years ago he used to drink 12-14 bottles of beer daily for 4 to 5 years. Patient works as an Designer, multimedia. ? ?FUNCTIONAL STATUS: ?No structured exercise program or daily routine.  ? ?ALLERGIES: ?No Known Allergies ? ?MEDICATION LIST PRIOR TO VISIT: ?Current Meds  ?Medication Sig  ? allopurinol (ZYLOPRIM) 100 MG tablet Take 100 mg by mouth 2 (two) times daily.  ? atorvastatin (LIPITOR) 40 MG tablet Take 1 tablet (40 mg total) by mouth daily.  ? metFORMIN (GLUCOPHAGE-XR) 500 MG 24 hr tablet Take 500 mg by mouth every morning.  ? metoprolol succinate (TOPROL-XL) 100 MG 24 hr tablet Take 1 tablet (100 mg total) by mouth every morning. Take with or immediately following a meal.  ? Multiple Vitamins-Minerals (MULTI-VITAMIN GUMMIES) CHEW Chew 1 tablet by mouth daily.  ? nitroGLYCERIN (NITROSTAT) 0.4 MG SL tablet Place 1 tablet (0.4 mg total) under the tongue every 5 (five) minutes as needed for chest pain. If you require more than two tablets five minutes apart go to the nearest ER via EMS.  ? sacubitril-valsartan (ENTRESTO) 49-51 MG Take 1 tablet by mouth 2 (two) times daily.  ? spironolactone (ALDACTONE) 25 MG tablet TAKE 1 TABLET(25 MG) BY MOUTH EVERY MORNING  ? VASCEPA 1 g capsule Take 2 capsules (2 g total) by mouth 2 (two) times daily.  ? [DISCONTINUED] simvastatin (ZOCOR) 10 MG tablet Take 1 tablet by mouth daily.  ? [DISCONTINUED] valsartan (DIOVAN) 320  MG tablet Take 0.5 tablets (160 mg total) by mouth daily.  ?  ? ?PAST MEDICAL HISTORY: ?Past Medical History:  ?Diagnosis Date  ? Cardiomyopathy (HCC)   ? Diabetes mellitus without complication (HCC)   ? Gallstone   ? Hyperlipidemia   ? Hypertension   ? ? ?PAST SURGICAL HISTORY: ?Past Surgical History:   ?Procedure Laterality Date  ? CHOLECYSTECTOMY N/A 09/18/2016  ? Procedure: LAPAROSCOPIC CHOLECYSTECTOMY;  Surgeon: Almond Lint, MD;  Location: [facility];  Service: General;  Laterality: N/A;  ? CHOLECYSTECTOMY  2018  ? ERCP N/A 09/18/2016  ? Procedure: ENDOSCOPIC RETROGRADE CHOLANGIOPANCREATOGRAPHY (ERCP);  Surgeon: Iva Boop, MD;  [facility];  Service: Endoscopy;  Laterality: N/A;  ? ? ?FAMILY HISTORY: ?The patient family history includes Gallbladder disease in his father; Heart attack in his mother; Heart attack (age of onset: 85) in his brother and father. ? ?SOCIAL HISTORY:  ?The patient  reports that he quit smoking about 10 years ago. His smoking use included cigarettes. He has a 50.00 pack-year smoking history. He has never used smokeless tobacco. He reports current alcohol use. He reports that he does not use drugs. ? ?REVIEW OF SYSTEMS: ?Review of Systems  ?Cardiovascular:  Positive for chest pain (improved) and dyspnea on exertion (improved). Negative for leg swelling, near-syncope, orthopnea, palpitations, paroxysmal nocturnal dyspnea and syncope.  ?Respiratory:  Positive for shortness of breath.   ? ?PHYSICAL EXAM: ? ?  06/30/2021  ?  1:36 PM 06/18/2021  ?  2:07 PM 06/18/2021  ?  1:27 PM  ?Vitals with BMI  ?Height 6\' 1"     ?Weight 220 lbs    ?BMI 29.03    ?Systolic 128 116  ?Diastolic 80 75 93  ?Pulse 67 71 66  ? ? ?CONSTITUTIONAL: Well-developed and well-nourished. No acute distress.  ?SKIN: Skin is warm and dry. No rash noted. No cyanosis. No pallor. No jaundice ?HEAD: Normocephalic and atraumatic.  ?EYES: No scleral icterus ?MOUTH/THROAT: Moist oral membranes.  ?NECK: No JVD present. No thyromegaly noted. No carotid bruits  ?LYMPHATIC: No visible cervical adenopathy.  ?CHEST Normal respiratory effort. No intercostal retractions  ?LUNGS: Clear to auscultation bilaterally.  No stridor. No wheezes. No rales.  ?CARDIOVASCULAR: Regular rate and rhythm, positive S1-S2, no murmurs rubs or gallops  appreciated. ?ABDOMINAL: Soft, nontender, distended, positive bowel sounds in all 4 quadrants, no apparent ascites.  ?EXTREMITIES: No peripheral edema, warm to touch, 2+ bilateral DP and PT pulses ?HEMATOLOGIC: No significant bruising ?NEUROLOGIC: Oriented to person, place, and time. Nonfocal. Normal muscle tone.  ?PSYCHIATRIC: Normal mood and affect. Normal behavior. Cooperative ? ?CARDIAC DATABASE: ?EKG: ?05/20/2021: NSR, 92bpm, normal axis, without underlying injury pattern.  ? ?Echocardiogram: ?05/22/2021:  ?Left ventricle cavity is normal in size. Normal left ventricular wall thickness. Hypokinetic global wall motion. Normal diastolic filling pattern. Mildly depressed LV systolic function with visual EF 45-50%. ?Left atrial cavity is mildly dilated. ?Compared to 08/19/2018, LVEF previously reported at 50 to 55%, however on 08/11/2016, EF was reported to be 40 to 45%. Overall no significant change. ? ?Stress Testing: ?No results found for this or any previous visit from the past 1095 days. ? ? ?Heart Catheterization: ?None ? ?CT Cardiac Scoring: ?05/28/2021 ?Left Main: 0 ?LAD: 7.8 ?LCx: 4.7 ?RCA: 3.5 ?Total CAC 16 AU, 75th percentile for patient's age, sex, and race. ?Noncardiac findings:  ? ?CCTA ?06/18/2021: ?1. Total coronary calcium score of 32.3. This was 81st percentile for age and sex matched control. ?  ?2. Normal coronary origin  with right dominance. ?  ?3. CAD-RADS = 2 Mild non-obstructive CAD. ?Left main: Patent. ?LAD: Minimal (0-24%) mixed plaque within the proximal / mid segment. ?LCx: Mild stenosis (25-49%) due to noncalcified plaque at mid LCX but accuracy is limited due to artifact. ?RCA: Mild stenosis (25-49%) due to mixed plaque proximal to mid RCA. ?4. Study is sent for CT-FFR to further evaluate the LCX. Findings will be performed and reported separately. ?5. No significant incidental findings. ? ?CT-FFR Impression:  ?1. CT FFR analysis showed no significant stenosis in Left main, LAD, and RCA.   ?2. Possible hemodynamically significant stenosis within the mid-distal LCx, CT-FFR valves are within the indeterminate zone, clinical correlation required.  ? ?Recommendations: ?Goal directed and uptitration

## 2021-07-02 LAB — LIPID PANEL WITH LDL/HDL RATIO
Cholesterol, Total: 169 mg/dL (ref 100–199)
HDL: 29 mg/dL — ABNORMAL LOW (ref >39)
LDL Chol Calc (NIH): 78 mg/dL (ref 0–99)
LDL/HDL Ratio: 2.7 ratio (ref 0.0–3.6)
Triglycerides: 387 mg/dL — ABNORMAL HIGH (ref 0–149)
VLDL Cholesterol Cal: 62 mg/dL — ABNORMAL HIGH (ref 5–40)

## 2021-07-02 LAB — LDL CHOLESTEROL, DIRECT: LDL Direct: 88 mg/dL (ref 0–99)

## 2021-07-09 ENCOUNTER — Ambulatory Visit (HOSPITAL_COMMUNITY)
Admission: RE | Admit: 2021-07-09 | Discharge: 2021-07-09 | Disposition: A | Discharge status: Home / Self Care | Payer: BC Managed Care – PPO | Source: Ambulatory Visit | Attending: Cardiology | Admitting: Cardiology

## 2021-07-09 DIAGNOSIS — R072 Precordial pain: Secondary | ICD-10-CM

## 2021-07-09 DIAGNOSIS — R931 Abnormal findings on diagnostic imaging of heart and coronary circulation: Secondary | ICD-10-CM | POA: Insufficient documentation

## 2021-07-12 ENCOUNTER — Other Ambulatory Visit: Payer: Self-pay | Admitting: Cardiology

## 2021-07-12 DIAGNOSIS — R072 Precordial pain: Secondary | ICD-10-CM

## 2021-07-12 DIAGNOSIS — I10 Essential (primary) hypertension: Secondary | ICD-10-CM

## 2021-07-13 MED ORDER — VASCEPA 1 G PO CAPS: 2.0000 g | ORAL_CAPSULE | Freq: Two times a day (BID) | ORAL | 2 refills | Status: DC

## 2021-07-14 NOTE — Progress Notes (Signed)
Tried calling patient no answer left a vm

## 2021-07-15 ENCOUNTER — Other Ambulatory Visit: Payer: Self-pay

## 2021-07-15 DIAGNOSIS — R072 Precordial pain: Secondary | ICD-10-CM

## 2021-07-15 DIAGNOSIS — I10 Essential (primary) hypertension: Secondary | ICD-10-CM

## 2021-07-15 MED ORDER — METOPROLOL SUCCINATE ER 100 MG PO TB24: 100.0000 mg | ORAL_TABLET | ORAL | 0 refills | Status: DC

## 2021-07-15 NOTE — Progress Notes (Signed)
Called and spoke to pt regarding test results. We have submitted a prior auth first for his Vascepa. Pt stated he will try to get his labs done tomorrow.

## 2021-07-28 ENCOUNTER — Other Ambulatory Visit: Payer: Self-pay | Admitting: Cardiology

## 2021-07-28 DIAGNOSIS — I5042 Chronic combined systolic (congestive) and diastolic (congestive) heart failure: Secondary | ICD-10-CM

## 2021-07-31 ENCOUNTER — Other Ambulatory Visit: Payer: Self-pay | Admitting: Cardiology

## 2021-07-31 DIAGNOSIS — I10 Essential (primary) hypertension: Secondary | ICD-10-CM

## 2021-07-31 DIAGNOSIS — R072 Precordial pain: Secondary | ICD-10-CM

## 2021-08-01 ENCOUNTER — Other Ambulatory Visit: Payer: Self-pay | Admitting: Cardiology

## 2021-08-01 DIAGNOSIS — I5042 Chronic combined systolic (congestive) and diastolic (congestive) heart failure: Secondary | ICD-10-CM

## 2021-08-12 ENCOUNTER — Ambulatory Visit: Payer: BC Managed Care – PPO | Admitting: Cardiology

## 2021-08-12 ENCOUNTER — Encounter: Payer: Self-pay | Admitting: Cardiology

## 2021-08-12 VITALS — BP 133/82 | HR 93 | Resp 16 | Ht 73.0 in | Wt 225.0 lb

## 2021-08-12 DIAGNOSIS — E119 Type 2 diabetes mellitus without complications: Secondary | ICD-10-CM

## 2021-08-12 DIAGNOSIS — E785 Hyperlipidemia, unspecified: Secondary | ICD-10-CM

## 2021-08-12 DIAGNOSIS — Z8249 Family history of ischemic heart disease and other diseases of the circulatory system: Secondary | ICD-10-CM

## 2021-08-12 DIAGNOSIS — R931 Abnormal findings on diagnostic imaging of heart and coronary circulation: Secondary | ICD-10-CM

## 2021-08-12 DIAGNOSIS — Z87891 Personal history of nicotine dependence: Secondary | ICD-10-CM

## 2021-08-12 DIAGNOSIS — I5042 Chronic combined systolic (congestive) and diastolic (congestive) heart failure: Secondary | ICD-10-CM

## 2021-08-12 DIAGNOSIS — R072 Precordial pain: Secondary | ICD-10-CM

## 2021-08-12 DIAGNOSIS — R0683 Snoring: Secondary | ICD-10-CM

## 2021-08-12 DIAGNOSIS — I10 Essential (primary) hypertension: Secondary | ICD-10-CM

## 2021-08-12 DIAGNOSIS — I25118 Atherosclerotic heart disease of native coronary artery with other forms of angina pectoris: Secondary | ICD-10-CM

## 2021-08-12 MED ORDER — METOPROLOL SUCCINATE ER 100 MG PO TB24: 100.0000 mg | ORAL_TABLET | ORAL | 0 refills | Status: DC

## 2021-08-12 MED ORDER — DAPAGLIFLOZIN PROPANEDIOL 10 MG PO TABS: 10.0000 mg | ORAL_TABLET | Freq: Every day | ORAL | 0 refills | Status: DC

## 2021-08-12 NOTE — Progress Notes (Signed)
? ?ID:  Brandon Hogan, DOB 04-Apr-1971, MRN [id number] ? ?PCP:  [facility]  ?Cardiologist:  [name], DO, [facility] (established care 05/20/2021) ?Former Cardiology Providers: Donnelly Stager, NP ? ?Date: 08/12/21 ?Last Office Visit: 06/30/2021 ? ?Chief Complaint  ?Patient presents with  ? Coronary Artery Disease  ? Follow-up  ? ?HPI  ?Brandon Hogan is a 51 y.o. Caucasian male whose past medical history and cardiovascular risk factors include: Non-obstructive CAD per CCTA, mild CAC (32.2AU, 81st percentile), Hypertension, hyperlipidemia, family history of premature CAD, type 2 diabetes, former smoker, history of excessive alcohol use.  ? ?He establish care in February 2023 for evaluation of chest pain after stopping all of his medications for at least 1 year or more.  The initial plan was to restart his original medications and given his risk factors and precordial discomfort likely cardiac he underwent an echocardiogram and coronary CTA.  Echocardiogram notes mildly reduced LVEF with global hypokinesis and coronary CTA and noted mild CAC and nonobstructive CAD.  He underwent CT FFR to evaluate the significance of the LCx lesion.  And based on CT FFR values the lesion is considered to be intermediate.  The findings were discussed with him at last visit in the shared decision was to uptitrate GDMT. ? ?Since last office visit patient states that his chest pain continues to improve but not entirely resolved.  Patient states that when he overexerts himself like doing yard activity precordial discomfort is noticeable and improves with resting.  He has not required sublingual nitroglycerin tablets. ? ?Since last office visit he was noted to have hypertriglyceridemia upon rechecking his fasting labs.  He was prescribed Vascepa but he has not taken the medication due to it being cost prohibitive. ? ?Other cardiovascular risk factors include family history of premature CAD. Both father and brother  had myocardial infarction's at the age of 64.  Father status post four-vessel bypass in brother has also undergone CABG.  Mother passed away at the age of 2 due to myocardial infarction.  About 10 years ago he used to drink 12-14 bottles of beer daily for 4 to 5 years. Patient works as an Designer, multimedia. ? ?FUNCTIONAL STATUS: ?No structured exercise program or daily routine.  ? ?ALLERGIES: ?No Known Allergies ? ?MEDICATION LIST PRIOR TO VISIT: ?Current Meds  ?Medication Sig  ? allopurinol (ZYLOPRIM) 100 MG tablet Take 100 mg by mouth 2 (two) times daily.  ? atorvastatin (LIPITOR) 40 MG tablet Take 1 tablet (40 mg total) by mouth daily.  ? dapagliflozin propanediol (FARXIGA) 10 MG TABS tablet Take 1 tablet (10 mg total) by mouth daily before breakfast.  ? ENTRESTO 49-51 MG TAKE 1 TABLET BY MOUTH TWICE DAILY  ? metFORMIN (GLUCOPHAGE-XR) 500 MG 24 hr tablet Take 500 mg by mouth every morning.  ? Multiple Vitamins-Minerals (MULTI-VITAMIN GUMMIES) CHEW Chew 1 tablet by mouth daily.  ? spironolactone (ALDACTONE) 25 MG tablet TAKE 1 TABLET(25 MG) BY MOUTH EVERY MORNING  ?  ? ?PAST MEDICAL HISTORY: ?Past Medical History:  ?Diagnosis Date  ? Cardiomyopathy (HCC)   ? Diabetes mellitus without complication (HCC)   ? Gallstone   ? Hyperlipidemia   ? Hypertension   ? ? ?PAST SURGICAL HISTORY: ?Past Surgical History:  ?Procedure Laterality Date  ? CHOLECYSTECTOMY N/A 09/18/2016  ? Procedure: LAPAROSCOPIC CHOLECYSTECTOMY;  Surgeon: Almond Lint, MD;  Location: [facility] OR;  Service: General;  Laterality: N/A;  ? CHOLECYSTECTOMY  2018  ? ERCP N/A 09/18/2016  ? Procedure: ENDOSCOPIC  RETROGRADE CHOLANGIOPANCREATOGRAPHY (ERCP);  Surgeon: Iva Boop, MD;  Location: [facility];  Service: Endoscopy;  Laterality: N/A;  ? ? ?FAMILY HISTORY: ?The patient family history includes Gallbladder disease in his father; Heart attack in his mother; Heart attack (age of onset: 15) in his brother and father. ? ?SOCIAL HISTORY:  ?The  patient  reports that he quit smoking about 10 years ago. His smoking use included cigarettes. He has a 50.00 pack-year smoking history. He has never used smokeless tobacco. He reports current alcohol use. He reports that he does not use drugs. ? ?REVIEW OF SYSTEMS: ?Review of Systems  ?Cardiovascular:  Positive for chest pain (improved) and dyspnea on exertion (improved). Negative for leg swelling, near-syncope, orthopnea, palpitations, paroxysmal nocturnal dyspnea and syncope.  ?Respiratory:  Positive for shortness of breath (Improved).   ? ?PHYSICAL EXAM: ? ?  08/12/2021  ?  2:19 PM 06/30/2021  ?  1:36 PM 06/18/2021  ?  2:07 PM  ?Vitals with BMI  ?Height 6\' 1"  6\' 1"    ?Weight 225 lbs 220 lbs   ?BMI 29.69 29.03   ?Systolic 133 128 492  ?Diastolic 82 80 75  ?Pulse 93 67 71  ? ? ?CONSTITUTIONAL: Well-developed and well-nourished. No acute distress.  ?SKIN: Skin is warm and dry. No rash noted. No cyanosis. No pallor. No jaundice ?HEAD: Normocephalic and atraumatic.  ?EYES: No scleral icterus ?MOUTH/THROAT: Moist oral membranes.  ?NECK: No JVD present. No thyromegaly noted. No carotid bruits  ?LYMPHATIC: No visible cervical adenopathy.  ?CHEST Normal respiratory effort. No intercostal retractions  ?LUNGS: Clear to auscultation bilaterally.  No stridor. No wheezes. No rales.  ?CARDIOVASCULAR: Regular rate and rhythm, positive S1-S2, no murmurs rubs or gallops appreciated. ?ABDOMINAL: Soft, nontender, distended, positive bowel sounds in all 4 quadrants, no apparent ascites.  ?EXTREMITIES: No peripheral edema, warm to touch, 2+ bilateral DP and PT pulses ?HEMATOLOGIC: No significant bruising ?NEUROLOGIC: Oriented to person, place, and time. Nonfocal. Normal muscle tone.  ?PSYCHIATRIC: Normal mood and affect. Normal behavior. Cooperative ?No significant change in physical examination since last office visit. ? ?CARDIAC DATABASE: ?EKG: ?05/20/2021: NSR, 92bpm, normal axis, without underlying injury pattern.   ? ?Echocardiogram: ?05/22/2021:  ?Left ventricle cavity is normal in size. Normal left ventricular wall thickness. Hypokinetic global wall motion. Normal diastolic filling pattern. Mildly depressed LV systolic function with visual EF 45-50%. ?Left atrial cavity is mildly dilated. ?Compared to 08/19/2018, LVEF previously reported at 50 to 55%, however on 08/11/2016, EF was reported to be 40 to 45%. Overall no significant change. ? ?Stress Testing: ?No results found for this or any previous visit from the past 1095 days. ? ? ?Heart Catheterization: ?None ? ?CCTA ?06/18/2021: ?1. Total coronary calcium score of 32.3. This was 81st percentile for age and sex matched control. ?  ?2. Normal coronary origin with right dominance. ?  ?3. CAD-RADS = 2 Mild non-obstructive CAD. ?Left main: Patent. ?LAD: Minimal (0-24%) mixed plaque within the proximal / mid segment. ?LCx: Mild stenosis (25-49%) due to noncalcified plaque at mid LCX but accuracy is limited due to artifact. ?RCA: Mild stenosis (25-49%) due to mixed plaque proximal to mid RCA. ?4. Study is sent for CT-FFR to further evaluate the LCX. Findings will be performed and reported separately. ?5. No significant incidental findings. ? ?CT-FFR Impression:  ?1. CT FFR analysis showed no significant stenosis in Left main, LAD, and RCA.  ?2. Possible hemodynamically significant stenosis within the mid-distal LCx, CT-FFR valves are within the indeterminate zone, clinical correlation required.  ? ?  Recommendations: ?Goal directed and uptitration of anti-anginal therapy.  ?Aggressive risk factor modification for secondary prevention of coronary artery disease.  ?If symptoms persist despite uptitration of medical therapy consider invasive angiography to further evaluate the LCX.  ? ?LABORATORY DATA: ? ?  Latest Ref Rng & Units 06/04/2021  ?  8:38 AM 05/11/2021  ?  4:45 PM 09/20/2016  ?  3:19 AM  ?CBC  ?WBC 4.0 - 10.5 K/uL  11.6   12.2    ?Hemoglobin 13.0 - 17.7 g/dL 48.1   85.6   31.4     ?Hematocrit 37.5 - 51.0 % 46.0   43.8   41.9    ?Platelets 150 - 400 K/uL  278   232    ? ? ? ?  Latest Ref Rng & Units 06/04/2021  ?  8:36 AM 05/11/2021  ?  4:45 PM 06/29/2018  ? 10:49 AM  ?CMP  ?Glucose 70 - 99 mg/dL 970   1

## 2021-08-13 ENCOUNTER — Other Ambulatory Visit: Payer: Self-pay | Admitting: Cardiology

## 2021-08-13 DIAGNOSIS — I10 Essential (primary) hypertension: Secondary | ICD-10-CM

## 2021-08-13 DIAGNOSIS — R072 Precordial pain: Secondary | ICD-10-CM

## 2021-08-22 LAB — BASIC METABOLIC PANEL
BUN/Creatinine Ratio: 14 (ref 9–20)
BUN: 16 mg/dL (ref 6–24)
CO2: 22 mmol/L (ref 20–29)
Calcium: 9.9 mg/dL (ref 8.7–10.2)
Chloride: 100 mmol/L (ref 96–106)
Creatinine, Ser: 1.18 mg/dL (ref 0.76–1.27)
Glucose: 170 mg/dL — ABNORMAL HIGH (ref 70–99)
Potassium: 4.1 mmol/L (ref 3.5–5.2)
Sodium: 138 mmol/L (ref 134–144)
eGFR: 76 mL/min/{1.73_m2} (ref >59)

## 2021-08-22 LAB — MAGNESIUM: Magnesium: 1.8 mg/dL (ref 1.6–2.3)

## 2021-09-02 ENCOUNTER — Other Ambulatory Visit: Payer: Self-pay | Admitting: Student

## 2021-09-02 DIAGNOSIS — I5042 Chronic combined systolic (congestive) and diastolic (congestive) heart failure: Secondary | ICD-10-CM

## 2021-09-02 MED ORDER — ENTRESTO 49-51 MG PO TABS: 1.0000 | ORAL_TABLET | Freq: Two times a day (BID) | ORAL | 3 refills | Status: DC

## 2021-09-03 ENCOUNTER — Other Ambulatory Visit: Payer: Self-pay | Admitting: Cardiology

## 2021-09-03 DIAGNOSIS — I10 Essential (primary) hypertension: Secondary | ICD-10-CM

## 2021-09-03 DIAGNOSIS — I5042 Chronic combined systolic (congestive) and diastolic (congestive) heart failure: Secondary | ICD-10-CM

## 2021-09-03 DIAGNOSIS — R072 Precordial pain: Secondary | ICD-10-CM

## 2021-09-03 MED ORDER — METOPROLOL SUCCINATE ER 100 MG PO TB24: 100.0000 mg | ORAL_TABLET | ORAL | 3 refills | Status: DC

## 2021-09-05 ENCOUNTER — Other Ambulatory Visit: Payer: Self-pay

## 2021-09-05 DIAGNOSIS — I5042 Chronic combined systolic (congestive) and diastolic (congestive) heart failure: Secondary | ICD-10-CM

## 2021-09-05 MED ORDER — DAPAGLIFLOZIN PROPANEDIOL 10 MG PO TABS: 10.0000 mg | ORAL_TABLET | Freq: Every day | ORAL | 0 refills | Status: DC

## 2021-09-05 NOTE — Progress Notes (Signed)
Called and spoke to patient he voiced understanding and yes he was able to get his vascepa filled

## 2021-09-15 ENCOUNTER — Other Ambulatory Visit: Payer: Self-pay | Admitting: Cardiology

## 2021-09-15 DIAGNOSIS — E785 Hyperlipidemia, unspecified: Secondary | ICD-10-CM

## 2021-09-15 DIAGNOSIS — I2510 Atherosclerotic heart disease of native coronary artery without angina pectoris: Secondary | ICD-10-CM

## 2021-10-20 ENCOUNTER — Ambulatory Visit (INDEPENDENT_AMBULATORY_CARE_PROVIDER_SITE_OTHER): Payer: BC Managed Care – PPO | Admitting: Neurology

## 2021-10-20 ENCOUNTER — Encounter: Payer: Self-pay | Admitting: Neurology

## 2021-10-20 VITALS — BP 133/85 | HR 90 | Ht 74.0 in | Wt 225.0 lb

## 2021-10-20 DIAGNOSIS — R0683 Snoring: Secondary | ICD-10-CM | POA: Diagnosis not present

## 2021-10-20 DIAGNOSIS — G8929 Other chronic pain: Secondary | ICD-10-CM | POA: Insufficient documentation

## 2021-10-20 DIAGNOSIS — I428 Other cardiomyopathies: Secondary | ICD-10-CM

## 2021-10-20 DIAGNOSIS — Z8249 Family history of ischemic heart disease and other diseases of the circulatory system: Secondary | ICD-10-CM | POA: Insufficient documentation

## 2021-10-20 DIAGNOSIS — E119 Type 2 diabetes mellitus without complications: Secondary | ICD-10-CM

## 2021-10-20 DIAGNOSIS — G4701 Insomnia due to medical condition: Secondary | ICD-10-CM | POA: Diagnosis not present

## 2021-10-20 DIAGNOSIS — R002 Palpitations: Secondary | ICD-10-CM

## 2021-10-20 DIAGNOSIS — R931 Abnormal findings on diagnostic imaging of heart and coronary circulation: Secondary | ICD-10-CM

## 2021-10-20 DIAGNOSIS — I2510 Atherosclerotic heart disease of native coronary artery without angina pectoris: Secondary | ICD-10-CM

## 2021-10-20 DIAGNOSIS — R61 Generalized hyperhidrosis: Secondary | ICD-10-CM | POA: Insufficient documentation

## 2021-10-20 DIAGNOSIS — R072 Precordial pain: Secondary | ICD-10-CM

## 2021-10-20 DIAGNOSIS — R5383 Other fatigue: Secondary | ICD-10-CM

## 2021-10-20 DIAGNOSIS — I10 Essential (primary) hypertension: Secondary | ICD-10-CM

## 2021-10-20 MED ORDER — VASCEPA 1 G PO CAPS: 2.0000 g | ORAL_CAPSULE | Freq: Two times a day (BID) | ORAL | 2 refills | Status: DC

## 2021-10-20 NOTE — Progress Notes (Signed)
SLEEP MEDICINE CLINIC    Provider:  Melvyn Novas, MD  Primary Care Physician:  [facility] [address] [place] [address]     Referring Provider: [name], Do [address] [place],  [place] [address]          Chief Complaint according to patient   Patient presents with:     New Patient (Initial Visit)           HISTORY OF PRESENT ILLNESS:  Brandon Hogan is a 51 y.o.  Caucasian male patient of Dr Brandon Hollingshead, DO, cardiology- seen here upon referral on 10/20/2021  for a sleep consultation.  Chief concern according to patient :  Pt here for sleep consult due to loud, excessive snoring, risk of sleep breathing disorder given his underlying CHF and other comorbididties. No prior sleep study. Has some nights where he feels short of breath , laboured breathing, and palpitations, chest pain, and needing to take a couple of deep breaths.   Goes to sleep at 10pm but doesn't fall asleep around 12am and waking up every hour due to going to the bathroom or going numb on one side. Has joint pain and unable to sleep on his back.    Brandon Hogan  has a past medical history of Cardiomyopathy (HCC), Diabetes mellitus without complication (HCC), Gallstone, Hyperlipidemia, and Hypertension.   No family hx of OSA. Father and brother with young onset CAD. Mother dead of MI at age 13 (!), both maternal grandparents didn't live  to see their 39s B-day.  Works days shift.   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 Sleep relevant medical history: Nocturia 2 times , no Tonsillectomy, cardiomyopathy, hypokineses.   Social history:  Patient is working in Consulting civil engineer at [employer]-  and lives in a household with spouse and younger daughter ,3 dogs . Tobacco use quit 2013 .   ETOH use : rare , not exceeding 2 beers . Heavy drinker in his 30s.  Caffeine intake in form of Coffee( /) Soda( 4 a day ) Tea ( /) no energy drinks. Regular exercise- not  because of knee and back pain.     Sleep habits are as follows: The patient's dinner time is between 5.30-6.30 PM. The patient goes to bed at 11-12 PM and continues to sleep for only 1-2  hours, wakes from numbness, pain and  for bathroom breaks, the first time at 1-2 AM.   The preferred sleep position is variable , with the support of 2 pillows. He feels hot at night- he kicks too.  Dreams are reportedly infrequent/ some are vivid. The bedroom is cool, quiet and mostly dark-  6.30 AM is the usual rise time. The patient wakes up spontaneously before his  alarm.  He sleeps at the longest until 8 AM.  He reports not feeling refreshed or restored in AM, with symptoms such as dry mouth, but no morning headaches, neck pain, knee pain, back pain- stiffness. and residual fatigue.  Naps are taken infrequently, he is not always able to initiate sleep - lasting from 15 to 20 minutes and are more refreshing than nocturnal sleep.    Review of Systems: Out of a complete 14 system review, the patient complains of only the following symptoms, and all other reviewed systems are negative.:  Fatigue, sleepiness , snoring, fragmented sleep, Insomnia - fragmented sleep by chronic pain,  nocturia.    How likely are you  to doze in the following situations: 0 = not likely, 1 = slight chance, 2 = moderate chance, 3 = high chance   Sitting and Reading? Watching Television? Sitting inactive in a public place (theater or meeting)? As a passenger in a car for an hour without a break? Lying down in the afternoon when circumstances permit? Sitting and talking to someone? Sitting quietly after lunch without alcohol? In a car, while stopped for a few minutes in traffic?   Total = 12/ 24 points   FSS endorsed at 52/ 63 points. HIGH FATIGUE SCORE.  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   Tobacco Use   Smoking status: Former    Packs/day: 2.00    Years: 25.00    Total pack years: 50.00    Types: Cigarettes    Quit date: 05/17/2011    Years since quitting: 10.4   Smokeless tobacco: Never  Vaping Use   Vaping Use: Former   Quit date: 05/16/2016  Substance and Sexual Activity   Alcohol use: Yes    Comment: rare used to drink heavily in his 30s.    Drug use: No   Sexual activity: Not on file  Other Topics Concern   Not on file  Social History Narrative   Lives at home with wife and the younger daughter   R handed   Caffeine: 4-5 soft drinks a day   Social Determinants of Health   Financial Resource Strain: Not on file  Food Insecurity: Not on file  Transportation Needs: Not on file  Physical Activity: Not on file  Stress: Not on file  Social Connections: Not on file    Family History  Problem Relation Age of Onset   Heart attack Mother    Gallbladder disease Father    Heart attack Father 31   Heart attack Brother 8    Past Medical History:  Diagnosis Date   Cardiomyopathy (HCC)    Diabetes mellitus without complication (HCC)    Gallstone    Hyperlipidemia    Hypertension     Past Surgical History:  Procedure Laterality Date   CHOLECYSTECTOMY N/A 09/18/2016   Procedure: LAPAROSCOPIC CHOLECYSTECTOMY;  Surgeon: Almond Lint, MD;  Location: [facility] OR;  Service: General;  Laterality: N/A;   CHOLECYSTECTOMY  2018   ERCP N/A 09/18/2016   Procedure: ENDOSCOPIC RETROGRADE CHOLANGIOPANCREATOGRAPHY (ERCP);  Surgeon: Iva Boop, MD;  Location: [facility];  Service: Endoscopy;  Laterality: N/A;     Current Outpatient Medications on File Prior to Visit  Medication Sig Dispense Refill   allopurinol (ZYLOPRIM) 100 MG tablet Take 100 mg by mouth 2 (two) times daily.     atorvastatin (LIPITOR) 40 MG tablet TAKE 1 TABLET(40 MG) BY MOUTH DAILY 90 tablet 0    dapagliflozin propanediol (FARXIGA) 10 MG TABS tablet Take 1 tablet (10 mg total) by mouth daily before breakfast. 90 tablet 0   ENTRESTO 49-51 MG TAKE 1 TABLET BY MOUTH TWICE DAILY 60 tablet 3   metFORMIN (GLUCOPHAGE-XR) 500 MG 24 hr tablet Take 500 mg by mouth every morning.  4   Multiple Vitamins-Minerals (MULTI-VITAMIN GUMMIES) CHEW Chew 1 tablet by mouth daily.     spironolactone (ALDACTONE) 25 MG tablet TAKE  1 TABLET(25 MG) BY MOUTH EVERY MORNING 90 tablet 0   nitroGLYCERIN (NITROSTAT) 0.4 MG SL tablet Place 1 tablet (0.4 mg total) under the tongue every 5 (five) minutes as needed for chest pain. If you require more than two tablets five minutes apart go to the nearest ER via EMS. 30 tablet 0   VASCEPA 1 g capsule   CAN NOT AFFORD  Take 2 capsules (2 g total) by mouth 2 (two) times daily. (Patient not taking: Reported on 08/12/2021) 120 capsule 2   No current facility-administered medications on file prior to visit.    No Known Allergies  Physical exam:  Today's Vitals   10/20/21 1535  BP: 133/85  Pulse: 90  Weight: 225 lb (102.1 kg)  Height: 6\' 2"  (1.88 m)   Body mass index is 28.89 kg/m.   Wt Readings from Last 3 Encounters:  10/20/21 225 lb (102.1 kg)  08/12/21 225 lb (102.1 kg)  06/30/21 220 lb (99.8 kg)     Ht Readings from Last 3 Encounters:  10/20/21 6\' 2"  (1.88 m)  08/12/21 6\' 1"  (1.854 m)  06/30/21 6\' 1"  (1.854 m)      General: The patient is awake, alert and appears not in acute distress. Head: Normocephalic, atraumatic. Neck is supple. Mallampati 1-2,  neck circumference: 17. 5  inches . Nasal airflow  patent.  Retrognathia is not seen.  Dental status: poor, wears a mouth guard for snoring , not made to measure-   Cardiovascular:  Regular rate and cardiac rhythm by pulse,  without distended neck veins. Respiratory: Lungs are clear to auscultation.  Skin:  Without evidence of ankle edema, or rash. Trunk: The patient's posture is erect.   Neurologic exam  : The patient is awake and alert, oriented to place and time.   Memory subjective described as intact.  Attention span & concentration ability appears normal.  Speech is fluent,  without  dysarthria, dysphonia or aphasia.  Mood and affect are depressed, pessimistic,    Cranial nerves: no loss of smell or taste reported  Pupils are equal and briskly reactive to light. Funduscopic exam deferred..  Extraocular movements in vertical and horizontal planes were intact and without nystagmus. Visual fields by finger perimetry are intact. Hearing was intact to soft voice and finger rubbing.    Facial sensation intact to fine touch.  Facial motor strength is symmetric and tongue and uvula move midline.  Neck ROM : rotation, tilt and flexion extension were normal for age and shoulder shrug was symmetrical.    Motor exam:  Symmetric bulk, tone and ROM.   Normal tone without cog wheeling, symmetric grip strength .   Sensory:  Fine touch, pinprick and vibration were .  Proprioception tested in the upper extremities was normal.   Coordination: Rapid alternating movements in the fingers/hands were of normal speed.  The Finger-to-nose maneuver was intact without evidence of ataxia, dysmetria or tremor.   Gait and station: Patient could rise unassisted from a seated position, walked without assistive device.  Stance is of normal width/ base and the patient turned with 4 steps.  Toe and heel walk were deferred.  Deep tendon reflexes: in the  upper and lower extremities are symmetric and intact.  Babinski response was deferred .       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  In chronic pain, causing sleep fragmentation and constant restless changing of position. Has never seen an orthopedist for his joint pain ?    2) snoring loudly, witnessed apnea per spouse. Has struggled with sleep for years- wife moved to another bedroom.  Very frequent nocturia can be related to OSA too.    3) HTN, DM diagnosed in 2020. He had been non compliant with medication while separated from wife .   4) diagnosed with cardiomyopathy after symptoms of hot flashes, SOB, palpitations, and feeling clammy at night.  The patient underwent echocardiogram with Dr. Billy Coast in February 2023 showing left ventricular wall thickness to be normal but hypokinetic global wall motion.  Mildly depressed left ventricular systolic ejection fraction 45%, left atrium mildly dilated.  This was compared to a previous echo from Aug 19, 2018 and the patient had an EF of 50 to 55%.  2 years prior to that and ejection fraction was reported to be only 40%.  No recent stress testing no history of heart catheterizations, CCTA in March 2020 throat total calcium coronary score of 32.3 81st percentile for age and sex matched control.  Mild nonobstructive coronary artery disease.  Mild stenosis of the left circumflex, mild stenosis of the right circumflex between 25 and 50% for each vessel.  Antianginal therapy had been started.  Patient had high normal levels for hemoglobin and hematocrit.  Elevated fasting glucose, creatinine 1.35, normal sodium and potassium.  Triglycerides were high then did cholesterol also HDL, was 29 and therefore low.  This may be corrected with exercise regimen.  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    My Plan is to proceed with:  1)attended sleep study-  I need to see heart rate and rhythm, he never  had a cardiac monitor, and he still reports diaphoresis and heat sensitivity, all can be related t metabolic syndrome and to CAD, he had normal TSH  in hospital.    I would like to thank [facility] [place], [place], [address] [place],  [place] [address] for allowing me to meet with and to take care of this pleasant patient.   In short, Brandon Hogan is presenting with nocturnal diaphoresis, palpitations, with underlying CAD, cardiomyopathy, on pain medication, wakes groggy and non refreshed, , all symptoms that can be attributed to CSA or OSA , and hypoxia..   I plan to follow up either personally or through our NP within 2-3  months.   CC: I will share my notes with Dr Brandon Hogan. .  Electronically signed by: Melvyn Novas, MD 10/20/2021 3:42 PM  [facility] Board certified by The [employer] of Sleep Medicine and Diplomate of the [employer] of Sleep Medicine. Board certified In Neurology through the ABPN, Fellow of the [employer] of Neurology. Medical Director of [facility].

## 2021-10-20 NOTE — Patient Instructions (Signed)
Insomnia Insomnia is a sleep disorder that makes it difficult to fall asleep or stay asleep. Insomnia can cause fatigue, low energy, difficulty concentrating, mood swings, and poor performance at work or school. There are three different ways to classify insomnia: Difficulty falling asleep. Difficulty staying asleep. Waking up too early in the morning. Any type of insomnia can be long-term (chronic) or short-term (acute). Both are common. Short-term insomnia usually lasts for 3 months or less. Chronic insomnia occurs at least three times a week for longer than 3 months. What are the causes? Insomnia may be caused by another condition, situation, or substance, such as: Having certain mental health conditions, such as anxiety and depression. Using caffeine, alcohol, tobacco, or drugs. Having gastrointestinal conditions, such as gastroesophageal reflux disease (GERD). Having certain medical conditions. These include: Asthma. Alzheimer's disease. Stroke. Chronic pain. An overactive thyroid gland (hyperthyroidism). Other sleep disorders, such as restless legs syndrome and sleep apnea. Menopause. Sometimes, the cause of insomnia may not be known. What increases the risk? Risk factors for insomnia include: Gender. Females are affected more often than males. Age. Insomnia is more common as people get older. Stress and certain medical and mental health conditions. Lack of exercise. Having an irregular work schedule. This may include working night shifts and traveling between different time zones. What are the signs or symptoms? If you have insomnia, the main symptom is having trouble falling asleep or having trouble staying asleep. This may lead to other symptoms, such as: Feeling tired or having low energy. Feeling nervous about going to sleep. Not feeling rested in the morning. Having trouble concentrating. Feeling irritable, anxious, or depressed. How is this diagnosed? This condition  may be diagnosed based on: Your symptoms and medical history. Your health care provider may ask about: Your sleep habits. Any medical conditions you have. Your mental health. A physical exam. How is this treated? Treatment for insomnia depends on the cause. Treatment may focus on treating an underlying condition that is causing the insomnia. Treatment may also include: Medicines to help you sleep. Counseling or therapy. Lifestyle adjustments to help you sleep better. Follow these instructions at home: Eating and drinking  Limit or avoid alcohol, caffeinated beverages, and products that contain nicotine and tobacco, especially close to bedtime. These can disrupt your sleep. Do not eat a large meal or eat spicy foods right before bedtime. This can lead to digestive discomfort that can make it hard for you to sleep. Sleep habits  Keep a sleep diary to help you and your health care provider figure out what could be causing your insomnia. Write down: When you sleep. When you wake up during the night. How well you sleep and how rested you feel the next day. Any side effects of medicines you are taking. What you eat and drink. Make your bedroom a dark, comfortable place where it is easy to fall asleep. Put up shades or blackout curtains to block light from outside. Use a white noise machine to block noise. Keep the temperature cool. Limit screen use before bedtime. This includes: Not watching TV. Not using your smartphone, tablet, or computer. Stick to a routine that includes going to bed and waking up at the same times every day and night. This can help you fall asleep faster. Consider making a quiet activity, such as reading, part of your nighttime routine. Try to avoid taking naps during the day so that you sleep better at night. Get out of bed if you are still awake after   15 minutes of trying to sleep. Keep the lights down, but try reading or doing a quiet activity. When you feel  sleepy, go back to bed. General instructions Take over-the-counter and prescription medicines only as told by your health care provider. Exercise regularly as told by your health care provider. However, avoid exercising in the hours right before bedtime. Use relaxation techniques to manage stress. Ask your health care provider to suggest some techniques that may work well for you. These may include: Breathing exercises. Routines to release muscle tension. Visualizing peaceful scenes. Make sure that you drive carefully. Do not drive if you feel very sleepy. Keep all follow-up visits. This is important. Contact a health care provider if: You are tired throughout the day. You have trouble in your daily routine due to sleepiness. You continue to have sleep problems, or your sleep problems get worse. Get help right away if: You have thoughts about hurting yourself or someone else. Get help right away if you feel like you may hurt yourself or others, or have thoughts about taking your own life. Go to your nearest emergency room or: Call 911. Call the [facility] at [phone] or 988. This is open 24 hours a day. Text the Crisis Text Line at 801-192-4442. Summary Insomnia is a sleep disorder that makes it difficult to fall asleep or stay asleep. Insomnia can be long-term (chronic) or short-term (acute). Treatment for insomnia depends on the cause. Treatment may focus on treating an underlying condition that is causing the insomnia. Keep a sleep diary to help you and your health care provider figure out what could be causing your insomnia. This information is not intended to replace advice given to you by your health care provider. Make sure you discuss any questions you have with your health care provider. Document Revised: 03/03/2021 Document Reviewed: 03/03/2021 Elsevier Patient Education  [place]. Sleep Apnea Sleep apnea affects breathing during sleep. It  causes breathing to stop for 10 seconds or more, or to become shallow. People with sleep apnea usually snore loudly. It can also increase the risk of: Heart attack. Stroke. Being very overweight (obese). Diabetes. Heart failure. Irregular heartbeat. High blood pressure. The goal of treatment is to help you breathe normally again. What are the causes?  The most common cause of this condition is a collapsed or blocked airway. There are three kinds of sleep apnea: Obstructive sleep apnea. This is caused by a blocked or collapsed airway. Central sleep apnea. This happens when the brain does not send the right signals to the muscles that control breathing. Mixed sleep apnea. This is a combination of obstructive and central sleep apnea. What increases the risk? Being overweight. Smoking. Having a small airway. Being older. Being male. Drinking alcohol. Taking medicines to calm yourself (sedatives or tranquilizers). Having family members with the condition. Having a tongue or tonsils that are larger than normal. What are the signs or symptoms? Trouble staying asleep. Loud snoring. Headaches in the morning. Waking up gasping. Dry mouth or sore throat in the morning. Being sleepy or tired during the day. If you are sleepy or tired during the day, you may also: Not be able to focus your mind (concentrate). Forget things. Get angry a lot and have mood swings. Feel sad (depressed). Have changes in your personality. Have less interest in sex, if you are male. Be unable to have an erection, if you are male. How is this treated?  Sleeping on your side. Using a medicine  to get rid of mucus in your nose (decongestant). Avoiding the use of alcohol, medicines to help you relax, or certain pain medicines (narcotics). Losing weight, if needed. Changing your diet. Quitting smoking. Using a machine to open your airway while you sleep, such as: An oral appliance. This is a mouthpiece  that shifts your lower jaw forward. A CPAP device. This device blows air through a mask when you breathe out (exhale). An EPAP device. This has valves that you put in each nostril. A BIPAP device. This device blows air through a mask when you breathe in (inhale) and breathe out. Having surgery if other treatments do not work. Follow these instructions at home: Lifestyle Make changes that your doctor recommends. Eat a healthy diet. Lose weight if needed. Avoid alcohol, medicines to help you relax, and some pain medicines. Do not smoke or use any products that contain nicotine or tobacco. If you need help quitting, ask your doctor. General instructions Take over-the-counter and prescription medicines only as told by your doctor. If you were given a machine to use while you sleep, use it only as told by your doctor. If you are having surgery, make sure to tell your doctor you have sleep apnea. You may need to bring your device with you. Keep all follow-up visits. Contact a doctor if: The machine that you were given to use during sleep bothers you or does not seem to be working. You do not get better. You get worse. Get help right away if: Your chest hurts. You have trouble breathing in enough air. You have an uncomfortable feeling in your back, arms, or stomach. You have trouble talking. One side of your body feels weak. A part of your face is hanging down. These symptoms may be an emergency. Get help right away. Call your local emergency services (911 in the [place] Do not wait to see if the symptoms will go away. Do not drive yourself to the hospital. Summary This condition affects breathing during sleep. The most common cause is a collapsed or blocked airway. The goal of treatment is to help you breathe normally while you sleep. This information is not intended to replace advice given to you by your health care provider. Make sure you discuss any questions you have with your health  care provider. Document Revised: 10/30/2020 Document Reviewed: 03/01/2020 Elsevier Patient Education  2023 [employer] Screening for Sleep Apnea  Sleep apnea is a condition in which breathing pauses or becomes shallow during sleep. Sleep apnea screening is a test to determine if you are at risk for sleep apnea. The test includes a series of questions. It will only takes a few minutes. Your health care provider may ask you to have this test in preparation for surgery or as part of a physical exam. What are the symptoms of sleep apnea? Common symptoms of sleep apnea include: Snoring. Waking up often at night. Daytime sleepiness. Pauses in breathing. Choking or gasping during sleep. Irritability. Forgetfulness. Trouble thinking clearly. Depression. Personality changes. Most people with sleep apnea do not know that they have it. What are the advantages of sleep apnea screening? Getting screened for sleep apnea can help: Ensure your safety. It is important for your health care providers to know whether or not you have sleep apnea, especially if you are having surgery or have other long-term (chronic) health conditions. Improve your health and allow you to get a better night's rest. Restful sleep can help you: Have more energy. Lose weight. Improve  high blood pressure. Improve diabetes management. Prevent stroke. Prevent car accidents. What happens during the screening? Screening usually includes being asked a list of questions about your sleep quality. Some questions you may be asked include: Do you snore? Is your sleep restless? Do you have daytime sleepiness? Has a partner or spouse told you that you stop breathing during sleep? Have you had trouble concentrating or memory loss? What is your age? What is your neck circumference? To measure your neck, keep your back straight and gently wrap the tape measure around your neck. Put the tape measure at the middle of your neck,  between your chin and collarbone. What is your sex assigned at birth? Do you have or are you being treated for high blood pressure? If your screening test is positive, you are at risk for the condition. Further testing may be needed to confirm a diagnosis of sleep apnea. Where to find more information You can find screening tools online or at your health care clinic. For more information about sleep apnea screening and healthy sleep, visit these websites: Centers for Disease Control and Prevention: [employer] American Sleep Apnea Association: www.sleepapnea.org Contact a health care provider if: You think that you may have sleep apnea. Summary Sleep apnea screening can help determine if you are at risk for sleep apnea. It is important for your health care providers to know whether or not you have sleep apnea, especially if you are having surgery or have other chronic health conditions. You may be asked to take a screening test for sleep apnea in preparation for surgery or as part of a physical exam. This information is not intended to replace advice given to you by your health care provider. Make sure you discuss any questions you have with your health care provider. Document Revised: 03/01/2020 Document Reviewed: 03/01/2020 Elsevier Patient Education  2023 [employer].

## 2021-11-05 ENCOUNTER — Telehealth: Payer: Self-pay | Admitting: Neurology

## 2021-11-05 NOTE — Telephone Encounter (Signed)
Sent mychart message

## 2021-11-08 ENCOUNTER — Other Ambulatory Visit: Payer: Self-pay | Admitting: Cardiology

## 2021-11-08 DIAGNOSIS — R072 Precordial pain: Secondary | ICD-10-CM

## 2021-11-08 DIAGNOSIS — I10 Essential (primary) hypertension: Secondary | ICD-10-CM

## 2021-11-13 ENCOUNTER — Encounter: Payer: Self-pay | Admitting: Cardiology

## 2021-11-13 ENCOUNTER — Other Ambulatory Visit: Payer: Self-pay | Admitting: Cardiology

## 2021-11-13 ENCOUNTER — Ambulatory Visit: Payer: BC Managed Care – PPO | Admitting: Cardiology

## 2021-11-13 ENCOUNTER — Telehealth: Payer: Self-pay | Admitting: Neurology

## 2021-11-13 VITALS — BP 152/86 | HR 88 | Temp 98.3°F | Resp 16 | Ht 74.0 in | Wt 231.0 lb

## 2021-11-13 DIAGNOSIS — Z8249 Family history of ischemic heart disease and other diseases of the circulatory system: Secondary | ICD-10-CM

## 2021-11-13 DIAGNOSIS — R931 Abnormal findings on diagnostic imaging of heart and coronary circulation: Secondary | ICD-10-CM

## 2021-11-13 DIAGNOSIS — E119 Type 2 diabetes mellitus without complications: Secondary | ICD-10-CM

## 2021-11-13 DIAGNOSIS — E785 Hyperlipidemia, unspecified: Secondary | ICD-10-CM

## 2021-11-13 DIAGNOSIS — Z87891 Personal history of nicotine dependence: Secondary | ICD-10-CM

## 2021-11-13 DIAGNOSIS — I5042 Chronic combined systolic (congestive) and diastolic (congestive) heart failure: Secondary | ICD-10-CM

## 2021-11-13 DIAGNOSIS — I2510 Atherosclerotic heart disease of native coronary artery without angina pectoris: Secondary | ICD-10-CM

## 2021-11-13 DIAGNOSIS — E781 Pure hyperglyceridemia: Secondary | ICD-10-CM

## 2021-11-13 DIAGNOSIS — I10 Essential (primary) hypertension: Secondary | ICD-10-CM

## 2021-11-13 MED ORDER — METOPROLOL SUCCINATE ER 50 MG PO TB24: 50.0000 mg | ORAL_TABLET | Freq: Every day | ORAL | 0 refills | Status: DC

## 2021-11-13 MED ORDER — DAPAGLIFLOZIN PROPANEDIOL 10 MG PO TABS: 10.0000 mg | ORAL_TABLET | Freq: Every day | ORAL | 0 refills | Status: DC

## 2021-11-13 NOTE — Telephone Encounter (Signed)
Pt came in requesting a call to set up his sleep study, pt gave insurance information that office did not have.

## 2021-11-13 NOTE — Progress Notes (Signed)
ID:  Brandon Hogan, DOB [birth year], MRN [id number]  PCP:  [facility]  Cardiologist:  Rex Kras, DO, [facility], [place] (established care 05/20/2021) Former Cardiology Providers: Binnie Kand, NP  Date: 11/13/21 Last Office Visit: 08/12/2021  Chief Complaint  Patient presents with   Coronary Artery Disease   Congestive Heart Failure   Follow-up    3 month   HPI  Brandon Hogan is a 51 y.o. Caucasian male whose past medical history and cardiovascular risk factors include: Non-obstructive CAD per CCTA, mild CAC (32.2AU, 81st percentile), chronic combined systolic and diastolic heart failure, hypertension, hyperlipidemia, family history of premature CAD, type 2 diabetes, former smoker, history of excessive alcohol use.   In February 2023 he presented to the office for evaluation of chest pain after stopping all of his medications for at least 1 year or more prior to the visit.  Initially I placed him back on his original medications and risk stratified him with echo and coronary CTA.  Echocardiogram noted mildly with mildly reduced LVEF with global hypokinesis and coronary CTA noted nonobstructive CAD with mild coronary artery calcification.  CT FFR did not illustrate hemodynamically significant stenosis but disease in the mid LCx was noted to be in the intermediate zone therefore clinical correlation is required.  We discussed undergoing invasive angiography for further evaluation of LCx disease versus up titration of medical therapy.  However by that time with up titration of GDMT he was asymptomatic and preferred to follow his disease medically.  He now presents for 61-month follow-up visit.  Patient states that he is not having anginal discomfort.  He is not experiencing shortness of breath with regular activity but it is still present with overexertion.  The patient has not been checking his blood pressures at home.  He has not been able to fill his metoprolol and Farxiga  prescription due to drug availability at his pharmacy.  Patient has establish care with Dr. Brett Fairy and plans to undergo sleep study in the near future.    Family history of premature CAD with both father and brother having MI in their 69s and they both went bypass surgery.  Mom died at the age of 72 due to myocardial infarction.  About 10 years ago he used to drink 12-14 bottles of beer on a daily basis for 4-5 years.  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  FUNCTIONAL STATUS: No structured exercise program or daily routine.   ALLERGIES: No Known Allergies  MEDICATION LIST PRIOR TO VISIT: Current Meds  Medication Sig   allopurinol (ZYLOPRIM) 100 MG tablet Take 100 mg by mouth 2 (two) times daily.   atorvastatin (LIPITOR) 40 MG tablet TAKE 1 TABLET(40 MG) BY MOUTH DAILY   ENTRESTO 49-51 MG TAKE 1 TABLET BY MOUTH TWICE DAILY   metFORMIN (GLUCOPHAGE-XR) 500 MG 24 hr tablet Take 500 mg by mouth every morning.   metoprolol succinate (TOPROL XL) 50 MG 24 hr tablet Take 1 tablet (50 mg total) by mouth daily. Take with or immediately following a meal.   Multiple Vitamins-Minerals (MULTI-VITAMIN GUMMIES) CHEW Chew 1 tablet by mouth daily.   spironolactone (ALDACTONE) 25 MG tablet TAKE 1 TABLET(25 MG) BY MOUTH EVERY MORNING   VASCEPA 1 g capsule Take 2 capsules (2 g total) by mouth 2 (two) times daily.   [DISCONTINUED] dapagliflozin propanediol (FARXIGA) 10 MG TABS tablet Take 1 tablet (10 mg total) by mouth daily before breakfast.     PAST MEDICAL HISTORY: Past Medical History:  Diagnosis Date   Cardiomyopathy ([place])    Diabetes mellitus without complication ([place])    Gallstone    Hyperlipidemia    Hypertension     PAST SURGICAL HISTORY: Past Surgical History:  Procedure Laterality Date   CHOLECYSTECTOMY N/A 09/18/2016   Procedure: LAPAROSCOPIC CHOLECYSTECTOMY;  Surgeon: Stark Klein, MD;  Location: [place];  Service: General;  Laterality: N/A;   CHOLECYSTECTOMY   2018   ERCP N/A 09/18/2016   Procedure: ENDOSCOPIC RETROGRADE CHOLANGIOPANCREATOGRAPHY (ERCP);  Surgeon: Gatha Mayer, MD;  Location: [place];  Service: Endoscopy;  Laterality: N/A;    FAMILY HISTORY: The patient family history includes Gallbladder disease in his father; Heart attack in his mother; Heart attack (age of onset: 58) in his brother and father.  SOCIAL HISTORY:  The patient  reports that he quit smoking about 10 years ago. His smoking use included cigarettes. He has a 50.00 pack-year smoking history. He has never used smokeless tobacco. He reports current alcohol use. He reports that he does not use drugs.  REVIEW OF SYSTEMS: Review of Systems  Cardiovascular:  Positive for dyspnea on exertion (chronic and stable). Negative for chest pain, claudication, irregular heartbeat, leg swelling, near-syncope, orthopnea, palpitations, paroxysmal nocturnal dyspnea and syncope.  Respiratory:  Negative for shortness of breath.   Hematologic/Lymphatic: Negative for bleeding problem.  Musculoskeletal:  Negative for muscle cramps and myalgias.  Neurological:  Negative for dizziness and light-headedness.    PHYSICAL EXAM:    11/13/2021    3:19 PM 11/13/2021    2:57 PM 11/13/2021    2:56 PM  Vitals with BMI  Height   '6\' 2"'$   Weight   231 lbs  BMI   98.33  Systolic 825 053 976  Diastolic 86 99 92  Pulse 88 87 80    CONSTITUTIONAL: Well-developed and well-nourished. No acute distress.  SKIN: Skin is warm and dry. No rash noted. No cyanosis. No pallor. No jaundice HEAD: Normocephalic and atraumatic.  EYES: No scleral icterus MOUTH/THROAT: Moist oral membranes.  NECK: No JVD present. No thyromegaly noted. No carotid bruits  LYMPHATIC: No visible cervical adenopathy.  CHEST Normal respiratory effort. No intercostal retractions  LUNGS: Clear to auscultation bilaterally.  No stridor. No wheezes. No rales.  CARDIOVASCULAR: Regular rate and rhythm, positive S1-S2, no murmurs rubs or  gallops appreciated. ABDOMINAL: Soft, nontender, distended, positive bowel sounds in all 4 quadrants, no apparent ascites.  EXTREMITIES: No peripheral edema, warm to touch, 2+ bilateral DP and PT pulses HEMATOLOGIC: No significant bruising NEUROLOGIC: Oriented to person, place, and time. Nonfocal. Normal muscle tone.  PSYCHIATRIC: Normal mood and affect. Normal behavior. Cooperative No significant change in physical examination since last office visit.  CARDIAC DATABASE: EKG: 11/13/2021: Normal sinus rhythm, 83 bpm, without underlying injury pattern.   Echocardiogram: 05/22/2021:  Left ventricle cavity is normal in size. Normal left ventricular wall thickness. Hypokinetic global wall motion. Normal diastolic filling pattern. Mildly depressed LV systolic function with visual EF 45-50%. Left atrial cavity is mildly dilated. Compared to 08/19/2018, LVEF previously reported at 50 to 55%, however on 08/11/2016, EF was reported to be 40 to 45%. Overall no significant change.  Stress Testing: No results found for this or any previous visit from the past 1095 days.   Heart Catheterization: None  CCTA 06/18/2021: 1. Total coronary calcium score of 32.3. This was 81st percentile for age and sex matched control.   2. Normal coronary origin with right dominance.   3. CAD-RADS = 2 Mild non-obstructive CAD. Left  main: Patent. LAD: Minimal (0-24%) mixed plaque within the proximal / mid segment. LCx: Mild stenosis (25-49%) due to noncalcified plaque at mid LCX but accuracy is limited due to artifact. RCA: Mild stenosis (25-49%) due to mixed plaque proximal to mid RCA. 4. Study is sent for CT-FFR to further evaluate the LCX. Findings will be performed and reported separately. 5. No significant incidental findings.  CT-FFR Impression:  1. CT FFR analysis showed no significant stenosis in Left main, LAD, and RCA.  2. Possible hemodynamically significant stenosis within the mid-distal LCx, CT-FFR  valves are within the indeterminate zone, clinical correlation required.   Recommendations: Goal directed and uptitration of anti-anginal therapy.  Aggressive risk factor modification for secondary prevention of coronary artery disease.  If symptoms persist despite uptitration of medical therapy consider invasive angiography to further evaluate the LCX.   LABORATORY DATA:    Latest Ref Rng & Units 06/04/2021    8:38 AM 05/11/2021    4:45 PM 09/20/2016    3:19 AM  CBC  WBC 4.0 - 10.5 K/uL  11.6  12.2   Hemoglobin 13.0 - 17.7 g/dL 16.0  15.6  14.0   Hematocrit 37.5 - 51.0 % 46.0  43.8  41.9   Platelets 150 - 400 K/uL  278  232        Latest Ref Rng & Units 08/21/2021    8:40 AM 06/04/2021    8:36 AM 05/11/2021    4:45 PM  CMP  Glucose 70 - 99 mg/dL 170  106  119   BUN 6 - 24 mg/dL $Remove'16  10  14   'cHysMrp$ Creatinine 0.76 - 1.27 mg/dL 1.18  1.35  1.21   Sodium 134 - 144 mmol/L 138  141  137   Potassium 3.5 - 5.2 mmol/L 4.1  4.2  2.7   Chloride 96 - 106 mmol/L 100  103  104   CO2 20 - 29 mmol/L $RemoveB'22  24  23   'pYsitOex$ Calcium 8.7 - 10.2 mg/dL 9.9  9.7  9.4     Lipid Panel     Component Value Date/Time   CHOL 169 07/01/2021 0809   TRIG 387 (H) 07/01/2021 0809   HDL 29 (L) 07/01/2021 0809   LDLCALC 78 07/01/2021 0809   LDLDIRECT 88 07/01/2021 0809   LABVLDL 62 (H) 07/01/2021 0809    No components found for: "NTPROBNP" Recent Labs    06/04/21 0836  PROBNP 54   Recent Labs    05/11/21 1645  TSH 1.650    BMP Recent Labs    05/11/21 1645 06/04/21 0836 08/21/21 0840  NA 137 141 138  K 2.7* 4.2 4.1  CL 104 103 100  CO2 $Re'23 24 22  'tyz$ GLUCOSE 119* 106* 170*  BUN $Re'14 10 16  'xxV$ CREATININE 1.21 1.35* 1.18  CALCIUM 9.4 9.7 9.9  GFRNONAA >60  --   --     HEMOGLOBIN A1C Lab Results  Component Value Date   HGBA1C 5.2 09/19/2016   MPG 103 09/19/2016   External Labs 06/23/2021: Vitamin B12 562   380 766 7130  Hemoglobin A1C   2021-06-16    Estimated Average Glucose 123      Hemoglobin A1C 5.9   <5.7   Lipid Panel   2021-06-16    Cholesterol 177   <200  Cholesterol / HDL Ratio 5.36   0.00-4.99  HDL Cholesterol 33   >39  LDL Cholesterol (Calculation) SEE COMMENT   <130  LDL/HDL Ratio SEE COMMENT   <3.3  Non-HDL  Cholesterol 144   <130  Triglycerides 448   <150  Magnesium   2021-06-16    Magnesium 1.9   1.6-2.4  CBC With Platelet And Differential RS In-house   2021-06-16    Absolute Basophils 0.0   0.0-0.1  Absolute Eosinophils 0.2   0.0-0.6  Absolute Lymphocytes 2.3   0.9-3.6  Absolute Monocytes 0.7   0.3-1.0  Absolute Neutrophils 5.9   2.0-8.2  Basophils Automated 0.4   0.0-2.0  Eosinophils Automated 2.5   0.0-7.0  Hematocrit 43.3   37.0-47.0  Hemoglobin 14.8   13.5-18.0  Lymphocytes Automated 24.8   20.5-51.1  MCH 29.3   26.0-33.0  MCHC 34.2   32.0-36.0  MCV 85.7   80.0-100.0  Monocytes Automated 7.6   5.0-12.0  Neutrophils Automated 64.7   42.2-75.2  Platelet Count 243   140-400  RBC 5.05   4.20-5.40  RDW 43.9      WBC 9.1   4.5-11.0  Comprehensive Metabolic Panel RS In-house   2021-06-16    Albumin 4.2   3.4-5.0  Albumin/Globulin Ratio 1.4   1.1-2.5  Alkaline Phosphatase 106   25-150  ALT (SGPT) 50   <6-78  AST (SGOT) 34   0-40  Bilirubin, Total 0.5   0.2-1.0  BUN 14   7-18  BUN/Creatinine Ratio 10.8   11.0-26.0  Calcium 9.3   8.5-10.1  Chloride 103   98-107  CO2 30   21-32  Creatinine 1.30   0.70-1.30  GFR/Black 74   >59  GFR/White 64   >59  Globulin, Calculated 3.1   1.5-4.6  Glucose 107   74-106  Potassium 4.2   3.5-5.1  Protein 7.3   6.4-8.2  Sodium 144   136-145   External Labs: Collected: 03/18/2021 A1c 6.8  Total cholesterol 182, triglycerides 233, HDL 32, non-HDL 150, LDL 103. Hemoglobin 14.7, hematocrit 43.3% Sodium 140, potassium 3.7, chloride 103, bicarb 27, BUN 9, creatinine 1.4. AST 32, ALT 59, alkaline phosphatase 113. eGFR 59  IMPRESSION:    ICD-10-CM   1. Atherosclerosis of native coronary artery of native heart without angina  pectoris  I25.10 metoprolol succinate (TOPROL XL) 50 MG 24 hr tablet    Lipid Panel With LDL/HDL Ratio    LDL cholesterol, direct    CMP14+EGFR    2. Agatston coronary artery calcium score less than 100  R93.1 metoprolol succinate (TOPROL XL) 50 MG 24 hr tablet    3. Chronic combined systolic and diastolic heart failure (HCC)  I50.42 EKG 12-Lead    dapagliflozin propanediol (FARXIGA) 10 MG TABS tablet    4. Essential hypertension  I10     5. Dyslipidemia  E78.5 Lipid Panel With LDL/HDL Ratio    LDL cholesterol, direct    CMP14+EGFR    6. Hypertriglyceridemia  E78.1     7. Non-insulin dependent type 2 diabetes mellitus (HCC)  E11.9 dapagliflozin propanediol (FARXIGA) 10 MG TABS tablet    8. Former smoker  Z87.891     38. Family history of premature CAD  Z82.49        RECOMMENDATIONS: Brandon Hogan is a 51 y.o. Caucasian male whose past medical history and cardiac risk factors include: Non-obstructive CAD per CCTA, mild CAC (32.2AU, 81st percentile), chronic combined systolic and diastolic heart failure, hypertension, hyperlipidemia, family history of premature CAD, type 2 diabetes, former smoker, history of excessive alcohol use.   Atherosclerosis of native coronary artery of native heart without angina pectoris Coronary CTA: Total CAC 32.3, 81st percentile, nonobstructive CAD. Denies  angina pectoris. No use of sublingual nitroglycerin tablets since last office visit. No hemodynamically significant stenosis based on CT FFR results but indeterminate lesion in the LCx. We discussed both invasive angiography to further evaluate for obstructive disease given his risk factors and strong family history of premature CAD.  However, since he became asymptomatic with up titration of antianginal therapy and GDMT he would like to consider continue with conservative management until unless change in clinical status. Patient is informed to keep a low threshold with regards to going to the  ED for evaluation of anginal discomfort.  He verbalizes understanding EKG today illustrates sinus rhythm without underlying injury pattern. Since last office visit he has been on Vascepa. Will check fasting lipid profile.  Chronic combined systolic and diastolic heart failure (HCC) Stage B, NYHA class II Echo 05/2021: LVEF 45-50%, global hypokinesis. Mild coronary artery calcification as per the coronary CTA. History of significant alcohol consumption in the past. Has not been able to fill his metoprolol succinate or Farxiga at his current pharmacy.  Prescription sent to a different pharmacy. Euvolemic on physical examination Patient is asked to keep a log of his blood pressures and to call the office if his systolic blood pressures are consistently greater than 130 mmHg. Re emphasized the importance of a low-salt diet  Essential hypertension Office blood pressures are not well-controlled. Home blood pressures are not checked. Will resend the prescription for Farxiga and metoprolol to a different pharmacy. If further up titration is required it would increase Entresto to 97/103 mg p.o. twice daily. Patient has establish care with Dr. Brett Fairy but is still pending sleep study.  I have asked him to reach out to Dr. Edwena Felty office to have this arranged.  Dyslipidemia Currently on atorvastatin and Vascepa. Will recheck fasting lipid profile. Further recommendations to follow  Hypertriglyceridemia Has been on Vascepa for greater than 6 weeks. No side effects or intolerances reported. Will recheck fasting lipid profile to evaluate LFTs and TG.  Non-insulin dependent type 2 diabetes mellitus (HCC) Currently on metformin, Entresto, atorvastatin. Will refill Farxiga    FINAL MEDICATION LIST END OF ENCOUNTER: Meds ordered this encounter  Medications   metoprolol succinate (TOPROL XL) 50 MG 24 hr tablet    Sig: Take 1 tablet (50 mg total) by mouth daily. Take with or immediately  following a meal.    Dispense:  30 tablet    Refill:  0   dapagliflozin propanediol (FARXIGA) 10 MG TABS tablet    Sig: Take 1 tablet (10 mg total) by mouth daily before breakfast.    Dispense:  90 tablet    Refill:  0    Medications Discontinued During This Encounter  Medication Reason   dapagliflozin propanediol (FARXIGA) 10 MG TABS tablet Reorder     Current Outpatient Medications:    allopurinol (ZYLOPRIM) 100 MG tablet, Take 100 mg by mouth 2 (two) times daily., Disp: , Rfl:    atorvastatin (LIPITOR) 40 MG tablet, TAKE 1 TABLET(40 MG) BY MOUTH DAILY, Disp: 90 tablet, Rfl: 0   ENTRESTO 49-51 MG, TAKE 1 TABLET BY MOUTH TWICE DAILY, Disp: 60 tablet, Rfl: 3   metFORMIN (GLUCOPHAGE-XR) 500 MG 24 hr tablet, Take 500 mg by mouth every morning., Disp: , Rfl: 4   metoprolol succinate (TOPROL XL) 50 MG 24 hr tablet, Take 1 tablet (50 mg total) by mouth daily. Take with or immediately following a meal., Disp: 30 tablet, Rfl: 0   Multiple Vitamins-Minerals (MULTI-VITAMIN GUMMIES) CHEW, Chew 1 tablet by  mouth daily., Disp: , Rfl:    spironolactone (ALDACTONE) 25 MG tablet, TAKE 1 TABLET(25 MG) BY MOUTH EVERY MORNING, Disp: 90 tablet, Rfl: 0   VASCEPA 1 g capsule, Take 2 capsules (2 g total) by mouth 2 (two) times daily., Disp: 120 capsule, Rfl: 2   dapagliflozin propanediol (FARXIGA) 10 MG TABS tablet, Take 1 tablet (10 mg total) by mouth daily before breakfast., Disp: 90 tablet, Rfl: 0   nitroGLYCERIN (NITROSTAT) 0.4 MG SL tablet, Place 1 tablet (0.4 mg total) under the tongue every 5 (five) minutes as needed for chest pain. If you require more than two tablets five minutes apart go to the nearest ER via EMS., Disp: 30 tablet, Rfl: 0  Orders Placed This Encounter  Procedures   Lipid Panel With LDL/HDL Ratio   LDL cholesterol, direct   CMP14+EGFR   EKG 12-Lead    There are no Patient Instructions on file for this visit.   --Continue cardiac medications as reconciled in final medication  list. --Return in about 3 months (around 02/13/2022) for Follow up, CAD,HFrEF. Or sooner if needed. --Continue follow-up with your primary care physician regarding the management of your other chronic comorbid conditions.  Patient's questions and concerns were addressed to his satisfaction. He voices understanding of the instructions provided during this encounter.   This note was created using a voice recognition software as a result there may be grammatical errors inadvertently enclosed that do not reflect the nature of this encounter. Every attempt is made to correct such errors.  Rex Kras, [place], [facility]  Pager: [phone] Office: [phone]

## 2021-11-19 NOTE — Telephone Encounter (Signed)
NPSG auth#[id number] 8/16-9/15/2023.  Patient is scheduled at [facility] for 12/15/21 at 9 pm.  Mailed packet to the patient.  

## 2021-11-19 NOTE — Telephone Encounter (Signed)
NPSG auth#[id number] 8/16-9/15/2023.  Patient is scheduled at [facility] for 12/15/21 at 9 pm.  Mailed packet to the patient.

## 2021-11-26 ENCOUNTER — Other Ambulatory Visit: Payer: Self-pay | Admitting: Cardiology

## 2021-11-26 DIAGNOSIS — R072 Precordial pain: Secondary | ICD-10-CM

## 2021-11-26 DIAGNOSIS — E119 Type 2 diabetes mellitus without complications: Secondary | ICD-10-CM

## 2021-11-26 DIAGNOSIS — I2510 Atherosclerotic heart disease of native coronary artery without angina pectoris: Secondary | ICD-10-CM

## 2021-11-26 DIAGNOSIS — Z8249 Family history of ischemic heart disease and other diseases of the circulatory system: Secondary | ICD-10-CM

## 2021-11-26 DIAGNOSIS — R931 Abnormal findings on diagnostic imaging of heart and coronary circulation: Secondary | ICD-10-CM

## 2021-12-15 ENCOUNTER — Ambulatory Visit (INDEPENDENT_AMBULATORY_CARE_PROVIDER_SITE_OTHER): Payer: BC Managed Care – PPO | Admitting: Neurology

## 2021-12-15 DIAGNOSIS — G4733 Obstructive sleep apnea (adult) (pediatric): Secondary | ICD-10-CM | POA: Diagnosis not present

## 2021-12-15 DIAGNOSIS — R0683 Snoring: Secondary | ICD-10-CM

## 2021-12-15 DIAGNOSIS — R61 Generalized hyperhidrosis: Secondary | ICD-10-CM

## 2021-12-15 DIAGNOSIS — I428 Other cardiomyopathies: Secondary | ICD-10-CM

## 2021-12-15 DIAGNOSIS — G4701 Insomnia due to medical condition: Secondary | ICD-10-CM

## 2021-12-15 DIAGNOSIS — G8929 Other chronic pain: Secondary | ICD-10-CM

## 2021-12-15 DIAGNOSIS — I10 Essential (primary) hypertension: Secondary | ICD-10-CM

## 2021-12-15 DIAGNOSIS — R002 Palpitations: Secondary | ICD-10-CM

## 2021-12-18 ENCOUNTER — Other Ambulatory Visit: Payer: Self-pay

## 2021-12-18 DIAGNOSIS — I5042 Chronic combined systolic (congestive) and diastolic (congestive) heart failure: Secondary | ICD-10-CM

## 2021-12-18 DIAGNOSIS — Z8249 Family history of ischemic heart disease and other diseases of the circulatory system: Secondary | ICD-10-CM

## 2021-12-18 DIAGNOSIS — I2510 Atherosclerotic heart disease of native coronary artery without angina pectoris: Secondary | ICD-10-CM

## 2021-12-18 DIAGNOSIS — E119 Type 2 diabetes mellitus without complications: Secondary | ICD-10-CM

## 2021-12-18 DIAGNOSIS — R931 Abnormal findings on diagnostic imaging of heart and coronary circulation: Secondary | ICD-10-CM

## 2021-12-18 DIAGNOSIS — R072 Precordial pain: Secondary | ICD-10-CM

## 2021-12-18 MED ORDER — VASCEPA 1 G PO CAPS: 2.0000 g | ORAL_CAPSULE | Freq: Two times a day (BID) | ORAL | 1 refills | Status: DC

## 2021-12-18 MED ORDER — ENTRESTO 49-51 MG PO TABS: 1.0000 | ORAL_TABLET | Freq: Two times a day (BID) | ORAL | 1 refills | Status: DC

## 2021-12-23 ENCOUNTER — Other Ambulatory Visit: Payer: Self-pay | Admitting: Cardiology

## 2021-12-23 DIAGNOSIS — E785 Hyperlipidemia, unspecified: Secondary | ICD-10-CM

## 2021-12-23 DIAGNOSIS — I2510 Atherosclerotic heart disease of native coronary artery without angina pectoris: Secondary | ICD-10-CM

## 2021-12-27 NOTE — Procedures (Signed)
[facility] POLYSOMNOGRAPHY  INTERPRETATION REPORT   STUDY DATE:  12/15/2021     PATIENT NAME:  ALONZA KNISLEY         DATE OF BIRTH:  October 14, 1970  PATIENT ID:  [id number]    TYPE OF STUDY:  PSG  READING PHYSICIAN: Larey Seat, MD REFERRED BY: Cardiologist, Dr Terri Skains SCORING TECHNICIAN: Forde Radon, RPSGT   HISTORY:  NAPOLEON MONACELLI  is a 51 year-old Male patient who was seen on 10-20-2921. He reports loud , excessive snoring, and has chronic pain, further CHF ; he is reporting palpitations and SOB. Family medical history positive for early cardiac death in his mother. ADDITIONAL INFORMATION:  The Epworth Sleepiness Scale endorsed at at  12 /24 points (scores above or equal to 10 are suggestive of hypersomnolence). FSS endorsed at  52 /63 points.  Height: 74 in Weight: 225 lb (BMI 28) Neck Size: 18 in  MEDICATIONS: Zyloprim, Lipitor, Farxiga, Entresto, Glucophage-ER, Multivitamin, Aldactone, Nitrostat TECHNICAL DESCRIPTION: A registered sleep technologist (RPSGT)  was in attendance for the duration of the recording.  Data collection, scoring, video monitoring, and reporting were performed in compliance with the AASM Manual for the Scoring of Sleep and Associated Events; (Hypopnea is scored based on the criteria listed in Section VIII D. 1b in the AASM Manual V2.6 using a 4% oxygen desaturation rule or Hypopnea is scored based on the criteria listed in Section VIII D. 1a in the AASM Manual V2.6 using 3% oxygen desaturation and /or arousal rule).   SLEEP CONTINUITY AND SLEEP ARCHITECTURE:  Lights-out was at 21:36: and lights-on at  05:06:, with  7.5 hours of recording time (450 minutes). Total sleep time (TST) was 374.5 minutes with a decreased sleep efficiency at 83.3%. Sleep latency was normal at 28.5 minutes.  REM sleep latency was increased at 135.0 minutes. Of the total sleep time, the percentage of stage N1 sleep was 2.8%, stage N2 sleep was  69%, stage N3 sleep was 12.1%, and REM sleep was 15.6%. Wake after sleep onset (WASO) time accounted for 63 minutes.   BODY POSITION:  TST was divided  between the following sleep positions: supine 38 minutes (10%), non-supine 336 minutes (90%); ( right 162 minutes (43%), left 174 minutes (46%), and prone 00 minutes (0%).  Total supine REM sleep time was 00 minutes (0% of total REM sleep). Sleep latency was normal at 28.5 minutes.  REM sleep latency was increased at 135.0 minutes.  There were 3 Stage R periods observed on this study night, 22 awakenings (i.e. transitions to Stage W from any sleep stage), and 65 total stage transitions.   RESPIRATORY MONITORING:  Based on AASM criteria (using a 3% oxygen desaturation and /or arousal rule for scoring hypopneas), there were 26 apneas (5 obstructive; 21 central; 0 mixed), and 87 hypopneas. There were 0 respiratory effort-related arousals (RERAs) scored.  Total Apnea index was 4.2/h. Total Hypopnea index was 13.9/h. The total apnea-hypopnea index (AHI) was 18.1/h. AHI was 42.0/h in supine sleep and 14/h in non-supine sleep. AHI was 14.4/h in REM. (there was no supine REM recorded) NREM AHI was 18.8/h.  OXIMETRY: Oxyhemoglobin Saturation Nadir during sleep was at  76% from a mean of 92%.  Of the Total sleep time (TST), hypoxemia (<90%) was present for 12.4 minutes, or 3.3% of total sleep time.  LIMB MOVEMENTS: There were 71 periodic limb movements of sleep (11.4/hr), of which 0 (0.0/hr) were associated with an arousal. AROUSAL:  17 were identified  as respiratory-related arousals (3 /h), 0 were PLM-related arousals (0 /h), and 37 were non-specific arousals (6 /h). There were 0 occurrences of Cheyne Stokes breathing.  Snoring was classified as mild . EEG:  PSG EEG was of normal amplitude and frequency, with symmetric manifestation of sleep stages. EKG: The electrocardiogram documented NSR with well-formed p waves.   The average heart rate during sleep was 69  bpm.  The heart rate during sleep varied between 63 bpm and  a maximum HR of  79 bpm. AUDIO and VIDEO:  unremarkable - no vocalization or complex movements during sleep.  IMPRESSION: 1) Sleep disordered breathing was present. Obstructive Sleep apnea was seen at a mild to moderate and associated with hypoxemia at oxygen nadir of 76%.  NREM sleep in supine sleep both increased the overall AHI, REM sleep did not     2) Periodic limb movements were present without leading to arousals.  3)Total sleep time was within normal limits at 374.5 minutes. Sleep efficiency was decreased at 83.3%.      RECOMMENDATIONS: 1) Positive airway pressure therapy is recommended and an auto titration device was ordered by the interpreting physician : AUTO PAP 5-16 cm water, 2 cm EPR and mask of choice and comfort, heated humidification.  2) Supine sleep position should be avoided, ( DME :please consider when fitting for a mask/ interface).  3) Weight loss can lead to improvement in sleep-disordered breathing. Alcohol and sedating medications at bedtime can exacerbate sleep-disordered breathing.   Melvyn Novas,  MD

## 2021-12-27 NOTE — Addendum Note (Signed)
Addended by: Larey Seat on: 12/27/2021 04:04 PM   Modules accepted: Orders

## 2021-12-29 ENCOUNTER — Telehealth: Payer: Self-pay | Admitting: Neurology

## 2021-12-29 NOTE — Telephone Encounter (Signed)
-----   Message from Larey Seat, MD sent at 12/27/2021  4:04 PM EDT ----- IMPRESSION: 1) Sleep disordered breathing was present. Obstructive Sleep apnea was seen at a mild to moderate and associated with hypoxemia at oxygen nadirof 76%. NREM sleepin supine sleep both increased the overall AHI, REM sleep did not 2) Periodic limb movements were present without leading to arousals.  3)Total sleep time was within normal limitsat 374.62minutes.Sleep efficiency was decreasedat 83.3%.   RECOMMENDATIONS: 1) Positive airway pressure therapy isrecommended and an auto titration device was ordered by the interpreting physician : AUTO PAP 5-16 cm water, 2 cm EPR and mask of choice and comfort, heated humidification.  2) Supine sleep position should be avoided, ( DME :please consider when fitting for a mask/ interface).  3) Weight loss can lead to improvement in sleep-disordered breathing. Alcohol and sedating medications at bedtime can exacerbate sleep-disordered breathing.   CarmenDohmeier, MD

## 2021-12-29 NOTE — Telephone Encounter (Signed)
I called pt. I advised pt that Dr. Brett Fairy  reviewed their sleep study results and found that pt has moderate sleep apnea. Dr. Brett Fairy recommends that pt starts auto CPAP. I reviewed PAP compliance expectations with the pt. Pt is agreeable to starting a CPAP. I advised pt that an order will be sent to a DME, [facility], and [facility] will call the pt within about one week after they file with the pt's insurance. [facility] will show the pt how to use the machine, fit for masks, and troubleshoot the CPAP if needed. A follow up appt was made for insurance purposes with Ward Givens, NP on 03/16/2022 at 9:30 am. Pt verbalized understanding to arrive 15 minutes early and bring their CPAP. A letter with all of this information in it will be mailed to the pt as a reminder. I verified with the pt that the address we have on file is correct. Pt verbalized understanding of results. Pt had no questions at this time but was encouraged to call back if questions arise. I have sent the order to [facility] and have received confirmation that they have received the order.

## 2022-02-04 ENCOUNTER — Other Ambulatory Visit: Payer: Self-pay | Admitting: Cardiology

## 2022-02-04 DIAGNOSIS — R072 Precordial pain: Secondary | ICD-10-CM

## 2022-02-04 DIAGNOSIS — I10 Essential (primary) hypertension: Secondary | ICD-10-CM

## 2022-02-08 ENCOUNTER — Other Ambulatory Visit: Payer: Self-pay | Admitting: Cardiology

## 2022-02-08 DIAGNOSIS — R931 Abnormal findings on diagnostic imaging of heart and coronary circulation: Secondary | ICD-10-CM

## 2022-02-08 DIAGNOSIS — I2510 Atherosclerotic heart disease of native coronary artery without angina pectoris: Secondary | ICD-10-CM

## 2022-02-12 ENCOUNTER — Other Ambulatory Visit: Payer: Self-pay | Admitting: Cardiology

## 2022-02-12 DIAGNOSIS — E119 Type 2 diabetes mellitus without complications: Secondary | ICD-10-CM

## 2022-02-12 DIAGNOSIS — I5042 Chronic combined systolic (congestive) and diastolic (congestive) heart failure: Secondary | ICD-10-CM

## 2022-02-13 ENCOUNTER — Ambulatory Visit: Payer: BC Managed Care – PPO | Admitting: Cardiology

## 2022-03-12 NOTE — Progress Notes (Signed)
PATIENT: BELMONT VALLI DOB: 1970/04/26  REASON FOR VISIT: follow up HISTORY FROM: patient PRIMARY NEUROLOGIST: Dr. Vickey Huger  Chief Complaint  Patient presents with   Follow-up    Rm 19, alone.     HISTORY OF PRESENT ILLNESS: Today 03/16/22:  Mr. Brandon Hogan is a 51 year old male with a history of obstructive sleep apnea on CPAP.  He returns today for follow-up.  His download is below.  This is his first time using the CPAP machine.  Reports that he has noticed come benefit.  Denies any significant daytime sleepiness or fatigue.  Report is below    REVIEW OF SYSTEMS: Out of a complete 14 system review of symptoms, the patient complains only of the following symptoms, and all other reviewed systems are negative.  ESS 7  ALLERGIES: No Known Allergies  HOME MEDICATIONS: Outpatient Medications Prior to Visit  Medication Sig Dispense Refill   allopurinol (ZYLOPRIM) 100 MG tablet Take 100 mg by mouth 2 (two) times daily.     atorvastatin (LIPITOR) 40 MG tablet TAKE 1 TABLET(40 MG) BY MOUTH DAILY 90 tablet 0   FARXIGA 10 MG TABS tablet TAKE 1 TABLET(10 MG) BY MOUTH DAILY BEFORE BREAKFAST 90 tablet 0   metFORMIN (GLUCOPHAGE-XR) 500 MG 24 hr tablet Take 500 mg by mouth every morning.  4   metoprolol succinate (TOPROL-XL) 50 MG 24 hr tablet TAKE 1 TABLET(50 MG) BY MOUTH DAILY WITH OR IMMEDIATELY FOLLOWING A MEAL 90 tablet 0   Multiple Vitamins-Minerals (MULTI-VITAMIN GUMMIES) CHEW Chew 1 tablet by mouth daily.     nitroGLYCERIN (NITROSTAT) 0.4 MG SL tablet Place 1 tablet (0.4 mg total) under the tongue every 5 (five) minutes as needed for chest pain. If you require more than two tablets five minutes apart go to the nearest ER via EMS. 30 tablet 0   sacubitril-valsartan (ENTRESTO) 49-51 MG Take 1 tablet by mouth 2 (two) times daily. 180 tablet 1   spironolactone (ALDACTONE) 25 MG tablet TAKE 1 TABLET(25 MG) BY MOUTH EVERY MORNING 90 tablet 0   VASCEPA 1 g capsule Take 2 capsules  (2 g total) by mouth 2 (two) times daily. 360 capsule 1   No facility-administered medications prior to visit.    PAST MEDICAL HISTORY: Past Medical History:  Diagnosis Date   Cardiomyopathy (HCC)    Diabetes mellitus without complication (HCC)    Gallstone    Hyperlipidemia    Hypertension     PAST SURGICAL HISTORY: Past Surgical History:  Procedure Laterality Date   CHOLECYSTECTOMY N/A 09/18/2016   Procedure: LAPAROSCOPIC CHOLECYSTECTOMY;  Surgeon: Almond Lint, MD;  Location: [facility] OR;  Service: General;  Laterality: N/A;   CHOLECYSTECTOMY  2018   ERCP N/A 09/18/2016   Procedure: ENDOSCOPIC RETROGRADE CHOLANGIOPANCREATOGRAPHY (ERCP);  Surgeon: Iva Boop, MD;  Location: [facility] OR;  Service: Endoscopy;  Laterality: N/A;    FAMILY HISTORY: Family History  Problem Relation Age of Onset   Heart attack Mother    Gallbladder disease Father    Heart attack Father 57   Heart attack Brother 36    SOCIAL HISTORY: Social History   Socioeconomic History   Marital status: Married    Spouse name: Misty Stanley   Number of children: 2   Years of education: Not on file   Highest education level: High school graduate  Occupational History   Not on file  Tobacco Use   Smoking status: Former    Packs/day: 2.00    Years: 25.00    Total pack  years: 50.00    Types: Cigarettes    Quit date: 05/17/2011    Years since quitting: 10.8   Smokeless tobacco: Never  Vaping Use   Vaping Use: Former   Quit date: 05/16/2016  Substance and Sexual Activity   Alcohol use: Yes    Comment: rare   Drug use: No   Sexual activity: Not on file  Other Topics Concern   Not on file  Social History Narrative   Lives at home with wife and 1 daughter   R handed   Caffeine: 3-4 soft drinks a day   Social Determinants of Health   Financial Resource Strain: Not on file  Food Insecurity: Not on file  Transportation Needs: Not on file  Physical Activity: Not on file  Stress: Not on file  Social  Connections: Not on file  Intimate Partner Violence: Not on file      PHYSICAL EXAM  Vitals:   03/16/22 0908  BP: 137/88  Pulse: 81  Weight: 221 lb 12.8 oz (100.6 kg)  Height: 6\' 2"  (1.88 m)   Body mass index is 28.48 kg/m.  Generalized: Well developed, in no acute distress  Chest: Lungs clear to auscultation bilaterally  Neurological examination  Mentation: Alert oriented to time, place, history taking. Follows all commands speech and language fluent Cranial nerve II-XII: Extraocular movements were full, visual field were full on confrontational test Head turning and shoulder shrug  were normal and symmetric. Gait and station: Gait is normal.    DIAGNOSTIC DATA (LABS, IMAGING, TESTING) - I reviewed patient records, labs, notes, testing and imaging myself where available.  Lab Results  Component Value Date   WBC 11.6 (H) 05/11/2021   HGB 16.0 06/04/2021   HCT 46.0 06/04/2021   MCV 85.0 05/11/2021   PLT 278 05/11/2021      Component Value Date/Time   NA 138 08/21/2021 0840   K 4.1 08/21/2021 0840   CL 100 08/21/2021 0840   CO2 22 08/21/2021 0840   GLUCOSE 170 (H) 08/21/2021 0840   GLUCOSE 119 (H) 05/11/2021 1645   BUN 16 08/21/2021 0840   CREATININE 1.18 08/21/2021 0840   CALCIUM 9.9 08/21/2021 0840   PROT 6.5 09/20/2016 0319   ALBUMIN 3.1 (L) 09/20/2016 0319   AST 52 (H) 09/20/2016 0319   ALT 167 (H) 09/20/2016 0319   ALKPHOS 150 (H) 09/20/2016 0319   BILITOT 1.4 (H) 09/20/2016 0319   GFRNONAA >60 05/11/2021 1645   GFRAA 79 06/29/2018 1049   Lab Results  Component Value Date   CHOL 169 07/01/2021   HDL 29 (L) 07/01/2021   LDLCALC 78 07/01/2021   LDLDIRECT 88 07/01/2021   TRIG 387 (H) 07/01/2021   Lab Results  Component Value Date   HGBA1C 5.2 09/19/2016   No results found for: "VITAMINB12" Lab Results  Component Value Date   TSH 1.650 05/11/2021      ASSESSMENT AND PLAN 51 y.o. year old male  has a past medical history of Cardiomyopathy  (HCC), Diabetes mellitus without complication (HCC), Gallstone, Hyperlipidemia, and Hypertension. here with:  OSA on CPAP  - CPAP compliance excellent - Good treatment of AHI  - Encourage patient to use CPAP nightly and > 4 hours each night - F/U in 1 year or sooner if needed  54, MSN, NP-C 03/16/2022, 9:07 AM [facility] [address] [place], [place] [id number]

## 2022-03-16 ENCOUNTER — Encounter: Payer: Self-pay | Admitting: Adult Health

## 2022-03-16 ENCOUNTER — Ambulatory Visit (INDEPENDENT_AMBULATORY_CARE_PROVIDER_SITE_OTHER): Payer: BC Managed Care – PPO | Admitting: Adult Health

## 2022-03-16 VITALS — BP 137/88 | HR 81 | Ht 74.0 in | Wt 221.8 lb

## 2022-03-16 DIAGNOSIS — G4733 Obstructive sleep apnea (adult) (pediatric): Secondary | ICD-10-CM

## 2022-03-16 NOTE — Patient Instructions (Signed)
Continue using CPAP nightly and greater than 4 hours each night °If your symptoms worsen or you develop new symptoms please let us know.  ° °

## 2022-03-19 ENCOUNTER — Other Ambulatory Visit: Payer: Self-pay | Admitting: Cardiology

## 2022-03-19 DIAGNOSIS — E785 Hyperlipidemia, unspecified: Secondary | ICD-10-CM

## 2022-03-19 DIAGNOSIS — I2510 Atherosclerotic heart disease of native coronary artery without angina pectoris: Secondary | ICD-10-CM

## 2022-04-08 IMAGING — CT CT HEART MORP W/ CTA COR W/ SCORE W/ CA W/CM &/OR W/O CM
4 of 7 series · 8 of 20 positions shown, 9 images · IV contrast (omnipaque)
Comparison: Prior calcium score study on 05/28/2021
COMPARISON: Prior calcium score study on 05/28/2021

Addendum:
EXAM:
OVER-READ INTERPRETATION  CT CHEST

The following report is an over-read performed by radiologist Dr.
Anuski Raurell [REDACTED] on 06/18/2021. This
over-read does not include interpretation of cardiac or coronary
anatomy or pathology. The coronary CTA interpretation by the
cardiologist is attached.
HISTORY: Chest pain, nonspecific
Cardiac/Coronary  CT
TECHNIQUE: The patient was scanned on a Siemens Force scanner.
PROTOCOL: A 120 kV prospective scan was triggered in the descending thoracic
aorta at 111 HU's. Axial non-contrast 3 mm slices were carried out
through the heart. The data set was analyzed on a dedicated work
station and scored using the Agatson method. Gantry rotation speed
was 250 msecs and collimation was .6 mm. Patient was given 10 mg of
IV diltiazem IVP and 0.8 mg of sl NTG was given. The 3D data set was
reconstructed in 5% intervals of the 35-75 % of the R-R cycle.
Diastolic phases were analyzed on a dedicated work station using
MPR, MIP and VRT modes. The patient received 95mL OMNIPAQUE IOHEXOL
350 MG/ML SOLN of contrast.

[Series 6: best syst · axial · 0.39mm/px · z∈[+55,+92]mm · 2 of 276 slices shown, 3 images]
[im 92/276  vessel]
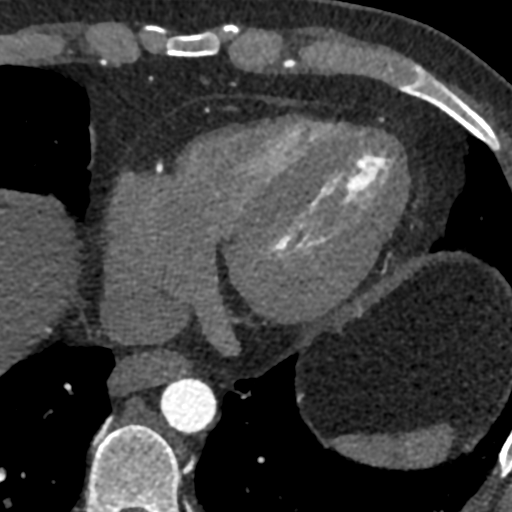
[im 92/276  lung]
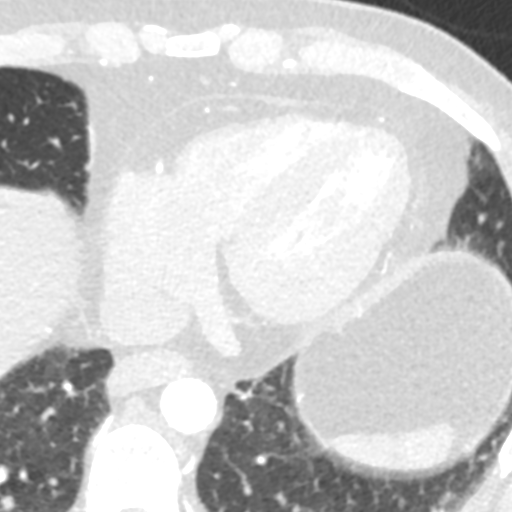
[im 184/276  vessel]
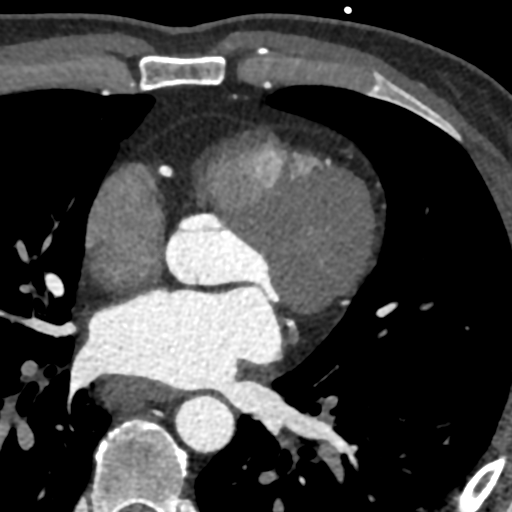

[Series 10: ts syst sharp · axial · 0.39mm/px · z∈[+55,+92]mm · 2 of 276 slices shown]
[im 92/276  lung]
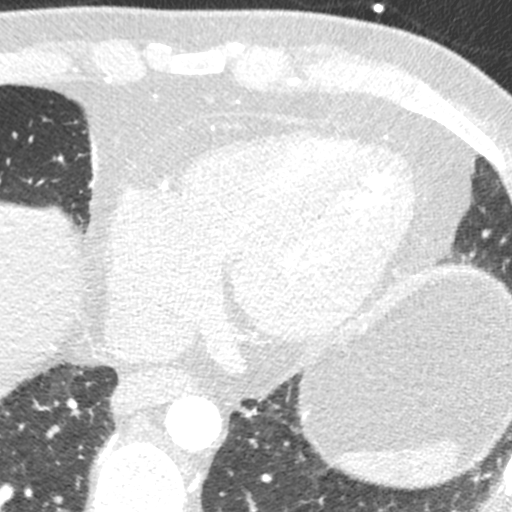
[im 184/276  lung]
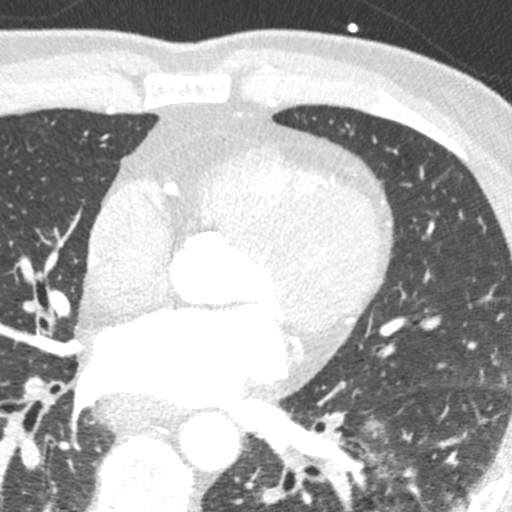

[Series 11: best diast · axial · 0.39mm/px · z∈[+55,+92]mm · 2 of 276 slices shown]
[im 92/276  vessel]
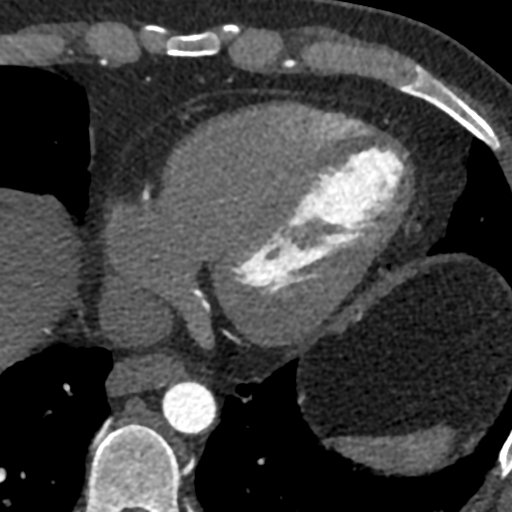
[im 184/276  vessel]
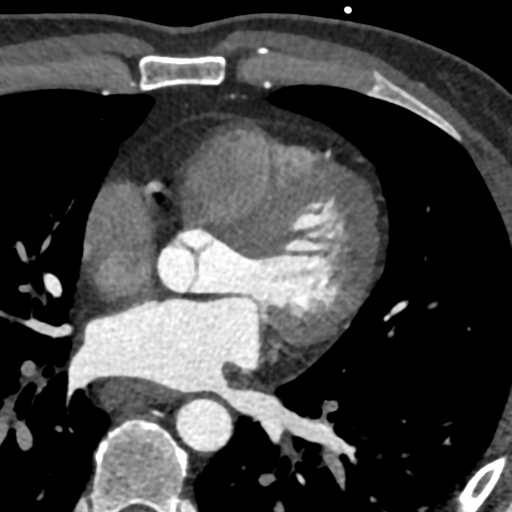

[Series 12: ts diast sharp · axial · 0.39mm/px · z∈[+55,+92]mm · 2 of 276 slices shown]
[im 92/276  lung]
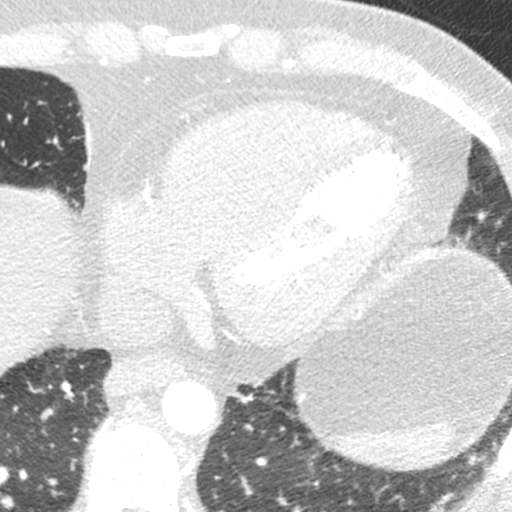
[im 184/276  lung]
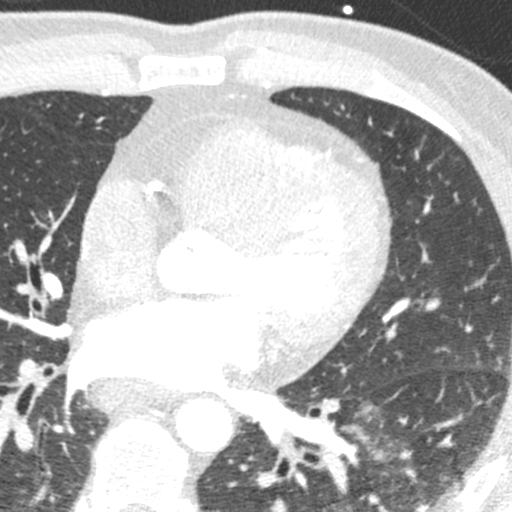

[8 of 20 positions shown; findings below may reference images not displayed]

FINDINGS: Vascular: No significant noncardiac vascular findings.

Mediastinum/Nodes: Visualized mediastinum and hilar regions
demonstrate no lymphadenopathy or masses.

Lungs/Pleura: Visualized lungs show no evidence of pulmonary edema,
consolidation, pneumothorax, nodule or pleural fluid.

Upper Abdomen: No acute abnormality.

Musculoskeletal: No chest wall mass or suspicious bone lesions
identified.
IMPRESSION: No significant incidental findings.
FINDINGS: Image quality: Average.

Artifact: Limited.

Coronary artery calcification score:

Left main: 0

Left anterior descending artery:

Left circumflex artery:

Right coronary artery:

Total coronary calcium score of 32.3, places the patient at the 81st
percentile for age and sex matched control.

Coronary arteries: Normal coronary origins.  Right dominance.

Left Main Coronary Artery: The left main is a normal caliber vessel
with a normal take off from the left coronary cusp that bifurcates
to form a left anterior descending artery and a left circumflex
artery. There is no plaque or stenosis.

Left Anterior Descending Coronary Artery: Normal caliber vessel,
wraps the apex, gives off 2 patent diagonal branches. Proximal LAD
patent, minimal mixed plaque (0-24%) within the proximal / mid
segment, mid to distal segment is patent

Left Circumflex Artery: Normal caliber vessel, non-dominant, travels
within the atrioventricular groove, gives off 1 patent obtuse
marginal branches. The LCX is patent with minimal luminal
irregularities due to calcified plaque. Mild stenosis (25-49%) due
to noncalcified plaque at mid LCX but accuracy is limited due to
artifact. Distal segment is patent.

Right Coronary Artery: The RCA is dominant with normal take off from
the right coronary cusp. The RCA terminates as a PDA and right
posterolateral branch. Proximal RCA patent. Mild stenosis (25-49%)
due to mixed plaque proximal to mid RCA. Mid/distal RCA patent with
luminal irregularities.

Left Atrium: Grossly normal in size with no left atrial appendage
filling defect.

Left Ventricle: Grossly normal in size. There are no stigmata of
prior infarction. There is no abnormal filling defect.

Pulmonary arteries: Normal in size without proximal filling defect.

Pulmonary veins: Normal pulmonary venous drainage.

Aorta: Normal size, 26 mm at the mid ascending aorta (level of the
PA bifurcation) measured double oblique. No calcifications. No
dissection.

Pericardium: Normal thickness with no significant effusion or
calcium present.

Cardiac valves: The aortic valve is trileaflet without
calcification. The mitral valve is normal structure without
calcification.

Extra-cardiac findings: See attached radiology report for
non-cardiac structures.
IMPRESSION: 1. Total coronary calcium score of 32.3. This was 81st percentile
for age and sex matched control.

2. Normal coronary origin with right dominance.

3. CAD-RADS = 2 Mild non-obstructive CAD.

Left main: Patent.

LAD: Minimal (0-24%) mixed plaque within the proximal / mid segment.

LCx: Mild stenosis (25-49%) due to noncalcified plaque at mid LCX
but accuracy is limited due to artifact.

RCA: Mild stenosis (25-49%) due to mixed plaque proximal to mid RCA.

4. Study is sent for CT-FFR to further evaluate the LCX. Findings
will be performed and reported separately.

RECOMMENDATIONS:

Consider non-atherosclerotic causes of chest pain. Consider
preventive therapy and risk factor modification.

*** End of Addendum ***
EXAM:
OVER-READ INTERPRETATION  CT CHEST

The following report is an over-read performed by radiologist Dr.
Anuski Raurell [REDACTED] on 06/18/2021. This
over-read does not include interpretation of cardiac or coronary
anatomy or pathology. The coronary CTA interpretation by the
cardiologist is attached.
FINDINGS: Vascular: No significant noncardiac vascular findings.

Mediastinum/Nodes: Visualized mediastinum and hilar regions
demonstrate no lymphadenopathy or masses.

Lungs/Pleura: Visualized lungs show no evidence of pulmonary edema,
consolidation, pneumothorax, nodule or pleural fluid.

Upper Abdomen: No acute abnormality.

Musculoskeletal: No chest wall mass or suspicious bone lesions
identified.
IMPRESSION: No significant incidental findings.

## 2022-05-05 ENCOUNTER — Other Ambulatory Visit: Payer: Self-pay | Admitting: Cardiology

## 2022-05-05 DIAGNOSIS — I2510 Atherosclerotic heart disease of native coronary artery without angina pectoris: Secondary | ICD-10-CM

## 2022-05-05 DIAGNOSIS — R931 Abnormal findings on diagnostic imaging of heart and coronary circulation: Secondary | ICD-10-CM

## 2022-05-13 ENCOUNTER — Other Ambulatory Visit: Payer: Self-pay | Admitting: Cardiology

## 2022-05-13 DIAGNOSIS — R072 Precordial pain: Secondary | ICD-10-CM

## 2022-05-13 DIAGNOSIS — I10 Essential (primary) hypertension: Secondary | ICD-10-CM

## 2022-06-11 ENCOUNTER — Other Ambulatory Visit: Payer: Self-pay | Admitting: Cardiology

## 2022-06-11 DIAGNOSIS — I5042 Chronic combined systolic (congestive) and diastolic (congestive) heart failure: Secondary | ICD-10-CM

## 2022-06-11 DIAGNOSIS — R072 Precordial pain: Secondary | ICD-10-CM

## 2022-06-11 DIAGNOSIS — I2510 Atherosclerotic heart disease of native coronary artery without angina pectoris: Secondary | ICD-10-CM

## 2022-06-11 DIAGNOSIS — R931 Abnormal findings on diagnostic imaging of heart and coronary circulation: Secondary | ICD-10-CM

## 2022-06-11 DIAGNOSIS — Z8249 Family history of ischemic heart disease and other diseases of the circulatory system: Secondary | ICD-10-CM

## 2022-06-11 DIAGNOSIS — E119 Type 2 diabetes mellitus without complications: Secondary | ICD-10-CM

## 2022-07-10 ENCOUNTER — Ambulatory Visit: Payer: BC Managed Care – PPO | Admitting: Cardiology

## 2022-07-10 ENCOUNTER — Encounter: Payer: Self-pay | Admitting: Cardiology

## 2022-07-10 VITALS — BP 137/92 | HR 91 | Resp 18 | Ht 74.0 in | Wt 229.0 lb

## 2022-07-10 DIAGNOSIS — I5042 Chronic combined systolic (congestive) and diastolic (congestive) heart failure: Secondary | ICD-10-CM

## 2022-07-10 DIAGNOSIS — E785 Hyperlipidemia, unspecified: Secondary | ICD-10-CM

## 2022-07-10 DIAGNOSIS — R931 Abnormal findings on diagnostic imaging of heart and coronary circulation: Secondary | ICD-10-CM

## 2022-07-10 DIAGNOSIS — E119 Type 2 diabetes mellitus without complications: Secondary | ICD-10-CM

## 2022-07-10 DIAGNOSIS — I25118 Atherosclerotic heart disease of native coronary artery with other forms of angina pectoris: Secondary | ICD-10-CM

## 2022-07-10 DIAGNOSIS — E781 Pure hyperglyceridemia: Secondary | ICD-10-CM

## 2022-07-10 DIAGNOSIS — Z87891 Personal history of nicotine dependence: Secondary | ICD-10-CM

## 2022-07-10 DIAGNOSIS — I10 Essential (primary) hypertension: Secondary | ICD-10-CM

## 2022-07-10 DIAGNOSIS — Z8249 Family history of ischemic heart disease and other diseases of the circulatory system: Secondary | ICD-10-CM

## 2022-07-10 MED ORDER — ENTRESTO 97-103 MG PO TABS
1.0000 | ORAL_TABLET | Freq: Two times a day (BID) | ORAL | 0 refills | Status: DC
Start: 2022-07-10 — End: 2022-08-13

## 2022-07-10 MED ORDER — FENOFIBRATE 145 MG PO TABS
145.0000 mg | ORAL_TABLET | Freq: Every day | ORAL | 0 refills | Status: DC
Start: 2022-07-10 — End: 2022-10-05

## 2022-07-10 NOTE — Progress Notes (Signed)
ID:  Brandon Hogan, DOB 1970-09-16, MRN [id number]  PCP:  [name], NP  Cardiologist:  [name], DO, [facility] (established care 05/20/2021) Former Cardiology Providers: Donnelly Stager, NP Sleep medicine provider: Dr. Vickey Huger  Date: 07/10/22 Last Office Visit: 11/13/2021  Chief Complaint  Patient presents with   Cardiomyopathy   Shortness of Breath   HPI  Brandon Hogan is a 52 y.o. Caucasian male whose past medical history and cardiovascular risk factors include: Non-obstructive CAD per CCTA, mild CAC (32.2AU, 81st percentile), chronic combined systolic and diastolic heart failure, hypertension, hyperlipidemia, family history of premature CAD, type 2 diabetes, former smoker, history of excessive alcohol use.   In February 2023 he presented to the office for evaluation of chest pain after stopping all his medications during COVID-19 pandemic for at least 1 year.  Patient was restarted on his medications and echocardiogram noted mildly reduced LVEF with global hypokinesis.  Thereafter he underwent coronary CTA which noted nonobstructive CAD with mild CAC.  However, CT FFR noted disease in the mid LCx which was in the indeterminate zone and therefore clinical correlation was required.  Clinically he did not have anginal chest pain and he wanted to hold off on invasive angiography.  However given his risk factors and strong family history of CAD I have asked him to keep a very low threshold for seeking medical attention if he has symptoms.  Unfortunately he was lost to follow-up and now presents to the office to reestablish care as he has been experiencing dyspnea on exertion.  Patient states that he is compliant with medical therapy.  Denies orthopnea or lower extremity swelling but does have PND at night.  He has been diagnosed with sleep apnea but is not able to tolerate the CPAP more than 1 hour per night.  No use of sublingual nitroglycerin tablets.  He continues to have  nonspecific precordial discomfort, lasting for few minutes, intensity 4 out of 10, dullness/pressure-like, not brought on by effort related activities, does not resolve with resting, usually self-limited.  No change in overall physical endurance/capacity.  Patient states that he just got busy with life and was unable to follow-up as originally scheduled.  He is no longer taking Vascepa for his hypertriglyceridemia as it is becoming cost prohibitive.  Family history of premature CAD with both father and brother having MI in their 58s and they both went bypass surgery.  Mom died at the age of 39 due to myocardial infarction.  About 10 years ago he used to drink 12-14 bottles of beer on a daily basis for 4-5 years.  Currently works as an Scientist, product/process development at [employer].  FUNCTIONAL STATUS: No structured exercise program or daily routine.   ALLERGIES: No Known Allergies  MEDICATION LIST PRIOR TO VISIT: Current Meds  Medication Sig   allopurinol (ZYLOPRIM) 100 MG tablet Take 100 mg by mouth 2 (two) times daily.   atorvastatin (LIPITOR) 40 MG tablet TAKE 1 TABLET(40 MG) BY MOUTH DAILY   FARXIGA 10 MG TABS tablet TAKE 1 TABLET(10 MG) BY MOUTH DAILY BEFORE BREAKFAST   fenofibrate (TRICOR) 145 MG tablet Take 1 tablet (145 mg total) by mouth daily.   LORazepam (ATIVAN) 1 MG tablet Take 1 mg by mouth every 8 (eight) hours as needed for anxiety.   metFORMIN (GLUCOPHAGE-XR) 500 MG 24 hr tablet Take 500 mg by mouth every morning.   metoprolol succinate (TOPROL-XL) 50 MG 24 hr tablet TAKE 1 TABLET(50 MG) BY MOUTH DAILY WITH OR IMMEDIATELY  FOLLOWING A MEAL   Multiple Vitamins-Minerals (MULTI-VITAMIN GUMMIES) CHEW Chew 1 tablet by mouth daily.   nitroGLYCERIN (NITROSTAT) 0.4 MG SL tablet Place 1 tablet (0.4 mg total) under the tongue every 5 (five) minutes as needed for chest pain. If you require more than two tablets five minutes apart go to the nearest ER via EMS.   sacubitril-valsartan  (ENTRESTO) 97-103 MG Take 1 tablet by mouth 2 (two) times daily.   spironolactone (ALDACTONE) 25 MG tablet TAKE 1 TABLET(25 MG) BY MOUTH EVERY MORNING   venlafaxine XR (EFFEXOR-XR) 75 MG 24 hr capsule Take 75 mg by mouth daily with breakfast.   [DISCONTINUED] ENTRESTO 49-51 MG TAKE 1 TABLET BY MOUTH TWICE DAILY     PAST MEDICAL HISTORY: Past Medical History:  Diagnosis Date   Cardiomyopathy    Diabetes mellitus without complication    Gallstone    Hyperlipidemia    Hypertension     PAST SURGICAL HISTORY: Past Surgical History:  Procedure Laterality Date   CHOLECYSTECTOMY N/A 09/18/2016   Procedure: LAPAROSCOPIC CHOLECYSTECTOMY;  Surgeon: Almond Lint, MD;  Location: [facility] OR;  Service: General;  Laterality: N/A;   CHOLECYSTECTOMY  2018   ERCP N/A 09/18/2016   Procedure: ENDOSCOPIC RETROGRADE CHOLANGIOPANCREATOGRAPHY (ERCP);  Surgeon: Iva Boop, MD;  Location: [facility] OR;  Service: Endoscopy;  Laterality: N/A;    FAMILY HISTORY: The patient family history includes Gallbladder disease in his father; Heart attack in his mother; Heart attack (age of onset: 78) in his brother and father.  SOCIAL HISTORY:  The patient  reports that he quit smoking about 11 years ago. His smoking use included cigarettes. He has a 50.00 pack-year smoking history. He has never used smokeless tobacco. He reports current alcohol use. He reports that he does not use drugs.  REVIEW OF SYSTEMS: Review of Systems  Cardiovascular:  Positive for dyspnea on exertion (chronic and stable). Negative for chest pain, claudication, irregular heartbeat, leg swelling, near-syncope, orthopnea, palpitations, paroxysmal nocturnal dyspnea and syncope.  Respiratory:  Negative for shortness of breath.   Hematologic/Lymphatic: Negative for bleeding problem.  Musculoskeletal:  Negative for muscle cramps and myalgias.  Neurological:  Negative for dizziness and light-headedness.    PHYSICAL EXAM:    07/10/2022    3:07 PM  03/16/2022    9:08 AM 11/13/2021    3:19 PM  Vitals with BMI  Height 6\' 2"  6\' 2"    Weight 229 lbs 221 lbs 13 oz   BMI 29.39 28.47   Systolic 137 137 161  Diastolic 92 88 86  Pulse 91 81 88   Physical Exam  Constitutional: No distress.  Age appropriate, hemodynamically stable.   Neck: No JVD present.  Cardiovascular: Normal rate, regular rhythm, S1 normal, S2 normal, intact distal pulses and normal pulses. Exam reveals no gallop, no S3 and no S4.  No murmur heard. Pulses:      Dorsalis pedis pulses are 2+ on the right side and 2+ on the left side.       Posterior tibial pulses are 2+ on the right side and 2+ on the left side.  Pulmonary/Chest: Effort normal and breath sounds normal. No stridor. He has no wheezes. He has no rales.  Abdominal: Soft. Bowel sounds are normal. He exhibits no distension. There is no abdominal tenderness.  Musculoskeletal:        General: No edema.     Cervical back: Neck supple.  Neurological: He is alert and oriented to person, place, and time. He has intact cranial  nerves (2-12).  Skin: Skin is warm and moist.   CARDIAC DATABASE: EKG: 07/10/2022: Normal sinus rhythm, 87 bpm, normal axis, without underlying ischemia or injury pattern.  Echocardiogram: 05/22/2021:  Left ventricle cavity is normal in size. Normal left ventricular wall thickness. Hypokinetic global wall motion. Normal diastolic filling pattern. Mildly depressed LV systolic function with visual EF 45-50%. Left atrial cavity is mildly dilated. Compared to 08/19/2018, LVEF previously reported at 50 to 55%, however on 08/11/2016, EF was reported to be 40 to 45%. Overall no significant change.  Stress Testing: No results found for this or any previous visit from the past 1095 days.   Heart Catheterization: None  CCTA 06/18/2021: 1. Total coronary calcium score of 32.3. This was 81st percentile for age and sex matched control.   2. Normal coronary origin with right dominance.   3.  CAD-RADS = 2 Mild non-obstructive CAD. Left main: Patent. LAD: Minimal (0-24%) mixed plaque within the proximal / mid segment. LCx: Mild stenosis (25-49%) due to noncalcified plaque at mid LCX but accuracy is limited due to artifact. RCA: Mild stenosis (25-49%) due to mixed plaque proximal to mid RCA. 4. Study is sent for CT-FFR to further evaluate the LCX. Findings will be performed and reported separately. 5. No significant incidental findings.  CT-FFR Impression:  1. CT FFR analysis showed no significant stenosis in Left main, LAD, and RCA.  2. Possible hemodynamically significant stenosis within the mid-distal LCx, CT-FFR valves are within the indeterminate zone, clinical correlation required.   Recommendations: Goal directed and uptitration of anti-anginal therapy.  Aggressive risk factor modification for secondary prevention of coronary artery disease.  If symptoms persist despite uptitration of medical therapy consider invasive angiography to further evaluate the LCX.   LABORATORY DATA:    Latest Ref Rng & Units 06/04/2021    8:38 AM 05/11/2021    4:45 PM 09/20/2016    3:19 AM  CBC  WBC 4.0 - 10.5 K/uL  11.6  12.2   Hemoglobin 13.0 - 17.7 g/dL 16.1  09.6  04.5   Hematocrit 37.5 - 51.0 % 46.0  43.8  41.9   Platelets 150 - 400 K/uL  278  232        Latest Ref Rng & Units 08/21/2021    8:40 AM 06/04/2021    8:36 AM 05/11/2021    4:45 PM  CMP  Glucose 70 - 99 mg/dL 409  811  914   BUN 6 - 24 mg/dL 16  10  14    Creatinine 0.76 - 1.27 mg/dL 7.82  9.56  2.13   Sodium 134 - 144 mmol/L 138  141  137   Potassium 3.5 - 5.2 mmol/L 4.1  4.2  2.7   Chloride 96 - 106 mmol/L 100  103  104   CO2 20 - 29 mmol/L 22  24  23    Calcium 8.7 - 10.2 mg/dL 9.9  9.7  9.4     Lipid Panel     Component Value Date/Time   CHOL 169 07/01/2021 0809   TRIG 387 (H) 07/01/2021 0809   HDL 29 (L) 07/01/2021 0809   LDLCALC 78 07/01/2021 0809   LDLDIRECT 88 07/01/2021 0809   LABVLDL 62 (H) 07/01/2021 0809     No components found for: "NTPROBNP" No results for input(s): "PROBNP" in the last 8760 hours.  No results for input(s): "TSH" in the last 8760 hours.   BMP Recent Labs    08/21/21 0840  NA 138  K 4.1  CL 100  CO2 22  GLUCOSE 170*  BUN 16  CREATININE 1.18  CALCIUM 9.9    HEMOGLOBIN A1C Lab Results  Component Value Date   HGBA1C 5.2 09/19/2016   MPG 103 09/19/2016   External Labs 06/23/2021: Vitamin B12 562   281 382 7266  Hemoglobin A1C   2021-06-16    Estimated Average Glucose 123      Hemoglobin A1C 5.9   <5.7  Lipid Panel   2021-06-16    Cholesterol 177   <200  Cholesterol / HDL Ratio 5.36   0.00-4.99  HDL Cholesterol 33   >39  LDL Cholesterol (Calculation) SEE COMMENT   <130  LDL/HDL Ratio SEE COMMENT   <3.3  Non-HDL Cholesterol 144   <130  Triglycerides 448   <150   External Labs: Collected: 03/18/2021 A1c 6.8  Total cholesterol 182, triglycerides 233, HDL 32, non-HDL 150, LDL 103. Hemoglobin 14.7, hematocrit 43.3% Sodium 140, potassium 3.7, chloride 103, bicarb 27, BUN 9, creatinine 1.4. AST 32, ALT 59, alkaline phosphatase 113. eGFR 59  Collected 06/22/2022 at PCP   Hemoglobin A1C 7.7 <5.7 % The following HbA1c ranges recommended by the American Diabetes Association  (ADA) may be used as an aid in the diagnosis of diabetes mellitus.  HA1c Suggested Diagnosis  >=6.5% Diabetic  5.7% - 6.4% Pre-Diabetic  <5.7% Non-Diabetic   Estimated Average Glucose 174   Average Glucose is calculated using the equation AG = (28.7 x HgbA1c) - 46.7  based on the guidelines established by the ADA.   Lipid Panel  Cholesterol 147 <200 mg/dL    Triglycerides 161 <096 mg/dL    HDL Cholesterol 32 >04 mg/dL    Cholesterol / HDL Ratio 4.59 0.00-4.99 Ratio    Non-HDL Cholesterol 115 <130 mg/dL    LDL Cholesterol (Calculation) 46 <130 mg/dL LDL Cholesterol Levels*  Less than 100 mg/dL Optimal  540 to 981 mg/dL Near Optimal/ Above Optimal  130 to 159 mg/dL Borderline High   191 to 189 mg/dL High  478 mg/dL and above Very High  * Categories as recommended by the 2004 ATPIII guidelines   LDL/HDL Ratio 1.4 <3.3 Ratio _________________________________________________________________________  LDL Cholesterol Patient History  _________________________________________________________________________  Test Date: 12/16/2021  LDL Results: 33  Units: mg/dL  % Change: -  -------------------------------------------------------------------------  Test Date: 03/19/2022  LDL Results: 51  Units: mg/dL  % Change: +29%  -------------------------------------------------------------------------  Test Date: 06/18/2022  LDL Results: 46  Units: mg/dL  % Change: -9%  _________________________________________________________________________   Sodium 143 136-145 mEq/L    Potassium 3.5 3.5-5.1 mEq/L    Chloride 102 98-107 mEq/L    CO2 27 21-32 mmol/L    Glucose 195 74-106 mg/dL    BUN 10 5-62 mg/dL    Creatinine 1.30 8.65-7.84 mg/dL    Calcium 9.9 6.9-62.9 mg/dL    Protein 7.7 5.2-8.4 g/dL    Albumin 4.4 1.3-2.4 g/dL    Alkaline Phosphatase 158 25-150 IU/L    ALT (SGPT) 108 <6-78 IU/L    AST (SGOT) 48 0-40 IU/L    Bilirubin, Total 0.9 0.2-1.0 mg/dL    Globulin, Calculated 3.3 1.5-4.6 g/dL    Albumin/Globulin Ratio 1.3 1.1-2.5 mg/dL    BUN/Creatinine Ratio 9.3 11.0-26.0 Ratio    GFR/Black 93 >59 mL/min/1.61m2    GFR/White 80 >59 mL/min/1.62m2 GFR Categories in Chronic Kidney Disease (CKD)  GFR Category GFR (mL/min/1.73 sq. meters) Interpretation  G1 90 or greater Normal or high*  G2 60-89 Mild decrease*  G3a 45-59 Mild to moderate decrease  G3b 30-44 Moderate to severe decrease  G4 15-29 Severe decrease  G5 14 or less Kidney failure  *In the absence of kidney damage, neither GFR category G1 or G2 fulfill  the criteria for CKD (Kidney Int Suppl 2013; 3.1-150)  The CKD-EPI calculation is intended for use in patients 22 years of age and  older. Decreased calculation  accuracy may be seen in patients taking  medications that affect renal excretion, or in those patients with extremes in  muscle mass or diet.     IMPRESSION:    ICD-10-CM   1. Atherosclerosis of native coronary artery of native heart with other form of angina pectoris  I25.118 EKG 12-Lead    PCV ECHOCARDIOGRAM COMPLETE    fenofibrate (TRICOR) 145 MG tablet    sacubitril-valsartan (ENTRESTO) 97-103 MG    2. Agatston coronary artery calcium score less than 100  R93.1 EKG 12-Lead    PCV ECHOCARDIOGRAM COMPLETE    fenofibrate (TRICOR) 145 MG tablet    sacubitril-valsartan (ENTRESTO) 97-103 MG    3. Chronic combined systolic and diastolic heart failure  I50.42 PCV ECHOCARDIOGRAM COMPLETE    sacubitril-valsartan (ENTRESTO) 97-103 MG    Basic metabolic panel    Pro b natriuretic peptide (BNP)    4. Essential hypertension  I10     5. Dyslipidemia  E78.5     6. Hypertriglyceridemia  E78.1 fenofibrate (TRICOR) 145 MG tablet    7. Non-insulin dependent type 2 diabetes mellitus  E11.9     8. Former smoker  Z87.891     9. Family history of premature CAD  Z82.49        RECOMMENDATIONS: DESHONE FRIEDER is a 52 y.o. Caucasian male whose past medical history and cardiac risk factors include: Non-obstructive CAD per CCTA, mild CAC (32.2AU, 81st percentile), chronic combined systolic and diastolic heart failure, hypertension, hyperlipidemia, family history of premature CAD, type 2 diabetes, former smoker, history of excessive alcohol use.   Atherosclerosis of native coronary artery of native heart with other form of angina pectoris  Coronary CTA: Total CAC 32.3, 81st percentile, nonobstructive CAD. His precordial pain predominately noncardiac No use of sublingual nitroglycerin tablets since the last office visit. Strong family history of CAD with multiple cardiovascular risk factors Last coronary CTA from March 2023 illustrated no hemodynamically significant disease; however, noted to  have intermediate lesion in the LCx which is being treated medically. Even though his precordial discomfort appears to be noncardiac he has multiple cardiovascular risk factors including premature CAD and more than 1 family members.  His prior coronary CTA also at indeterminate lesion in the LCx and given the poorly controlled lipid profile disease progression cannot be ruled out.  We discussed undergoing invasive angiography to evaluate for obstructive CAD; however, patient would like to hold off.  He is very well aware to seek medical attention by going to the closest ER via EMS if his symptoms were to increase in intensity frequency or duration or has classic chest pain as discussed at today's visit. Educated him on the importance of improving modifiable cardiovascular risk factors Hemoglobin A1c needs to be better controlled. LDL within acceptable limits. Triglyceride levels need to be better controlled Outside labs from March 2024 independently reviewed and noted above  Chronic combined systolic and diastolic heart failure (HCC) Stage B, NYHA class II Echo 05/2021: LVEF 45-50%, global hypokinesis. History of significant alcohol consumption in the past. Medications reconciled. Increase Entresto 49/51 mg p.o. twice daily to Entresto 97/103 mg p.o. twice daily  Patient just picked up her prescription for 90 days for 49/51 mg tablets.  I have asked her to take Entresto 49/51 mg 2 tabs twice daily and to have labs done in 1 week to reevaluate kidney function.  Once he runs out of these tablets and his renal function remained stable we will send in a prescription for Entresto 97/103 mg p.o. twice daily. Repeat echocardiogram to reevaluate LVEF since initiation of GDMT  Essential hypertension Office blood pressures within acceptable limits but not at goal. Medication changes as discussed above. Reemphasized importance of low-salt diet. I have also asked him reach out to Dr. DohmeVickey Hugero see if he can  have a different facemask to increase compliance with CPAP usage  Dyslipidemia Hypertriglyceridemia Most recent lipids from March 2024 independently reviewed. LDL currently at goal at 46 mg/dL. Triglyceride levels remain uncontrolled. No longer able to afford Vascepa Start fenofibrate.  He is scheduled to have repeat labs including lipids with PCP in the coming months  Non-insulin dependent type 2 diabetes mellitus (HCC) Currently on metformin, Entresto, atorvastatin, Farxiga. Start fenofibrate. Reemphasized importance of glycemic control  Sleep apnea: Reemphasized the importance of device compliance. Currently managed by Dr. Vickey Huger.   FINAL MEDICATION LIST END OF ENCOUNTER: Meds ordered this encounter  Medications   fenofibrate (TRICOR) 145 MG tablet    Sig: Take 1 tablet (145 mg total) by mouth daily.    Dispense:  90 tablet    Refill:  0   sacubitril-valsartan (ENTRESTO) 97-103 MG    Sig: Take 1 tablet by mouth 2 (two) times daily.    Dispense:  180 tablet    Refill:  0    Medications Discontinued During This Encounter  Medication Reason   VASCEPA 1 g capsule    ENTRESTO 49-51 MG Dose change     Current Outpatient Medications:    allopurinol (ZYLOPRIM) 100 MG tablet, Take 100 mg by mouth 2 (two) times daily., Disp: , Rfl:    atorvastatin (LIPITOR) 40 MG tablet, TAKE 1 TABLET(40 MG) BY MOUTH DAILY, Disp: 90 tablet, Rfl: 3   FARXIGA 10 MG TABS tablet, TAKE 1 TABLET(10 MG) BY MOUTH DAILY BEFORE BREAKFAST, Disp: 90 tablet, Rfl: 0   fenofibrate (TRICOR) 145 MG tablet, Take 1 tablet (145 mg total) by mouth daily., Disp: 90 tablet, Rfl: 0   LORazepam (ATIVAN) 1 MG tablet, Take 1 mg by mouth every 8 (eight) hours as needed for anxiety., Disp: , Rfl:    metFORMIN (GLUCOPHAGE-XR) 500 MG 24 hr tablet, Take 500 mg by mouth every morning., Disp: , Rfl: 4   metoprolol succinate (TOPROL-XL) 50 MG 24 hr tablet, TAKE 1 TABLET(50 MG) BY MOUTH DAILY WITH OR IMMEDIATELY FOLLOWING A  MEAL, Disp: 90 tablet, Rfl: 0   Multiple Vitamins-Minerals (MULTI-VITAMIN GUMMIES) CHEW, Chew 1 tablet by mouth daily., Disp: , Rfl:    nitroGLYCERIN (NITROSTAT) 0.4 MG SL tablet, Place 1 tablet (0.4 mg total) under the tongue every 5 (five) minutes as needed for chest pain. If you require more than two tablets five minutes apart go to the nearest ER via EMS., Disp: 30 tablet, Rfl: 0   sacubitril-valsartan (ENTRESTO) 97-103 MG, Take 1 tablet by mouth 2 (two) times daily., Disp: 180 tablet, Rfl: 0   spironolactone (ALDACTONE) 25 MG tablet, TAKE 1 TABLET(25 MG) BY MOUTH EVERY MORNING, Disp: 90 tablet, Rfl: 0   venlafaxine XR (EFFEXOR-XR) 75 MG 24 hr capsule, Take 75 mg by mouth daily with breakfast., Disp: , Rfl:   Orders  Placed This Encounter  Procedures   Basic metabolic panel   Pro b natriuretic peptide (BNP)   EKG 12-Lead   PCV ECHOCARDIOGRAM COMPLETE    There are no Patient Instructions on file for this visit.   --Continue cardiac medications as reconciled in final medication list. --Return in about 4 weeks (around 08/07/2022) for Follow up, heart failure management.. Or sooner if needed. --Continue follow-up with your primary care physician regarding the management of your other chronic comorbid conditions.  Patient's questions and concerns were addressed to his satisfaction. He voices understanding of the instructions provided during this encounter.   This note was created using a voice recognition software as a result there may be grammatical errors inadvertently enclosed that do not reflect the nature of this encounter. Every attempt is made to correct such errors.  Tessa LernerSunit Zander Ingham, [place]DO, Woodcrest Surgery CenterFACC  Pager:  [phone] Office: [phone]

## 2022-07-19 ENCOUNTER — Observation Stay (HOSPITAL_COMMUNITY)
Admission: EM | Admit: 2022-07-19 | Discharge: 2022-07-21 | Disposition: A | Discharge status: Home / Self Care | Payer: BC Managed Care – PPO | Attending: Family Medicine | Admitting: Family Medicine

## 2022-07-19 ENCOUNTER — Other Ambulatory Visit: Payer: Self-pay

## 2022-07-19 ENCOUNTER — Encounter (HOSPITAL_COMMUNITY): Payer: Self-pay | Admitting: Emergency Medicine

## 2022-07-19 ENCOUNTER — Emergency Department (HOSPITAL_COMMUNITY): Payer: BC Managed Care – PPO

## 2022-07-19 DIAGNOSIS — E119 Type 2 diabetes mellitus without complications: Secondary | ICD-10-CM | POA: Insufficient documentation

## 2022-07-19 DIAGNOSIS — Z87891 Personal history of nicotine dependence: Secondary | ICD-10-CM | POA: Insufficient documentation

## 2022-07-19 DIAGNOSIS — E785 Hyperlipidemia, unspecified: Secondary | ICD-10-CM

## 2022-07-19 DIAGNOSIS — I209 Angina pectoris, unspecified: Secondary | ICD-10-CM

## 2022-07-19 DIAGNOSIS — Z7984 Long term (current) use of oral hypoglycemic drugs: Secondary | ICD-10-CM | POA: Insufficient documentation

## 2022-07-19 DIAGNOSIS — Z8249 Family history of ischemic heart disease and other diseases of the circulatory system: Secondary | ICD-10-CM | POA: Diagnosis not present

## 2022-07-19 DIAGNOSIS — I429 Cardiomyopathy, unspecified: Secondary | ICD-10-CM

## 2022-07-19 DIAGNOSIS — I214 Non-ST elevation (NSTEMI) myocardial infarction: Secondary | ICD-10-CM

## 2022-07-19 DIAGNOSIS — G4733 Obstructive sleep apnea (adult) (pediatric): Secondary | ICD-10-CM | POA: Diagnosis not present

## 2022-07-19 DIAGNOSIS — Z79899 Other long term (current) drug therapy: Secondary | ICD-10-CM | POA: Diagnosis not present

## 2022-07-19 DIAGNOSIS — E781 Pure hyperglyceridemia: Secondary | ICD-10-CM

## 2022-07-19 DIAGNOSIS — I110 Hypertensive heart disease with heart failure: Secondary | ICD-10-CM | POA: Insufficient documentation

## 2022-07-19 DIAGNOSIS — Z955 Presence of coronary angioplasty implant and graft: Secondary | ICD-10-CM | POA: Diagnosis not present

## 2022-07-19 DIAGNOSIS — R079 Chest pain, unspecified: Secondary | ICD-10-CM | POA: Diagnosis present

## 2022-07-19 DIAGNOSIS — I10 Essential (primary) hypertension: Secondary | ICD-10-CM | POA: Diagnosis present

## 2022-07-19 DIAGNOSIS — I25110 Atherosclerotic heart disease of native coronary artery with unstable angina pectoris: Principal | ICD-10-CM | POA: Insufficient documentation

## 2022-07-19 DIAGNOSIS — I5042 Chronic combined systolic (congestive) and diastolic (congestive) heart failure: Secondary | ICD-10-CM | POA: Diagnosis not present

## 2022-07-19 DIAGNOSIS — I200 Unstable angina: Secondary | ICD-10-CM | POA: Diagnosis present

## 2022-07-19 LAB — BASIC METABOLIC PANEL
Anion gap: 12 (ref 5–15)
BUN: 11 mg/dL (ref 6–20)
CO2: 22 mmol/L (ref 22–32)
Calcium: 9.4 mg/dL (ref 8.9–10.3)
Chloride: 102 mmol/L (ref 98–111)
Creatinine, Ser: 1.17 mg/dL (ref 0.61–1.24)
GFR, Estimated: >60 mL/min (ref >60)
Glucose, Bld: 205 mg/dL — ABNORMAL HIGH (ref 70–99)
Potassium: 2.9 mmol/L — ABNORMAL LOW (ref 3.5–5.1)
Sodium: 136 mmol/L (ref 135–145)

## 2022-07-19 LAB — CBC
HCT: 43.3 % (ref 39.0–52.0)
Hemoglobin: 14.8 g/dL (ref 13.0–17.0)
MCH: 29.4 pg (ref 26.0–34.0)
MCHC: 34.2 g/dL (ref 30.0–36.0)
MCV: 86.1 fL (ref 80.0–100.0)
Platelets: 259 10*3/uL (ref 150–400)
RBC: 5.03 MIL/uL (ref 4.22–5.81)
RDW: 14.1 % (ref 11.5–15.5)
WBC: 12.1 10*3/uL — ABNORMAL HIGH (ref 4.0–10.5)
nRBC: 0 % (ref 0.0–0.2)

## 2022-07-19 LAB — MAGNESIUM: Magnesium: 1.8 mg/dL (ref 1.7–2.4)

## 2022-07-19 LAB — TROPONIN I (HIGH SENSITIVITY)
Troponin I (High Sensitivity): 35 ng/L — ABNORMAL HIGH (ref <18)
Troponin I (High Sensitivity): 60 ng/L — ABNORMAL HIGH (ref <18)

## 2022-07-19 MED ORDER — ASPIRIN 81 MG PO CHEW
324.0000 mg | CHEWABLE_TABLET | Freq: Once | ORAL | Status: AC
  Administered 2022-07-19: 324 mg via ORAL
  Filled 2022-07-19: qty 4

## 2022-07-19 MED ORDER — POTASSIUM CHLORIDE 20 MEQ PO PACK
40.0000 meq | PACK | Freq: Two times a day (BID) | ORAL | Status: DC
  Administered 2022-07-20: 40 meq via ORAL
  Filled 2022-07-19 (×2): qty 2

## 2022-07-19 NOTE — ED Triage Notes (Addendum)
Pt reports CP that began after mowing the lawn outdoors today. Pt states pain radiates to his left arm, reports some SOB when he had the pain. Pt reports noting his blood pressure was elevated today as well. Took one nitro with relief of some pain.

## 2022-07-19 NOTE — ED Provider Notes (Incomplete)
[place] [facility] DEPARTMENT AT [facility] Provider Note  Medical Decision Making   HPI: Brandon Hogan is a 52 y.o. 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 Patient arrived via POV accompanied by wife.  History provided by patient and wife at bedside.  No interpreter required for this encounter.  Patient reports that earlier today he was mowing his lawn when he developed substernal chest pressure, severe.  Radiated to left arm.  Did not radiate to back or neck.  Was experiencing some shortness of breath.  Denies nausea, patient unsure regarding diaphoresis, states that he was already diaphoretic prior to chest pain due to the physical activity of mowing the lawn.  He checked his blood pressure several times, states that it was systolics 150s to 160s, as well as reviewing the diastolic to 100s to 110s.  ROS: As per HPI. Please see MAR for complete past medical history, surgical history, and social history.   Physical exam is without focal findings.  The differential includes but is not limited to  ACS, arrhythmia, pericardial tamponade, pericarditis, myocarditis, pneumonia, pneumothorax, esophageal, tear, perforated abdominal viscous, pulmonary embolism, aortic dissection, costochondritis, musculoskeletal chest wall pain, GERD.  Marland Kitchen  Additional history obtained from: Chart review External records from outside source obtained and reviewed including: Reviewed prior echo from February 2023, EF of 45 to 50%.  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  ED  provider interpretation of radiology/imaging: Chest x-ray without focal airspace opacification, cardiomediastinal silhouette derangement, pleural effusion, pneumothorax, bony displacement.  Labs ordered were interpreted by myself as well as my attending and were incorporated into the medical decision making process for this patient.  ED provider interpretation of labs: CBC with mild nonspecific leukocytosis of 12.1.  No anemia or thrombocytopenia.  BMP with moderate hypokalemia to 2.9, hyperglycemia without anion gap elevation  Interventions: Potassium  See the EMR for full details regarding lab and imaging results.  [name] is awake, alert, and HDS.  His exam does not have any focal findings.  Patient is currently symptom-free. Aspirin has been given.  The ECG reveals no anatomical ischemia representing STEMI, New-Onset Arrhythmia, or ischemic equivalent.  He has been risk stratified with a HEAR score of 7. Initial troponin is 35; delta troponin is 60.  The CXR is unremarkable for focal airspace disease.  The patient is afebrile and denies productive cough.  Therefore, I do not suspect Pneumonia. There is no evidence of Pneumothorax on physical exam or on the CXR. CXR shows no evidence of Esophageal Tear and there is no recent intractable emesis or esophageal instrumentation. There is no peritonitis or free air on CXR worrisome for a Perforated Abdominal Viscous.  The patient's pain is not tearing and it does not radiate to back. Pulses are present bilaterally in both the upper and lower extremities. CXR does not show a widened mediastinum. I have a very low suspicion for Aortic Dissection.  While, patient with episode of typical chest pain with rising troponin, concern for angina versus developing NSTEMI.  Feel that patient is most appropriate for admission.  Follows outpatient  with [facility] cardiology.  Consulted cardiology, and spoke with Dr. Melton Alar with [facility] cardiology.  She agrees  with admission for high risk chest pain, will do heparin drip.  States that we will likely get cardiac cath in AM. Recommends primary medicine admission.  Heparin drip ordered. Consulted medicine for admission, I spoke with Dr. Imogene Burn with the hospitalist service.  Patient accepted to the hospital service.  No additional acute events while patient was under my care in the ED   Consults: Cardiology, hospitalists  Disposition: ADMIT: I believe the patient requires admission for further care and management. The patient was admitted to hospitalist with cardiology consult. Please see inpatient provider note for additional treatment plan details.   The plan for this patient was discussed with Dr. Wilkie Aye, who voiced agreement and who oversaw evaluation and treatment of this patient.  Clinical Impression: No diagnosis found. Admit  Therapies: These medications and interventions were provided for the patient while in the ED. Medications  potassium chloride (KLOR-CON) packet 40 mEq (has no administration in time range)  aspirin chewable tablet 324 mg (324 mg Oral Given 07/19/22 2001)    MDM generated using voice dictation software and may contain dictation errors.  Please contact me for any clarification or with any questions.  Clinical Complexity A medically appropriate history, review of systems, and physical exam was performed.  Collateral history obtained from: *** I personally reviewed the labs, EKG, imaging as*** discussed above. Patient's presentation is most consistent with {LSEMCOPA:29396} ***Considered and ruled out life and body threatening conditions  Treatment: {LSTREATMENT:51051} Patient's {EMSDOH:[id number]} increases the complexity of managing their presentation. Medications: {LSDRUGS:51050} Discussed patient's care with providers from the following different specialties: ***  Physical Exam   ED Triage Vitals  Enc Vitals Group     BP 07/19/22 1953 (!) 132/98     Pulse Rate  07/19/22 1953 (!) 102     Resp 07/19/22 1953 18     Temp 07/19/22 1953 98.6 F (37 C)     Temp src --      SpO2 07/19/22 1953 95 %     Weight 07/19/22 1956 234 lb (106.1 kg)     Height 07/19/22 1956 6\' 2"  (1.88 m)     Head Circumference --      Peak Flow --      Pain Score 07/19/22 1954 3     Pain Loc --      Pain Edu? --      Excl. in GC? --      Physical Exam Vitals and nursing note reviewed.  Constitutional:      General: He is not in acute distress.    Appearance: He is well-developed.  HENT:     Head: Normocephalic and atraumatic.  Eyes:     Conjunctiva/sclera: Conjunctivae normal.  Cardiovascular:     Rate and Rhythm: Normal rate and regular rhythm.     Pulses:          Carotid pulses are 2+ on the right side.    Heart sounds: No murmur heard. Pulmonary:     Effort: Pulmonary effort is normal. No respiratory distress.     Breath sounds: Normal breath sounds.  Abdominal:     Palpations: Abdomen is soft.     Tenderness: There is no abdominal tenderness.  Musculoskeletal:        General: No swelling.     Cervical back: Neck supple.  Skin:    General: Skin is warm and dry.  Capillary Refill: Capillary refill takes less than 2 seconds.  Neurological:     Mental Status: He is alert.  Psychiatric:        Mood and Affect: Mood normal.       Procedure Note  Procedures  DG Chest 2 View  Final Result      Julianne Rice, MD Emergency Medicine, PGY-2  {Document cardiac monitor, telemetry assessment procedure when appropriate:1}   {   Click here for ABCD2, HEART and other calculatorsREFRESH Note before signing :1}    {Document critical care time when appropriate:1} {Document review of labs and clinical decision tools ie heart score, Chads2Vasc2 etc:1}  {Document your independent review of radiology images, and any outside records:1} {Document your discussion with family members, caretakers, and with consultants:1} {Document social determinants of  health affecting pt's care:1} {Document your decision making why or why not admission, treatments were needed:1}

## 2022-07-19 NOTE — ED Provider Notes (Signed)
[place] [facility] DE[facility] AT [facility] Provider Note  Medical Decision Making   HPI: Brandon Hogan is a 52 y.o. 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 Patient arrived via POV accompanied by wife.  History provided by patient and wife at bedside.  No interpreter required for this encounter.  Patient reports that earlier today he was mowing his lawn when he developed substernal chest pressure, severe.  Radiated to left arm.  Did not radiate to back or neck.  Was experiencing some shortness of breath.  Denies nausea, patient unsure regarding diaphoresis, states that he was already diaphoretic prior to chest pain due to the physical activity of mowing the lawn.  He checked his blood pressure several times, states that it was systolics 150s to 160s, as well as reviewing the diastolic to 100s to 110s.  ROS: As per HPI. Please see MAR for complete past medical history, surgical history, and social history.   Physical exam is without focal findings.  The differential includes but is not limited to  ACS, arrhythmia, pericardial tamponade, pericarditis, myocarditis, pneumonia, pneumothorax, esophageal, tear, perforated abdominal viscous, pulmonary embolism, aortic dissection, costochondritis, musculoskeletal chest wall pain, GERD.  Marland Kitchen  Additional history obtained from: Chart review External records from outside source obtained and reviewed including: Reviewed prior echo from February 2023, EF of 45 to 50%.  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  ED  provider interpretation of radiology/imaging: Chest x-ray without focal airspace opacification, cardiomediastinal silhouette derangement, pleural effusion, pneumothorax, bony displacement.  Labs ordered were interpreted by myself as well as my attending and were incorporated into the medical decision making process for this patient.  ED provider interpretation of labs: CBC with mild nonspecific leukocytosis of 12.1.  No anemia or thrombocytopenia.  BMP with moderate hypokalemia to 2.9, hyperglycemia without anion gap elevation  Interventions: Potassium  See the EMR for full details regarding lab and imaging results.  [name] is awake, alert, and HDS.  His exam does not have any focal findings.  Patient is currently symptom-free. Aspirin has been given.  The ECG reveals no anatomical ischemia representing STEMI, New-Onset Arrhythmia, or ischemic equivalent.  He has been risk stratified with a HEAR score of 7. Initial troponin is 35; delta troponin is 60.  The CXR is unremarkable for focal airspace disease.  The patient is afebrile and denies productive cough.  Therefore, I do not suspect Pneumonia. There is no evidence of Pneumothorax on physical exam or on the CXR. CXR shows no evidence of Esophageal Tear and there is no recent intractable emesis or esophageal instrumentation. There is no peritonitis or free air on CXR worrisome for a Perforated Abdominal Viscous.  The patient's pain is not tearing and it does not radiate to back. Pulses are present bilaterally in both the upper and lower extremities. CXR does not show a widened mediastinum. I have a very low suspicion for Aortic Dissection.  While, patient with episode of typical chest pain with rising troponin, concern for angina versus developing NSTEMI.  Feel that patient is most appropriate for admission.  Follows outpatient  with [facility] cardiology.  Consulted cardiology, and spoke with Dr. Melton Alar with [facility] cardiology.  She agrees  with admission for high risk chest pain, will do heparin drip.  States that we will likely get cardiac cath in AM. Recommends primary medicine admission.  Heparin drip ordered. Consulted medicine for admission, I spoke with Dr. Imogene Burn with the hospitalist service.  Patient accepted to the hospital service.  No additional acute events while patient was under my care in the ED   Consults: Cardiology, hospitalists  Disposition: ADMIT: I believe the patient requires admission for further care and management. The patient was admitted to hospitalist with cardiology consult. Please see inpatient provider note for additional treatment plan details.   The plan for this patient was discussed with Dr. Wilkie Aye, who voiced agreement and who oversaw evaluation and treatment of this patient.  Clinical Impression: No diagnosis found. Admit  Therapies: These medications and interventions were provided for the patient while in the ED. Medications  potassium chloride (KLOR-CON) packet 40 mEq (40 mEq Oral Given 07/20/22 0020)  heparin ADULT infusion 100 units/mL (25000 units/268mL) (1,400 Units/hr Intravenous New Bag/Given 07/20/22 0110)  acetaminophen (TYLENOL) tablet 1,000 mg (has no administration in time range)  nitroGLYCERIN (NITROGLYN) 2 % ointment 0.5 inch (has no administration in time range)  aspirin chewable tablet 324 mg (324 mg Oral Given 07/19/22 2001)  heparin bolus via infusion 4,000 Units (4,000 Units Intravenous Bolus from Bag 07/20/22 0110)    MDM generated using voice dictation software and may contain dictation errors.  Please contact me for any clarification or with any questions.  Clinical Complexity A medically appropriate history, review of systems, and physical exam was performed.  Collateral history obtained from: Wife at bedside I personally reviewed the labs, EKG, imaging as discussed above. Patient's presentation is most consistent with acute complicated illness / injury requiring  diagnostic workup Considered and ruled out life and body threatening conditions  Treatment: Hospitalization Medications: Prescription Discussed patient's care with providers from the following different specialties: Cardiology, hospitalists  Physical Exam   ED Triage Vitals  Enc Vitals Group     BP 07/19/22 1953 (!) 132/98     Pulse Rate 07/19/22 1953 (!) 102     Resp 07/19/22 1953 18     Temp 07/19/22 1953 98.6 F (37 C)     Temp src --      SpO2 07/19/22 1953 95 %     Weight 07/19/22 1956 234 lb (106.1 kg)     Height 07/19/22 1956 6\' 2"  (1.88 m)     Head Circumference --      Peak Flow --      Pain Score 07/19/22 1954 3     Pain Loc --      Pain Edu? --      Excl. in GC? --      Physical Exam Vitals and nursing note reviewed.  Constitutional:      General: He is not in acute distress.    Appearance: He is well-developed.  HENT:     Head: Normocephalic and atraumatic.  Eyes:     Conjunctiva/sclera: Conjunctivae normal.  Cardiovascular:     Rate and Rhythm: Normal rate and regular rhythm.     Pulses:          Carotid pulses are 2+ on the right side.    Heart sounds: No murmur heard. Pulmonary:     Effort: Pulmonary effort is normal. No respiratory distress.     Breath sounds:  Normal breath sounds.  Abdominal:     Palpations: Abdomen is soft.     Tenderness: There is no abdominal tenderness.  Musculoskeletal:        General: No swelling.     Cervical back: Neck supple.  Skin:    General: Skin is warm and dry.     Capillary Refill: Capillary refill takes less than 2 seconds.  Neurological:     Mental Status: He is alert.  Psychiatric:        Mood and Affect: Mood normal.       Procedure Note  Procedures  DG Chest 2 View  Final Result      Julianne Rice, MD Emergency Medicine, PGY-2   Curley Spice, MD 07/20/22 0114    Rozelle Logan, DO 07/20/22 2116

## 2022-07-20 ENCOUNTER — Encounter (HOSPITAL_COMMUNITY): Payer: Self-pay | Admitting: Internal Medicine

## 2022-07-20 ENCOUNTER — Encounter (HOSPITAL_COMMUNITY)
Admission: EM | Disposition: A | Discharge status: Home / Self Care | Payer: Self-pay | Source: Home / Self Care | Attending: Emergency Medicine

## 2022-07-20 DIAGNOSIS — I200 Unstable angina: Secondary | ICD-10-CM | POA: Diagnosis not present

## 2022-07-20 DIAGNOSIS — I214 Non-ST elevation (NSTEMI) myocardial infarction: Secondary | ICD-10-CM

## 2022-07-20 DIAGNOSIS — I5042 Chronic combined systolic (congestive) and diastolic (congestive) heart failure: Secondary | ICD-10-CM | POA: Diagnosis not present

## 2022-07-20 DIAGNOSIS — I429 Cardiomyopathy, unspecified: Secondary | ICD-10-CM

## 2022-07-20 DIAGNOSIS — I10 Essential (primary) hypertension: Secondary | ICD-10-CM

## 2022-07-20 DIAGNOSIS — I110 Hypertensive heart disease with heart failure: Secondary | ICD-10-CM | POA: Diagnosis not present

## 2022-07-20 DIAGNOSIS — E119 Type 2 diabetes mellitus without complications: Secondary | ICD-10-CM

## 2022-07-20 DIAGNOSIS — I25110 Atherosclerotic heart disease of native coronary artery with unstable angina pectoris: Secondary | ICD-10-CM | POA: Diagnosis not present

## 2022-07-20 HISTORY — PX: LEFT HEART CATH AND CORONARY ANGIOGRAPHY: CATH118249

## 2022-07-20 HISTORY — PX: CORONARY STENT INTERVENTION: CATH118234

## 2022-07-20 HISTORY — PX: CORONARY IMAGING/OCT: CATH118326

## 2022-07-20 LAB — LIPID PANEL
Cholesterol: 169 mg/dL (ref 0–200)
HDL: 30 mg/dL — ABNORMAL LOW (ref >40)
LDL Cholesterol: 65 mg/dL (ref 0–99)
Total CHOL/HDL Ratio: 5.6 RATIO
Triglycerides: 371 mg/dL — ABNORMAL HIGH (ref <150)
VLDL: 74 mg/dL — ABNORMAL HIGH (ref 0–40)

## 2022-07-20 LAB — CBC
HCT: 42.9 % (ref 39.0–52.0)
Hemoglobin: 14.9 g/dL (ref 13.0–17.0)
MCH: 29.9 pg (ref 26.0–34.0)
MCHC: 34.7 g/dL (ref 30.0–36.0)
MCV: 86.1 fL (ref 80.0–100.0)
Platelets: 232 10*3/uL (ref 150–400)
RBC: 4.98 MIL/uL (ref 4.22–5.81)
RDW: 14.5 % (ref 11.5–15.5)
WBC: 9.2 10*3/uL (ref 4.0–10.5)
nRBC: 0 % (ref 0.0–0.2)

## 2022-07-20 LAB — BASIC METABOLIC PANEL
Anion gap: 10 (ref 5–15)
BUN: 26 mg/dL — ABNORMAL HIGH (ref 6–20)
CO2: 24 mmol/L (ref 22–32)
Calcium: 8.7 mg/dL — ABNORMAL LOW (ref 8.9–10.3)
Chloride: 104 mmol/L (ref 98–111)
Creatinine, Ser: 1.21 mg/dL (ref 0.61–1.24)
GFR, Estimated: >60 mL/min (ref >60)
Glucose, Bld: 172 mg/dL — ABNORMAL HIGH (ref 70–99)
Potassium: 3 mmol/L — ABNORMAL LOW (ref 3.5–5.1)
Sodium: 138 mmol/L (ref 135–145)

## 2022-07-20 LAB — GLUCOSE, CAPILLARY
Glucose-Capillary: 182 mg/dL — ABNORMAL HIGH (ref 70–99)
Glucose-Capillary: 202 mg/dL — ABNORMAL HIGH (ref 70–99)

## 2022-07-20 LAB — POCT ACTIVATED CLOTTING TIME
Activated Clotting Time: 239 seconds
Activated Clotting Time: 314 seconds

## 2022-07-20 LAB — HEPARIN LEVEL (UNFRACTIONATED)
Heparin Unfractionated: 0.32 IU/mL (ref 0.30–0.70)
Heparin Unfractionated: 0.18 IU/mL — ABNORMAL LOW (ref 0.30–0.70)

## 2022-07-20 SURGERY — LEFT HEART CATH AND CORONARY ANGIOGRAPHY
Anesthesia: LOCAL | Laterality: Right

## 2022-07-20 MED ORDER — HEPARIN SODIUM (PORCINE) 1000 UNIT/ML IJ SOLN
INTRAMUSCULAR | Status: AC
  Filled 2022-07-20: qty 10

## 2022-07-20 MED ORDER — SODIUM CHLORIDE 0.9% FLUSH
3.0000 mL | Freq: Two times a day (BID) | INTRAVENOUS | Status: DC
  Administered 2022-07-21: 3 mL via INTRAVENOUS

## 2022-07-20 MED ORDER — NITROGLYCERIN 1 MG/10 ML FOR IR/CATH LAB
INTRA_ARTERIAL | Status: AC
  Filled 2022-07-20: qty 10

## 2022-07-20 MED ORDER — INSULIN ASPART 100 UNIT/ML IJ SOLN
0.0000 [IU] | Freq: Three times a day (TID) | INTRAMUSCULAR | Status: DC
  Administered 2022-07-20 – 2022-07-21 (×2): 2 [IU] via SUBCUTANEOUS

## 2022-07-20 MED ORDER — TICAGRELOR 90 MG PO TABS
ORAL_TABLET | ORAL | Status: AC
  Filled 2022-07-20: qty 1

## 2022-07-20 MED ORDER — TICAGRELOR 90 MG PO TABS
ORAL_TABLET | ORAL | Status: DC | PRN
  Administered 2022-07-20: 180 mg via ORAL

## 2022-07-20 MED ORDER — ONDANSETRON HCL 4 MG/2ML IJ SOLN: 4.0000 mg | Freq: Four times a day (QID) | INTRAMUSCULAR | Status: DC | PRN

## 2022-07-20 MED ORDER — POTASSIUM CHLORIDE CRYS ER 20 MEQ PO TBCR
40.0000 meq | EXTENDED_RELEASE_TABLET | Freq: Once | ORAL | Status: AC
  Administered 2022-07-20: 40 meq via ORAL
  Filled 2022-07-20: qty 2

## 2022-07-20 MED ORDER — HYDRALAZINE HCL 20 MG/ML IJ SOLN: 10.0000 mg | INTRAMUSCULAR | Status: AC | PRN

## 2022-07-20 MED ORDER — HEPARIN SODIUM (PORCINE) 5000 UNIT/ML IJ SOLN
5000.0000 [IU] | Freq: Three times a day (TID) | INTRAMUSCULAR | Status: DC
  Administered 2022-07-20 – 2022-07-21 (×2): 5000 [IU] via SUBCUTANEOUS
  Filled 2022-07-20 (×2): qty 1

## 2022-07-20 MED ORDER — LABETALOL HCL 5 MG/ML IV SOLN: 10.0000 mg | INTRAVENOUS | Status: AC | PRN

## 2022-07-20 MED ORDER — IOHEXOL 350 MG/ML SOLN
INTRAVENOUS | Status: DC | PRN
  Administered 2022-07-20: 120 mL

## 2022-07-20 MED ORDER — LIDOCAINE HCL (PF) 1 % IJ SOLN
INTRAMUSCULAR | Status: DC | PRN
  Administered 2022-07-20: 5 mL

## 2022-07-20 MED ORDER — HEPARIN (PORCINE) 25000 UT/250ML-% IV SOLN
1400.0000 [IU]/h | INTRAVENOUS | Status: DC
  Administered 2022-07-20: 1400 [IU]/h via INTRAVENOUS
  Filled 2022-07-20: qty 250

## 2022-07-20 MED ORDER — NITROGLYCERIN 2 % TD OINT
0.5000 [in_us] | TOPICAL_OINTMENT | Freq: Four times a day (QID) | TRANSDERMAL | Status: DC
  Administered 2022-07-20 – 2022-07-21 (×6): 0.5 [in_us] via TOPICAL
  Filled 2022-07-20 (×6): qty 1

## 2022-07-20 MED ORDER — LIDOCAINE HCL (PF) 1 % IJ SOLN
INTRAMUSCULAR | Status: AC
  Filled 2022-07-20: qty 30

## 2022-07-20 MED ORDER — NITROGLYCERIN 1 MG/10 ML FOR IR/CATH LAB
INTRA_ARTERIAL | Status: DC | PRN
  Administered 2022-07-20 (×2): 200 ug

## 2022-07-20 MED ORDER — INSULIN ASPART 100 UNIT/ML IJ SOLN
2.0000 [IU] | Freq: Three times a day (TID) | INTRAMUSCULAR | Status: DC
  Administered 2022-07-20 – 2022-07-21 (×2): 2 [IU] via SUBCUTANEOUS

## 2022-07-20 MED ORDER — ORAL CARE MOUTH RINSE: 15.0000 mL | OROMUCOSAL | Status: DC | PRN

## 2022-07-20 MED ORDER — ACETAMINOPHEN 500 MG PO TABS
1000.0000 mg | ORAL_TABLET | Freq: Once | ORAL | Status: AC
  Administered 2022-07-20: 1000 mg via ORAL
  Filled 2022-07-20: qty 2

## 2022-07-20 MED ORDER — TICAGRELOR 90 MG PO TABS
90.0000 mg | ORAL_TABLET | Freq: Two times a day (BID) | ORAL | Status: DC
  Administered 2022-07-20 – 2022-07-21 (×2): 90 mg via ORAL
  Filled 2022-07-20 (×2): qty 1

## 2022-07-20 MED ORDER — MIDAZOLAM HCL 2 MG/2ML IJ SOLN
INTRAMUSCULAR | Status: AC
  Filled 2022-07-20: qty 2

## 2022-07-20 MED ORDER — ACETAMINOPHEN 325 MG PO TABS: 650.0000 mg | ORAL_TABLET | ORAL | Status: DC | PRN

## 2022-07-20 MED ORDER — VERAPAMIL HCL 2.5 MG/ML IV SOLN: INTRAVENOUS | Status: DC | PRN

## 2022-07-20 MED ORDER — SODIUM CHLORIDE 0.9 % IV SOLN: 250.0000 mL | INTRAVENOUS | Status: DC | PRN

## 2022-07-20 MED ORDER — ACETAMINOPHEN 500 MG PO TABS
500.0000 mg | ORAL_TABLET | Freq: Four times a day (QID) | ORAL | Status: DC | PRN
  Administered 2022-07-20 (×2): 500 mg via ORAL
  Filled 2022-07-20 (×2): qty 1

## 2022-07-20 MED ORDER — SODIUM CHLORIDE 0.9 % WEIGHT BASED INFUSION: 1.0000 mL/kg/h | INTRAVENOUS | Status: DC

## 2022-07-20 MED ORDER — SODIUM CHLORIDE 0.9% FLUSH: 3.0000 mL | Freq: Two times a day (BID) | INTRAVENOUS | Status: DC

## 2022-07-20 MED ORDER — POTASSIUM CHLORIDE 10 MEQ/100ML IV SOLN
10.0000 meq | INTRAVENOUS | Status: AC
  Administered 2022-07-20 (×2): 10 meq via INTRAVENOUS
  Filled 2022-07-20 (×2): qty 100

## 2022-07-20 MED ORDER — HEPARIN SODIUM (PORCINE) 1000 UNIT/ML IJ SOLN
INTRAMUSCULAR | Status: DC | PRN
  Administered 2022-07-20: 3000 [IU] via INTRAVENOUS
  Administered 2022-07-20: 5500 [IU] via INTRAVENOUS
  Administered 2022-07-20: 5000 [IU] via INTRAVENOUS

## 2022-07-20 MED ORDER — SODIUM CHLORIDE 0.9% FLUSH: 3.0000 mL | INTRAVENOUS | Status: DC | PRN

## 2022-07-20 MED ORDER — ASPIRIN 81 MG PO CHEW
81.0000 mg | CHEWABLE_TABLET | ORAL | Status: AC
  Administered 2022-07-20: 81 mg via ORAL
  Filled 2022-07-20: qty 1

## 2022-07-20 MED ORDER — TRAMADOL HCL 50 MG PO TABS: 50.0000 mg | ORAL_TABLET | Freq: Four times a day (QID) | ORAL | Status: DC | PRN

## 2022-07-20 MED ORDER — MIDAZOLAM HCL 2 MG/2ML IJ SOLN
INTRAMUSCULAR | Status: DC | PRN
  Administered 2022-07-20: 1 mg via INTRAVENOUS

## 2022-07-20 MED ORDER — HEPARIN (PORCINE) IN NACL 1000-0.9 UT/500ML-% IV SOLN
INTRAVENOUS | Status: DC | PRN
  Administered 2022-07-20 (×2): 500 mL

## 2022-07-20 MED ORDER — HEPARIN BOLUS VIA INFUSION
4000.0000 [IU] | Freq: Once | INTRAVENOUS | Status: AC
  Administered 2022-07-20: 4000 [IU] via INTRAVENOUS
  Filled 2022-07-20: qty 4000

## 2022-07-20 MED ORDER — FENTANYL CITRATE (PF) 100 MCG/2ML IJ SOLN
INTRAMUSCULAR | Status: AC
  Filled 2022-07-20: qty 2

## 2022-07-20 MED ORDER — SODIUM CHLORIDE 0.9 % WEIGHT BASED INFUSION
3.0000 mL/kg/h | INTRAVENOUS | Status: AC
  Administered 2022-07-20: 3 mL/kg/h via INTRAVENOUS

## 2022-07-20 MED ORDER — FENTANYL CITRATE (PF) 100 MCG/2ML IJ SOLN
INTRAMUSCULAR | Status: DC | PRN
  Administered 2022-07-20: 50 ug via INTRAVENOUS

## 2022-07-20 MED ORDER — SODIUM CHLORIDE 0.9 % IV SOLN: INTRAVENOUS | Status: AC

## 2022-07-20 SURGICAL SUPPLY — 25 items
BALL SAPPHIRE NC24 4.5X12 (BALLOONS) ×2
BALLN SAPPHIRE 3.0X10 (BALLOONS) ×2
BALLOON SAPPHIRE 3.0X10 (BALLOONS) IMPLANT
BALLOON SAPPHIRE NC24 4.5X12 (BALLOONS) IMPLANT
CATH DRAGONFLY OPSTAR (CATHETERS) IMPLANT
CATH LAUNCHER 6FR EBU 3 (CATHETERS) IMPLANT
CATH LAUNCHER 6FR JR4 (CATHETERS) IMPLANT
CATH OPTITORQUE TIG 4.0 5F (CATHETERS) IMPLANT
DEVICE RAD COMP TR BAND LRG (VASCULAR PRODUCTS) IMPLANT
GLIDESHEATH SLEND A-KIT 6F 22G (SHEATH) IMPLANT
GLIDESHEATH SLEND SS 6F .021 (SHEATH) IMPLANT
GUIDEWIRE INQWIRE 1.5J.035X260 (WIRE) IMPLANT
GUIDEWIRE PRESSURE X 175 (WIRE) IMPLANT
INQWIRE 1.5J .035X260CM (WIRE) ×2
KIT ENCORE 26 ADVANTAGE (KITS) IMPLANT
KIT HEART LEFT (KITS) ×2 IMPLANT
KIT HEMO VALVE WATCHDOG (MISCELLANEOUS) IMPLANT
PACK CARDIAC CATHETERIZATION (CUSTOM PROCEDURE TRAY) ×2 IMPLANT
SHEATH PROBE COVER 6X72 (BAG) IMPLANT
STENT SYNERGY XD 4.0X16 (Permanent Stent) IMPLANT
SYNERGY XD 4.0X16 (Permanent Stent) ×2 IMPLANT
SYR MEDRAD MARK 7 150ML (SYRINGE) ×2 IMPLANT
TRANSDUCER W/STOPCOCK (MISCELLANEOUS) ×2 IMPLANT
TUBING CIL FLEX 10 FLL-RA (TUBING) ×2 IMPLANT
WIRE ASAHI PROWATER 180CM (WIRE) IMPLANT

## 2022-07-20 NOTE — Assessment & Plan Note (Signed)
Continue with toprol-xl 25 mg daily.

## 2022-07-20 NOTE — Interval H&P Note (Signed)
History and Physical Interval Note:  07/20/2022 1:33 PM  Brandon Hogan  has presented today for surgery, with the diagnosis of nstemi.  The various methods of treatment have been discussed with the patient and family. After consideration of risks, benefits and other options for treatment, the patient has consented to  Procedure(s): LEFT HEART CATH AND CORONARY ANGIOGRAPHY (N/A) as a surgical intervention.  The patient's history has been reviewed, patient examined, no change in status, stable for surgery.  I have reviewed the patient's chart and labs.  Questions were answered to the patient's satisfaction.    2016 Appropriate Use Criteria for Coronary Revascularization in Patients With Acute Coronary Syndrome NSTEMI/Unstable angina, stabilized patient at high risk Indication:  Revascularization by PCI or CABG of 1 or more arteries in a patient with NSTEMI or unstable angina with Stabilization after presentation High risk for clinical events A (7) Indication: 16; Score 7 329   Bill Yohn J Mckinley Olheiser

## 2022-07-20 NOTE — Progress Notes (Signed)
ANTICOAGULATION CONSULT NOTE - Initial Consult  Pharmacy Consult for heparin Indication: chest pain/ACS  No Known Allergies  Patient Measurements: Height: 6\' 2"  (188 cm) Weight: 106.1 kg (234 lb) IBW/kg (Calculated) : 82.2 Heparin Dosing Weight: 100kg  Vital Signs: Temp: 98.2 F (36.8 C) (04/14 2209) BP: 144/93 (04/15 0000) Pulse Rate: 80 (04/15 0000)  Labs: Recent Labs    07/19/22 1957 07/19/22 2210  HGB 14.8  --   HCT 43.3  --   PLT 259  --   CREATININE 1.17  --   TROPONINIHS 35* 60*    Estimated Creatinine Clearance: 98.1 mL/min (by C-G formula based on SCr of 1.17 mg/dL).   Medical History: Past Medical History:  Diagnosis Date   Cardiomyopathy    Diabetes mellitus without complication    Gallstone    Hyperlipidemia    Hypertension     Assessment: 52yo male c/o CP radiating to LUE and associated w/ SOB, some relief w/ NTG x1, troponin elevated and rising >> to begin heparin.  Goal of Therapy:  Heparin level 0.3-0.7 units/ml Monitor platelets by anticoagulation protocol: Yes   Plan:  Heparin 4000 units IV bolus x1 followed by infusion at 1400 units/hr. Monitor heparin levels and CBC.  Vernard Gambles, PharmD, BCPS  07/20/2022,12:20 AM

## 2022-07-20 NOTE — Assessment & Plan Note (Signed)
Hold metformina. Add SSI.

## 2022-07-20 NOTE — ED Notes (Signed)
ED TO INPATIENT HANDOFF REPORT  ED Nurse Name and Phone #: [phone]  S Name/Age/Gender Brandon Hogan 52 y.o. male Room/Bed: 009C/009C  Code Status   Code Status: Full Code  Home/SNF/Other Home Patient oriented to: self, place, time, and situation Is this baseline? Yes   Triage Complete: Triage complete  Chief Complaint Unstable angina [I20.0]  Triage Note Pt reports CP that began after mowing the lawn outdoors today. Pt states pain radiates to his left arm, reports some SOB when he had the pain. Pt reports noting his blood pressure was elevated today as well. Took one nitro with relief of some pain.    Allergies No Known Allergies  Level of Care/Admitting Diagnosis ED Disposition     ED Disposition  Admit   Condition  --   Comment  Hospital Area: [facility] [[id number]]  Level of Care: Progressive [102]  Admit to Progressive based on following criteria: CARDIOVASCULAR & THORACIC of moderate stability with acute coronary syndrome symptoms/low risk myocardial infarction/hypertensive urgency/arrhythmias/heart failure potentially compromising stability and stable post cardiovascular intervention patients.  May place patient in observation at [facility] or [facility] if equivalent level of care is available:: No  Covid Evaluation: Asymptomatic - no recent exposure (last 10 days) testing not required  Diagnosis: Unstable angina [762831]  Admitting Physician: Imogene Burn, ERIC [[id number]]  Attending Physician: Imogene Burn, ERIC [[id number]]          B Medical/Surgery History Past Medical History:  Diagnosis Date   Cardiomyopathy    Choledocholithiasis 09/16/2016   Diabetes mellitus without complication    Elevated LFTs 09/16/2016   Gallstone    Hyperlipidemia    Hypertension    Pancreatitis: Post ERCP mild pancreatitis 09/20/2016   Past Surgical History:  Procedure Laterality Date   CHOLECYSTECTOMY N/A 09/18/2016   Procedure: LAPAROSCOPIC CHOLECYSTECTOMY;   Surgeon: Almond Lint, MD;  Location: [facility] OR;  Service: General;  Laterality: N/A;   CHOLECYSTECTOMY  2018   ERCP N/A 09/18/2016   Procedure: ENDOSCOPIC RETROGRADE CHOLANGIOPANCREATOGRAPHY (ERCP);  Surgeon: Iva Boop, MD;  Location: [facility] OR;  Service: Endoscopy;  Laterality: N/A;     A IV Location/Drains/Wounds Patient Lines/Drains/Airways Status     Active Line/Drains/Airways     Name Placement date Placement time Site Days   Peripheral IV 07/19/22 20 G Anterior;Distal;Left;Upper Arm 07/19/22  2211  Arm  1   Incision (Closed) 09/18/16 Abdomen Other (Comment) 09/18/16  1055  -- 2131   Incision - 4 Ports Abdomen 1: Umbilicus 2: Mid;Upper 3: Right;Medial 4: Right;Lateral 09/18/16  1018  -- 2131            Intake/Output Last 24 hours No intake or output data in the 24 hours ending 07/20/22 5176  Labs/Imaging Results for orders placed or performed during the hospital encounter of 07/19/22 (from the past 48 hour(s))  Basic metabolic panel     Status: Abnormal   Collection Time: 07/19/22  7:57 PM  Result Value Ref Range   Sodium 136 135 - 145 mmol/L   Potassium 2.9 (L) 3.5 - 5.1 mmol/L   Chloride 102 98 - 111 mmol/L   CO2 22 22 - 32 mmol/L   Glucose, Bld 205 (H) 70 - 99 mg/dL    Comment: Glucose reference range applies only to samples taken after fasting for at least 8 hours.   BUN 11 6 - 20 mg/dL   Creatinine, Ser 1.60 0.61 - 1.24 mg/dL   Calcium 9.4 8.9 - 73.7 mg/dL   GFR,  Estimated >60 >60 mL/min    Comment: (NOTE) Calculated using the CKD-EPI Creatinine Equation (2021)    Anion gap 12 5 - 15    Comment: Performed at [facility], [address], [place], [place] [address]  CBC     Status: Abnormal   Collection Time: 07/19/22  7:57 PM  Result Value Ref Range   WBC 12.1 (H) 4.0 - 10.5 K/uL   RBC 5.03 4.22 - 5.81 MIL/uL   Hemoglobin 14.8 13.0 - 17.0 g/dL   HCT 04.8 88.9 - 16.9 %   MCV 86.1 80.0 - 100.0 fL   MCH 29.4 26.0 - 34.0 pg   MCHC 34.2 30.0 - 36.0  g/dL   RDW 45.0 38.8 - 82.8 %   Platelets 259 150 - 400 K/uL   nRBC 0.0 0.0 - 0.2 %    Comment: Performed at [facility], [address] [place], [place] [address]  Troponin I (High Sensitivity)     Status: Abnormal   Collection Time: 07/19/22  7:57 PM  Result Value Ref Range   Troponin I (High Sensitivity) 35 (H) <18 ng/L    Comment: (NOTE) Elevated high sensitivity troponin I (hsTnI) values and significant  changes across serial measurements may suggest ACS but many other  chronic and acute conditions are known to elevate hsTnI results.  Refer to the "Links" section for chest pain algorithms and additional  guidance. Performed at [facility], [address], [place], [place] [address]   Troponin I (High Sensitivity)     Status: Abnormal   Collection Time: 07/19/22 10:10 PM  Result Value Ref Range   Troponin I (High Sensitivity) 60 (H) <18 ng/L    Comment: CRITICAL RESULT CALLED TO, READ BACK BY AND VERIFIED WITH [place] MOOREFIELD RN 07/19/22 2314 M KOROLESKI (NOTE) Elevated high sensitivity troponin I (hsTnI) values and significant  changes across serial measurements may suggest ACS but many other  chronic and acute conditions are known to elevate hsTnI results.  Refer to the "Links" section for chest pain algorithms and additional  guidance. Performed at [facility], [address], [place], [place] [address]   Magnesium     Status: None   Collection Time: 07/19/22 10:10 PM  Result Value Ref Range   Magnesium 1.8 1.7 - 2.4 mg/dL    Comment: Performed at [facility], [address], [place], [place] [address]   DG Chest 2 View  Result Date: 07/19/2022 CLINICAL DATA:  Chest pain. EXAM: CHEST - 2 VIEW COMPARISON:  05/11/2021 FINDINGS: The cardiomediastinal contours are normal. The lungs are clear. Pulmonary vasculature is normal. No consolidation, pleural effusion, or pneumothorax. No acute osseous abnormalities are seen. IMPRESSION: Negative  radiographs of the chest. Electronically Signed   By: Narda Rutherford M.D.   On: 07/19/2022 21:02    Pending Labs Unresulted Labs (From admission, onward)     Start     Ordered   07/21/22 0500  Heparin level (unfractionated)  Daily,   R     See Hyperspace for full Linked Orders Report.   07/20/22 0023   07/21/22 0500  CBC  Daily,   R     See Hyperspace for full Linked Orders Report.   07/20/22 0023   07/20/22 0700  Heparin level (unfractionated)  Once-Timed,   URGENT        07/20/22 0023   07/20/22 0045  Lipid panel  Add-on,   AD  07/20/22 0044            Vitals/Pain Today's Vitals   07/20/22 0415 07/20/22 0500 07/20/22 0600 07/20/22 0612  BP: 106/75 106/77 115/85   Pulse: 76 73 80   Resp: Temp:    98.2 F (36.8 C)  TempSrc:    Oral  SpO2: 91% 94% 98%   Weight:      Height:      PainSc:        Isolation Precautions No active isolations  Medications Medications  potassium chloride (KLOR-CON) packet 40 mEq (40 mEq Oral Given 07/20/22 0020)  heparin ADULT infusion 100 units/mL (25000 units/266mL) (1,400 Units/hr Intravenous New Bag/Given 07/20/22 0110)  nitroGLYCERIN (NITROGLYN) 2 % ointment 0.5 inch (0.5 inches Topical Given 07/20/22 0617)  aspirin chewable tablet 324 mg (324 mg Oral Given 07/19/22 2001)  heparin bolus via infusion 4,000 Units (4,000 Units Intravenous Bolus from Bag 07/20/22 0110)  acetaminophen (TYLENOL) tablet 1,000 mg (1,000 mg Oral Given 07/20/22 0201)    Mobility walks     Focused Assessments Cardiac Assessment Handoff:    No results found for: "CKTOTAL", "CKMB", "CKMBINDEX", "TROPONINI" No results found for: "DDIMER" Does the Patient currently have chest pain? No    R Recommendations: See Admitting Provider Note  Report given to:   Additional Notes:

## 2022-07-20 NOTE — Assessment & Plan Note (Addendum)
Observation progressive bed. IV heparin. NTG paste. Cardiology consulted([facility]). NPO. Continue lipitor. Check lipid panel. Continue ASA.

## 2022-07-20 NOTE — Subjective & Objective (Signed)
CC: chest pain HPI: 52 year old male history of cardiomyopathy EF of 45%, hypertension, type 2 diabetes without insulin, early family history of coronary disease with his mom dying in the 65s of an MI, his brother and his father both having MIs in their 26s, presents to the ER today with chest pain.  Patient was mowing his lawn outdoors today.  Started having chest pain.  Radiating to his left arm.  Some shortness of breath.  Took some nitroglycerin with some relief.  Came to the ER for evaluation.  On arrival temp 90.6 heart rate 102 blood pressure 138/92 satting 95% on room air.  Magnesium 1.8 Sodium 136, potassium 2.9, BUN of 11, creatinine 1.1  White count 12.1, hemoglobin 14.8, platelets of 259  Initial troponin of 35, second troponin of 60  EKG shows normal sinus rhythm.  ED patient discussed the case with cardiology Dr. Melton Alar.  Instructed placed patient on heparin and the patient will be cath tomorrow.  Cardiology requested patient be admitted to the hospital service.

## 2022-07-20 NOTE — Progress Notes (Signed)
Patient c/o CP since cath of 4 out of 10 radiating to left arm, describes as pressure.  NTP placed per order with decrease of CP to 2/10. Dr. Rosemary Holms notified, continue to monitor and removed NTP in morning.

## 2022-07-20 NOTE — Assessment & Plan Note (Signed)
Last EF of 45% in 05-2021. Continue entresto, aldactone, farxiga,

## 2022-07-20 NOTE — Consult Note (Signed)
CARDIOLOGY CONSULT NOTE  Patient ID: Brandon Hogan MRN: [id number] DOB/AGE: 1970/11/06 51 y.o.  Admit date: 07/19/2022 Referring Physician  Dr Mahala Menghini Primary Physician:  [name], NP Reason for Consultation  unstable angina  Patient ID: Brandon Hogan, male    DOB: 10/09/1970, 52 y.o.   MRN: [id number]  Chief Complaint  Patient presents with   Chest Pain   Hypertension   HPI:    Brandon Hogan  is a 52 y.o. male whose past medical history and cardiovascular risk factors include: Non-obstructive CAD per CCTA, mild CAC (32.2AU, 81st percentile), chronic combined systolic and diastolic heart failure, hypertension, hyperlipidemia, family history of premature CAD, type 2 diabetes, former smoker, history of excessive alcohol use.    In February 2023 he presented to the office for evaluation of chest pain after stopping all his medications during COVID-19 pandemic for at least 1 year.   Patient was restarted on his medications and echocardiogram noted mildly reduced LVEF with global hypokinesis.  Thereafter he underwent coronary CTA which noted nonobstructive CAD with mild CAC.  However, CT FFR noted disease in the mid LCx which was in the indeterminate zone and therefore clinical correlation was required.   Clinically he did not have anginal chest pain and he wanted to hold off on invasive angiography.  However given his risk factors and strong family history of CAD I have asked him to keep a very low threshold for seeking medical attention if he has symptoms.   Unfortunately he was lost to follow-up and now presents to the office to reestablish care as he has been experiencing dyspnea on exertion.  Patient states that he is compliant with medical therapy.  Denies orthopnea or lower extremity swelling but does have PND at night.  He has been diagnosed with sleep apnea but is not able to tolerate the CPAP more than 1 hour per night.  No use of sublingual nitroglycerin  tablets. He continues to have nonspecific precordial discomfort, lasting for few minutes, intensity 4 out of 10, dullness/pressure-like, not brought on by effort related activities, does not resolve with resting, usually self-limited. Family history of premature CAD with both father and brother having MI in their 80s and they both went bypass surgery.  Mom died at the age of 46 due to myocardial infarction.  About 10 years ago he used to drink 12-14 bottles of beer on a daily basis for 4-5 years.  Currently works as an Scientist, product/process development at [employer].  He presented to the ED with chest pain and troponin elevation. At previous office visit, he wanted to hold off on cardiac catheterization, however, now with his symptoms he is agreeable to proceeding.  Past Medical History:  Diagnosis Date   Cardiomyopathy    Choledocholithiasis 09/16/2016   Diabetes mellitus without complication    Elevated LFTs 09/16/2016   Gallstone    Hyperlipidemia    Hypertension    Pancreatitis: Post ERCP mild pancreatitis 09/20/2016   Past Surgical History:  Procedure Laterality Date   CHOLECYSTECTOMY N/A 09/18/2016   Procedure: LAPAROSCOPIC CHOLECYSTECTOMY;  Surgeon: Almond Lint, MD;  Location: [facility] OR;  Service: General;  Laterality: N/A;   CHOLECYSTECTOMY  2018   ERCP N/A 09/18/2016   Procedure: ENDOSCOPIC RETROGRADE CHOLANGIOPANCREATOGRAPHY (ERCP);  Surgeon: Iva Boop, MD;  Location: [facility] OR;  Service: Endoscopy;  Laterality: N/A;   Social History   Tobacco Use   Smoking status: Former    Packs/day: 2.00    Years: 25.00  Additional pack years: 0.00    Total pack years: 50.00    Types: Cigarettes    Quit date: 05/17/2011    Years since quitting: 11.1   Smokeless tobacco: Never  Substance Use Topics   Alcohol use: Yes    Comment: rare    Family History  Problem Relation Age of Onset   Heart attack Mother    Gallbladder disease Father    Heart attack Father 35   Heart attack Brother  55    Marital Status: Married  ROS  Review of Systems  Cardiovascular:  Positive for chest pain.   Objective      07/20/2022    7:00 AM 07/20/2022    6:30 AM 07/20/2022    6:00 AM  Vitals with BMI  Systolic 125 117 459  Diastolic 80 76 85  Pulse 78 76 80    Blood pressure 125/80, pulse 78, temperature 98.2 F (36.8 C), temperature source Oral, resp. rate 15, height 6\' 2"  (1.88 m), weight 106.1 kg, SpO2 97 %.    Physical Exam Vitals reviewed.  HENT:     Head: Normocephalic and atraumatic.  Cardiovascular:     Rate and Rhythm: Normal rate and regular rhythm.     Pulses: Normal pulses.     Heart sounds: Normal heart sounds. No murmur heard. Pulmonary:     Effort: Pulmonary effort is normal.     Breath sounds: Normal breath sounds.  Abdominal:     General: Bowel sounds are normal.  Musculoskeletal:     Right lower leg: No edema.     Left lower leg: No edema.  Skin:    General: Skin is warm and dry.  Neurological:     Mental Status: He is alert.    Laboratory examination:   Recent Labs    08/21/21 0840 07/19/22 1957 07/20/22 0751  NA 138 136 138  K 4.1 2.9* 3.0*  CL 100 102 104  CO2 22 22 24   GLUCOSE 170* 205* 172*  BUN 16 11 26*  CREATININE 1.18 1.17 1.21  CALCIUM 9.9 9.4 8.7*  GFRNONAA  --  >60 >60   estimated creatinine clearance is 94.8 mL/min (by C-G formula based on SCr of 1.21 mg/dL).     Latest Ref Rng & Units 07/20/2022    7:51 AM 07/19/2022    7:57 PM 08/21/2021    8:40 AM  CMP  Glucose 70 - 99 mg/dL 977  414  239   BUN 6 - 20 mg/dL 26  11  16    Creatinine 0.61 - 1.24 mg/dL 5.32  0.23  3.43   Sodium 135 - 145 mmol/L 138  136  138   Potassium 3.5 - 5.1 mmol/L 3.0  2.9  4.1   Chloride 98 - 111 mmol/L 104  102  100   CO2 22 - 32 mmol/L 24  22  22    Calcium 8.9 - 10.3 mg/dL 8.7  9.4  9.9       Latest Ref Rng & Units 07/19/2022    7:57 PM 06/04/2021    8:38 AM 05/11/2021    4:45 PM  CBC  WBC 4.0 - 10.5 K/uL 12.1   11.6   Hemoglobin 13.0 - 17.0  g/dL 56.8  61.6  83.7   Hematocrit 39.0 - 52.0 % 43.3  46.0  43.8   Platelets 150 - 400 K/uL 259   278    Lipid Panel No results for input(s): "CHOL", "TRIG", "LDLCALC", "VLDL", "HDL", "CHOLHDL", "LDLDIRECT" in  the last 8760 hours.  HEMOGLOBIN A1C Lab Results  Component Value Date   HGBA1C 5.2 09/19/2016   MPG 103 09/19/2016   TSH No results for input(s): "TSH" in the last 8760 hours. BNP (last 3 results) No results for input(s): "BNP" in the last 8760 hours. Cardiac Panel (last 3 results) Recent Labs    07/19/22 1957 07/19/22 2210  TROPONINIHS 35* 60*     Medications and allergies  No Known Allergies   Current Meds  Medication Sig   allopurinol (ZYLOPRIM) 300 MG tablet Take 300 mg by mouth daily.   atorvastatin (LIPITOR) 40 MG tablet TAKE 1 TABLET(40 MG) BY MOUTH DAILY (Patient taking differently: Take 40 mg by mouth every evening.)   FARXIGA 10 MG TABS tablet TAKE 1 TABLET(10 MG) BY MOUTH DAILY BEFORE BREAKFAST (Patient taking differently: Take 10 mg by mouth daily.)   fenofibrate (TRICOR) 145 MG tablet Take 1 tablet (145 mg total) by mouth daily.   LORazepam (ATIVAN) 1 MG tablet Take 1 mg by mouth daily as needed for anxiety.   metFORMIN (GLUCOPHAGE-XR) 500 MG 24 hr tablet Take 1,000 mg by mouth 2 (two) times daily with a meal.   metoprolol succinate (TOPROL-XL) 50 MG 24 hr tablet TAKE 1 TABLET(50 MG) BY MOUTH DAILY WITH OR IMMEDIATELY FOLLOWING A MEAL (Patient taking differently: Take 100 mg by mouth daily.)   Multiple Vitamin (MULTIVITAMIN WITH MINERALS) TABS tablet Take 1 tablet by mouth daily.   nitroGLYCERIN (NITROSTAT) 0.4 MG SL tablet Place 1 tablet (0.4 mg total) under the tongue every 5 (five) minutes as needed for chest pain. If you require more than two tablets five minutes apart go to the nearest ER via EMS.   Omega-3 Fatty Acids (FISH OIL PO) Take 2 capsules by mouth daily.   sacubitril-valsartan (ENTRESTO) 97-103 MG Take 1 tablet by mouth 2 (two) times daily.    spironolactone (ALDACTONE) 25 MG tablet TAKE 1 TABLET(25 MG) BY MOUTH EVERY MORNING (Patient taking differently: Take 25 mg by mouth daily.)   venlafaxine XR (EFFEXOR-XR) 75 MG 24 hr capsule Take 75 mg by mouth daily with breakfast.    Scheduled Meds:  nitroGLYCERIN  0.5 inch Topical Q6H   potassium chloride  40 mEq Oral BID   Continuous Infusions:  heparin 1,400 Units/hr (07/20/22 0713)   PRN Meds:.   No intake/output data recorded. No intake/output data recorded.  Net IO Since Admission: No IO data has been entered for this period [07/20/22 1013]   Radiology:   Imaging results have been reviewed and DG Chest 2 View  Result Date: 07/19/2022 CLINICAL DATA:  Chest pain. EXAM: CHEST - 2 VIEW COMPARISON:  05/11/2021 FINDINGS: The cardiomediastinal contours are normal. The lungs are clear. Pulmonary vasculature is normal. No consolidation, pleural effusion, or pneumothorax. No acute osseous abnormalities are seen. IMPRESSION: Negative radiographs of the chest. Electronically Signed   By: Narda Rutherford M.D.   On: 07/19/2022 21:02    Cardiac Studies:   EKG: 07/10/2022: Normal sinus rhythm, 87 bpm, normal axis, without underlying ischemia or injury pattern.   Echocardiogram: 05/22/2021:  Left ventricle cavity is normal in size. Normal left ventricular wall thickness. Hypokinetic global wall motion. Normal diastolic filling pattern. Mildly depressed LV systolic function with visual EF 45-50%. Left atrial cavity is mildly dilated. Compared to 08/19/2018, LVEF previously reported at 50 to 55%, however on 08/11/2016, EF was reported to be 40 to 45%. Overall no significant change.   Stress Testing: No results found for this or  any previous visit from the past 1095 days.     Heart Catheterization: None   CCTA 06/18/2021: 1. Total coronary calcium score of 32.3. This was 81st percentile for age and sex matched control.   2. Normal coronary origin with right dominance.   3. CAD-RADS  = 2 Mild non-obstructive CAD. Left main: Patent. LAD: Minimal (0-24%) mixed plaque within the proximal / mid segment. LCx: Mild stenosis (25-49%) due to noncalcified plaque at mid LCX but accuracy is limited due to artifact. RCA: Mild stenosis (25-49%) due to mixed plaque proximal to mid RCA. 4. Study is sent for CT-FFR to further evaluate the LCX. Findings will be performed and reported separately. 5. No significant incidental findings.   CT-FFR Impression:  1. CT FFR analysis showed no significant stenosis in Left main, LAD, and RCA.  2. Possible hemodynamically significant stenosis within the mid-distal LCx, CT-FFR valves are within the indeterminate zone, clinical correlation required.     Assessment & Recommendations:   Unstable angina with CAD and CAC noted on CCTA  Plan for diagnostic LHC with possible intervention this afternoon with Dr Rosemary Holms. Maintain on heparin gtt. 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 History of significant alcohol consumption in the past. Medications reconciled. Increase Entresto 49/51 mg p.o. twice daily to Entresto 97/103 mg p.o. twice daily Patient just picked up her prescription for 90 days for 49/51 mg tablets.  I have asked her to take Entresto 49/51 mg 2 tabs twice daily and to have labs done in 1 week to reevaluate kidney function.  Once he runs out of these tablets and his renal function remained stable we will send in a prescription for Entresto 97/103 mg p.o. twice daily. Repeat echocardiogram to reevaluate LVEF since initiation of GDMT    Essential hypertension Restart home BP meds Reemphasized importance of low-salt diet.    Dyslipidemia Hypertriglyceridemia LDL currently at goal at 46 mg/dL. Triglyceride levels remain uncontrolled.    Non-insulin dependent type 2 diabetes mellitus (HCC) Currently on  metformin, Entresto, atorvastatin, Farxiga.    Sleep apnea: Reemphasized the importance of device compliance. Currently managed by Dr. Vickey Huger.     Clotilde Dieter, DO 07/20/2022, 10:13 AM Office: [phone]

## 2022-07-20 NOTE — Progress Notes (Signed)
Patient transferred from cath lab at 1718hrs, TR band in place with 9cc, level zero.  Reviewed post cath instructions with patient.  Will begin deflation of band.

## 2022-07-20 NOTE — Progress Notes (Signed)
Notified by CCMD pt had short burst of SVT at 2215. Per primary RN pt standing at sink and without complaints. Will continue to monitor. Dierdre Highman, RN

## 2022-07-20 NOTE — Progress Notes (Signed)
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 [name] after 3 years of being lost to follow-up 07/10/2022 revealed he was dyspneic on exertion and has been compliant on medical therapy  Admitted as per Dr. Imogene Burn with classic crescendo decrescendo pain--admitted placed on heparin, given nitro [place] [facility] assessed the patient--- underwent cath with right heart stent [employer] and has been stabilized by them  He is stable at this time can have a diet and if all remains stable we will go home on GDMT as directed by cardiology heparin as per them etc. etc.  No charge  Pleas Koch, MD [facility] 4:55 PM

## 2022-07-20 NOTE — H&P (Signed)
History and Physical    Brandon VANWYHE PYP:[id number] DOB: Jul 03, 1970 DOA: 07/19/2022  DOS: the patient was seen and examined on 07/19/2022  PCP: [name], NP   Patient coming from: Home  I have personally briefly reviewed patient's old medical records in [facility]  CC: chest pain HPI: 52 year old male history of cardiomyopathy EF of 45%, hypertension, type 2 diabetes without insulin, early family history of coronary disease with his mom dying in the 37s of an MI, his brother and his father both having MIs in their 79s, presents to the ER today with chest pain.  Patient was mowing his lawn outdoors today.  Started having chest pain.  Radiating to his left arm.  Some shortness of breath.  Took some nitroglycerin with some relief.  Came to the ER for evaluation.  On arrival temp 90.6 heart rate 102 blood pressure 138/92 satting 95% on room air.  Magnesium 1.8 Sodium 136, potassium 2.9, BUN of 11, creatinine 1.1  White count 12.1, hemoglobin 14.8, platelets of 259  Initial troponin of 35, second troponin of 60  EKG shows normal sinus rhythm.  ED patient discussed the case with cardiology Dr. Melton Alar.  Instructed placed patient on heparin and the patient will be cath tomorrow.  Cardiology requested patient be admitted to the hospital service.   ED Course: troponin 35, then 60.  Review of Systems:  Review of Systems  Constitutional: Negative.   HENT: Negative.    Eyes: Negative.   Respiratory:  Positive for shortness of breath.   Cardiovascular:  Positive for chest pain.       SOB Was sweating outside, but he was mowing the lawn. Radiation of CP down left arm.  Gastrointestinal: Negative.   Genitourinary: Negative.   Musculoskeletal: Negative.   Skin: Negative.   Neurological: Negative.   Endo/Heme/Allergies: Negative.   Psychiatric/Behavioral: Negative.    All other systems reviewed and are negative.   Past Medical History:  Diagnosis Date    Cardiomyopathy    Choledocholithiasis 09/16/2016   Diabetes mellitus without complication    Elevated LFTs 09/16/2016   Gallstone    Hyperlipidemia    Hypertension    Pancreatitis: Post ERCP mild pancreatitis 09/20/2016    Past Surgical History:  Procedure Laterality Date   CHOLECYSTECTOMY N/A 09/18/2016   Procedure: LAPAROSCOPIC CHOLECYSTECTOMY;  Surgeon: Almond Lint, MD;  Location: [facility] OR;  Service: General;  Laterality: N/A;   CHOLECYSTECTOMY  2018   ERCP N/A 09/18/2016   Procedure: ENDOSCOPIC RETROGRADE CHOLANGIOPANCREATOGRAPHY (ERCP);  Surgeon: Iva Boop, MD;  Location: [facility];  Service: Endoscopy;  Laterality: N/A;     reports that he quit smoking about 11 years ago. His smoking use included cigarettes. He has a 50.00 pack-year smoking history. He has never used smokeless tobacco. He reports current alcohol use. He reports that he does not use drugs.  No Known Allergies  Family History  Problem Relation Age of Onset   Heart attack Mother    Gallbladder disease Father    Heart attack Father 78   Heart attack Brother 42    Prior to Admission medications   Medication Sig Start Date End Date Taking? Authorizing Provider  allopurinol (ZYLOPRIM) 300 MG tablet Take 300 mg by mouth daily.   Yes [provider]  atorvastatin (LIPITOR) 40 MG tablet TAKE 1 TABLET(40 MG) BY MOUTH DAILY Patient taking differently: Take 40 mg by mouth every evening. 03/19/22  Yes Tolia, Sunit, DO  FARXIGA 10 MG TABS tablet TAKE  1 TABLET(10 MG) BY MOUTH DAILY BEFORE BREAKFAST Patient taking differently: Take 10 mg by mouth daily. 02/12/22  Yes Tolia, Sunit, DO  fenofibrate (TRICOR) 145 MG tablet Take 1 tablet (145 mg total) by mouth daily. 07/10/22 10/08/22 Yes Tolia, Sunit, DO  LORazepam (ATIVAN) 1 MG tablet Take 1 mg by mouth daily as needed for anxiety.   Yes [provider]  metFORMIN (GLUCOPHAGE-XR) 500 MG 24 hr tablet Take 1,000 mg by mouth 2 (two) times daily with a meal.  09/16/15  Yes [provider]  metoprolol succinate (TOPROL-XL) 50 MG 24 hr tablet TAKE 1 TABLET(50 MG) BY MOUTH DAILY WITH OR IMMEDIATELY FOLLOWING A MEAL Patient taking differently: Take 100 mg by mouth daily. 05/05/22  Yes Tolia, Sunit, DO  Multiple Vitamin (MULTIVITAMIN WITH MINERALS) TABS tablet Take 1 tablet by mouth daily.   Yes [provider]  nitroGLYCERIN (NITROSTAT) 0.4 MG SL tablet Place 1 tablet (0.4 mg total) under the tongue every 5 (five) minutes as needed for chest pain. If you require more than two tablets five minutes apart go to the nearest ER via EMS. 05/20/21 07/19/23 Yes Tolia, Sunit, DO  Omega-3 Fatty Acids (FISH OIL PO) Take 2 capsules by mouth daily.   Yes [provider]  sacubitril-valsartan (ENTRESTO) 97-103 MG Take 1 tablet by mouth 2 (two) times daily. 07/10/22 10/08/22 Yes Tolia, Sunit, DO  spironolactone (ALDACTONE) 25 MG tablet TAKE 1 TABLET(25 MG) BY MOUTH EVERY MORNING Patient taking differently: Take 25 mg by mouth daily. 05/13/22  Yes Tolia, Sunit, DO  venlafaxine XR (EFFEXOR-XR) 75 MG 24 hr capsule Take 75 mg by mouth daily with breakfast.   Yes [provider]    Physical Exam: Vitals:   07/19/22 2209 07/19/22 2300 07/20/22 0000 07/20/22 0030  BP:  (!) 147/92 (!) 144/93 (!) 137/94  Pulse:  84 80 82  Resp:  Temp: 98.2 F (36.8 C)     SpO2:  96% 96% 97%  Weight:      Height:        Physical Exam Vitals and nursing note reviewed.  Constitutional:      General: He is not in acute distress.    Appearance: Normal appearance. He is not ill-appearing, toxic-appearing or diaphoretic.  HENT:     Head: Normocephalic and atraumatic.     Nose: Nose normal.  Eyes:     General: No scleral icterus. Cardiovascular:     Rate and Rhythm: Normal rate and regular rhythm.     Pulses: Normal pulses.     Comments: Prior scar over right radial artery. Palpable radial pulse. Pt states scar is from laceration to his right  forearm. No hardware in right wrist or forearm. Pulmonary:     Effort: Pulmonary effort is normal.     Breath sounds: Normal breath sounds.  Abdominal:     General: Abdomen is flat. Bowel sounds are normal. There is no distension.     Palpations: Abdomen is soft.     Tenderness: There is no abdominal tenderness.  Skin:    General: Skin is warm and dry.     Capillary Refill: Capillary refill takes less than 2 seconds.  Neurological:     General: No focal deficit present.     Mental Status: He is alert and oriented to person, place, and time.      Labs on Admission: I have personally reviewed following labs and imaging studies  CBC: Recent Labs  Lab 07/19/22 1957  WBC 12.1*  HGB 14.8  HCT 43.3  MCV 86.1  PLT 259   Basic Metabolic Panel: Recent Labs  Lab 07/19/22 1957 07/19/22 2210  NA 136  --   K 2.9*  --   CL 102  --   CO2 22  --   GLUCOSE 205*  --   BUN 11  --   CREATININE 1.17  --   CALCIUM 9.4  --   MG  --  1.8   GFR: Estimated Creatinine Clearance: 98.1 mL/min (by C-G formula based on SCr of 1.17 mg/dL).  Cardiac Enzymes: Recent Labs  Lab 07/19/22 1957 07/19/22 2210  TROPONINIHS 35* 60*   Radiological Exams on Admission: I have personally reviewed images DG Chest 2 View  Result Date: 07/19/2022 CLINICAL DATA:  Chest pain. EXAM: CHEST - 2 VIEW COMPARISON:  05/11/2021 FINDINGS: The cardiomediastinal contours are normal. The lungs are clear. Pulmonary vasculature is normal. No consolidation, pleural effusion, or pneumothorax. No acute osseous abnormalities are seen. IMPRESSION: Negative radiographs of the chest. Electronically Signed   By: Narda Rutherford M.D.   On: 07/19/2022 21:02    EKG: My personal interpretation of EKG shows: NSR    Assessment/Plan Principal Problem:   Unstable angina Active Problems:   Essential hypertension   Controlled type 2 diabetes mellitus without complication, without long-term current use of insulin    Cardiomyopathy    Assessment and Plan: * Unstable angina Observation progressive bed. IV heparin. NTG paste. Cardiology consulted([facility]). NPO. Continue lipitor. Check lipid panel. Continue ASA.  Cardiomyopathy Last EF of 45% in 05-2021. Continue entresto, aldactone, farxiga,   Controlled type 2 diabetes mellitus without complication, without long-term current use of insulin Hold metformina. Add SSI.  Essential hypertension Continue with toprol-xl 25 mg daily.   DVT prophylaxis: IV heparin gtts Code Status: Full Code Family Communication: discussed with pt and wife Misty Stanley at bedside  Disposition Plan: return home  Consults called: EDP consulted cardiology Custovic  Admission status: Observation,  progressive   Carollee Herter, DO [facility] 07/20/2022, 12:59 AM

## 2022-07-20 NOTE — Progress Notes (Signed)
ANTICOAGULATION CONSULT NOTE  Pharmacy Consult for heparin Indication: chest pain/ACS  No Known Allergies  Patient Measurements: Height: 6\' 2"  (188 cm) Weight: 106.1 kg (234 lb) IBW/kg (Calculated) : 82.2 Heparin Dosing Weight: 100kg  Vital Signs: Temp: 98.2 F (36.8 C) (04/15 0612) Temp Source: Oral (04/15 0612) BP: 125/80 (04/15 0700) Pulse Rate: 78 (04/15 0700)  Labs: Recent Labs    07/19/22 1957 07/19/22 2210 07/20/22 0617  HGB 14.8  --   --   HCT 43.3  --   --   PLT 259  --   --   HEPARINUNFRC  --   --  0.32  CREATININE 1.17  --   --   TROPONINIHS 35* 60*  --      Estimated Creatinine Clearance: 98.1 mL/min (by C-G formula based on SCr of 1.17 mg/dL).   Medical History: Past Medical History:  Diagnosis Date   Cardiomyopathy    Choledocholithiasis 09/16/2016   Diabetes mellitus without complication    Elevated LFTs 09/16/2016   Gallstone    Hyperlipidemia    Hypertension    Pancreatitis: Post ERCP mild pancreatitis 09/20/2016    Assessment: 51yo male c/o CP radiating to LUE and associated w/ SOB, some relief w/ NTG x1, troponin elevated and rising >> on heparin.  Heparin level therapeutic (0.32) on infusion at 1400 units/hr. No bleeding noted.  Goal of Therapy:  Heparin level 0.3-0.7 units/ml Monitor platelets by anticoagulation protocol: Yes   Plan:  Continue heparin at 1400 units/hr F/u 6h confirmatory heparin level  Christoper Fabian, PharmD, BCPS Please see amion for complete clinical pharmacist phone list 07/20/2022,8:52 AM

## 2022-07-20 NOTE — H&P (View-Only) (Signed)
CARDIOLOGY CONSULT NOTE  Patient ID: [name] MRN: [id number] DOB/AGE: 1970/11/06 52 y.o.  Admit date: 07/19/2022 Referring Physician  Dr Mahala Menghini Primary Physician:  [name], NP Reason for Consultation  unstable angina  Patient ID: Brandon Hogan, male    DOB: 10/09/1970, 52 y.o.   MRN: [id number]  Chief Complaint  Patient presents with   Chest Pain   Hypertension   HPI:    Brandon Hogan  is a 52 y.o. male whose past medical history and cardiovascular risk factors include: Non-obstructive CAD per CCTA, mild CAC (32.2AU, 81st percentile), chronic combined systolic and diastolic heart failure, hypertension, hyperlipidemia, family history of premature CAD, type 2 diabetes, former smoker, history of excessive alcohol use.    In February 2023 he presented to the office for evaluation of chest pain after stopping all his medications during COVID-19 pandemic for at least 1 year.   Patient was restarted on his medications and echocardiogram noted mildly reduced LVEF with global hypokinesis.  Thereafter he underwent coronary CTA which noted nonobstructive CAD with mild CAC.  However, CT FFR noted disease in the mid LCx which was in the indeterminate zone and therefore clinical correlation was required.   Clinically he did not have anginal chest pain and he wanted to hold off on invasive angiography.  However given his risk factors and strong family history of CAD I have asked him to keep a very low threshold for seeking medical attention if he has symptoms.   Unfortunately he was lost to follow-up and now presents to the office to reestablish care as he has been experiencing dyspnea on exertion.  Patient states that he is compliant with medical therapy.  Denies orthopnea or lower extremity swelling but does have PND at night.  He has been diagnosed with sleep apnea but is not able to tolerate the CPAP more than 1 hour per night.  No use of sublingual nitroglycerin  tablets. He continues to have nonspecific precordial discomfort, lasting for few minutes, intensity 4 out of 10, dullness/pressure-like, not brought on by effort related activities, does not resolve with resting, usually self-limited. Family history of premature CAD with both father and brother having MI in their 80s and they both went bypass surgery.  Mom died at the age of 46 due to myocardial infarction.  About 10 years ago he used to drink 12-14 bottles of beer on a daily basis for 4-5 years.  Currently works as an Scientist, product/process development at [employer].  He presented to the ED with chest pain and troponin elevation. At previous office visit, he wanted to hold off on cardiac catheterization, however, now with his symptoms he is agreeable to proceeding.  Past Medical History:  Diagnosis Date   Cardiomyopathy    Choledocholithiasis 09/16/2016   Diabetes mellitus without complication    Elevated LFTs 09/16/2016   Gallstone    Hyperlipidemia    Hypertension    Pancreatitis: Post ERCP mild pancreatitis 09/20/2016   Past Surgical History:  Procedure Laterality Date   CHOLECYSTECTOMY N/A 09/18/2016   Procedure: LAPAROSCOPIC CHOLECYSTECTOMY;  Surgeon: Almond Lint, MD;  Location: [facility] OR;  Service: General;  Laterality: N/A;   CHOLECYSTECTOMY  2018   ERCP N/A 09/18/2016   Procedure: ENDOSCOPIC RETROGRADE CHOLANGIOPANCREATOGRAPHY (ERCP);  Surgeon: Iva Boop, MD;  Location: [facility] OR;  Service: Endoscopy;  Laterality: N/A;   Social History   Tobacco Use   Smoking status: Former    Packs/day: 2.00    Years: 25.00  Additional pack years: 0.00    Total pack years: 50.00    Types: Cigarettes    Quit date: 05/17/2011    Years since quitting: 11.1   Smokeless tobacco: Never  Substance Use Topics   Alcohol use: Yes    Comment: rare    Family History  Problem Relation Age of Onset   Heart attack Mother    Gallbladder disease Father    Heart attack Father 35   Heart attack Brother  55    Marital Status: Married  ROS  Review of Systems  Cardiovascular:  Positive for chest pain.   Objective      07/20/2022    7:00 AM 07/20/2022    6:30 AM 07/20/2022    6:00 AM  Vitals with BMI  Systolic 125 117 459  Diastolic 80 76 85  Pulse 78 76 80    Blood pressure 125/80, pulse 78, temperature 98.2 F (36.8 C), temperature source Oral, resp. rate 15, height 6\' 2"  (1.88 m), weight 106.1 kg, SpO2 97 %.    Physical Exam Vitals reviewed.  HENT:     Head: Normocephalic and atraumatic.  Cardiovascular:     Rate and Rhythm: Normal rate and regular rhythm.     Pulses: Normal pulses.     Heart sounds: Normal heart sounds. No murmur heard. Pulmonary:     Effort: Pulmonary effort is normal.     Breath sounds: Normal breath sounds.  Abdominal:     General: Bowel sounds are normal.  Musculoskeletal:     Right lower leg: No edema.     Left lower leg: No edema.  Skin:    General: Skin is warm and dry.  Neurological:     Mental Status: He is alert.    Laboratory examination:   Recent Labs    08/21/21 0840 07/19/22 1957 07/20/22 0751  NA 138 136 138  K 4.1 2.9* 3.0*  CL 100 102 104  CO2 22 22 24   GLUCOSE 170* 205* 172*  BUN 16 11 26*  CREATININE 1.18 1.17 1.21  CALCIUM 9.9 9.4 8.7*  GFRNONAA  --  >60 >60   estimated creatinine clearance is 94.8 mL/min (by C-G formula based on SCr of 1.21 mg/dL).     Latest Ref Rng & Units 07/20/2022    7:51 AM 07/19/2022    7:57 PM 08/21/2021    8:40 AM  CMP  Glucose 70 - 99 mg/dL 977  414  239   BUN 6 - 20 mg/dL 26  11  16    Creatinine 0.61 - 1.24 mg/dL 5.32  0.23  3.43   Sodium 135 - 145 mmol/L 138  136  138   Potassium 3.5 - 5.1 mmol/L 3.0  2.9  4.1   Chloride 98 - 111 mmol/L 104  102  100   CO2 22 - 32 mmol/L 24  22  22    Calcium 8.9 - 10.3 mg/dL 8.7  9.4  9.9       Latest Ref Rng & Units 07/19/2022    7:57 PM 06/04/2021    8:38 AM 05/11/2021    4:45 PM  CBC  WBC 4.0 - 10.5 K/uL 12.1   11.6   Hemoglobin 13.0 - 17.0  g/dL 56.8  61.6  83.7   Hematocrit 39.0 - 52.0 % 43.3  46.0  43.8   Platelets 150 - 400 K/uL 259   278    Lipid Panel No results for input(s): "CHOL", "TRIG", "LDLCALC", "VLDL", "HDL", "CHOLHDL", "LDLDIRECT" in  the last 8760 hours.  HEMOGLOBIN A1C Lab Results  Component Value Date   HGBA1C 5.2 09/19/2016   MPG 103 09/19/2016   TSH No results for input(s): "TSH" in the last 8760 hours. BNP (last 3 results) No results for input(s): "BNP" in the last 8760 hours. Cardiac Panel (last 3 results) Recent Labs    07/19/22 1957 07/19/22 2210  TROPONINIHS 35* 60*     Medications and allergies  No Known Allergies   Current Meds  Medication Sig   allopurinol (ZYLOPRIM) 300 MG tablet Take 300 mg by mouth daily.   atorvastatin (LIPITOR) 40 MG tablet TAKE 1 TABLET(40 MG) BY MOUTH DAILY (Patient taking differently: Take 40 mg by mouth every evening.)   FARXIGA 10 MG TABS tablet TAKE 1 TABLET(10 MG) BY MOUTH DAILY BEFORE BREAKFAST (Patient taking differently: Take 10 mg by mouth daily.)   fenofibrate (TRICOR) 145 MG tablet Take 1 tablet (145 mg total) by mouth daily.   LORazepam (ATIVAN) 1 MG tablet Take 1 mg by mouth daily as needed for anxiety.   metFORMIN (GLUCOPHAGE-XR) 500 MG 24 hr tablet Take 1,000 mg by mouth 2 (two) times daily with a meal.   metoprolol succinate (TOPROL-XL) 50 MG 24 hr tablet TAKE 1 TABLET(50 MG) BY MOUTH DAILY WITH OR IMMEDIATELY FOLLOWING A MEAL (Patient taking differently: Take 100 mg by mouth daily.)   Multiple Vitamin (MULTIVITAMIN WITH MINERALS) TABS tablet Take 1 tablet by mouth daily.   nitroGLYCERIN (NITROSTAT) 0.4 MG SL tablet Place 1 tablet (0.4 mg total) under the tongue every 5 (five) minutes as needed for chest pain. If you require more than two tablets five minutes apart go to the nearest ER via EMS.   Omega-3 Fatty Acids (FISH OIL PO) Take 2 capsules by mouth daily.   sacubitril-valsartan (ENTRESTO) 97-103 MG Take 1 tablet by mouth 2 (two) times daily.    spironolactone (ALDACTONE) 25 MG tablet TAKE 1 TABLET(25 MG) BY MOUTH EVERY MORNING (Patient taking differently: Take 25 mg by mouth daily.)   venlafaxine XR (EFFEXOR-XR) 75 MG 24 hr capsule Take 75 mg by mouth daily with breakfast.    Scheduled Meds:  nitroGLYCERIN  0.5 inch Topical Q6H   potassium chloride  40 mEq Oral BID   Continuous Infusions:  heparin 1,400 Units/hr (07/20/22 0713)   PRN Meds:.   No intake/output data recorded. No intake/output data recorded.  Net IO Since Admission: No IO data has been entered for this period [07/20/22 1013]   Radiology:   Imaging results have been reviewed and DG Chest 2 View  Result Date: 07/19/2022 CLINICAL DATA:  Chest pain. EXAM: CHEST - 2 VIEW COMPARISON:  05/11/2021 FINDINGS: The cardiomediastinal contours are normal. The lungs are clear. Pulmonary vasculature is normal. No consolidation, pleural effusion, or pneumothorax. No acute osseous abnormalities are seen. IMPRESSION: Negative radiographs of the chest. Electronically Signed   By: Narda Rutherford M.D.   On: 07/19/2022 21:02    Cardiac Studies:   EKG: 07/10/2022: Normal sinus rhythm, 87 bpm, normal axis, without underlying ischemia or injury pattern.   Echocardiogram: 05/22/2021:  Left ventricle cavity is normal in size. Normal left ventricular wall thickness. Hypokinetic global wall motion. Normal diastolic filling pattern. Mildly depressed LV systolic function with visual EF 45-50%. Left atrial cavity is mildly dilated. Compared to 08/19/2018, LVEF previously reported at 50 to 55%, however on 08/11/2016, EF was reported to be 40 to 45%. Overall no significant change.   Stress Testing: No results found for this or  any previous visit from the past 1095 days.     Heart Catheterization: None   CCTA 06/18/2021: 1. Total coronary calcium score of 32.3. This was 81st percentile for age and sex matched control.   2. Normal coronary origin with right dominance.   3. CAD-RADS  = 2 Mild non-obstructive CAD. Left main: Patent. LAD: Minimal (0-24%) mixed plaque within the proximal / mid segment. LCx: Mild stenosis (25-49%) due to noncalcified plaque at mid LCX but accuracy is limited due to artifact. RCA: Mild stenosis (25-49%) due to mixed plaque proximal to mid RCA. 4. Study is sent for CT-FFR to further evaluate the LCX. Findings will be performed and reported separately. 5. No significant incidental findings.   CT-FFR Impression:  1. CT FFR analysis showed no significant stenosis in Left main, LAD, and RCA.  2. Possible hemodynamically significant stenosis within the mid-distal LCx, CT-FFR valves are within the indeterminate zone, clinical correlation required.     Assessment & Recommendations:   Unstable angina with CAD and CAC noted on CCTA  Plan for diagnostic LHC with possible intervention this afternoon with Dr Patwardhan. Maintain on heparin gtt. 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 History of significant alcohol consumption in the past. Medications reconciled. Increase Entresto 49/51 mg p.o. twice daily to Entresto 97/103 mg p.o. twice daily Patient just picked up her prescription for 90 days for 49/51 mg tablets.  I have asked her to take Entresto 49/51 mg 2 tabs twice daily and to have labs done in 1 week to reevaluate kidney function.  Once he runs out of these tablets and his renal function remained stable we will send in a prescription for Entresto 97/103 mg p.o. twice daily. Repeat echocardiogram to reevaluate LVEF since initiation of GDMT    Essential hypertension Restart home BP meds Reemphasized importance of low-salt diet.    Dyslipidemia Hypertriglyceridemia LDL currently at goal at 46 mg/dL. Triglyceride levels remain uncontrolled.    Non-insulin dependent type 2 diabetes mellitus (HCC) Currently on  metformin, Entresto, atorvastatin, Farxiga.    Sleep apnea: Reemphasized the importance of device compliance. Currently managed by Dr. Dohmeier.     Juri Dinning, DO 07/20/2022, 10:13 AM Office: [phone]  

## 2022-07-21 ENCOUNTER — Other Ambulatory Visit (HOSPITAL_COMMUNITY): Payer: Self-pay

## 2022-07-21 ENCOUNTER — Telehealth: Payer: Self-pay

## 2022-07-21 ENCOUNTER — Encounter (HOSPITAL_COMMUNITY): Payer: Self-pay | Admitting: Cardiology

## 2022-07-21 DIAGNOSIS — I110 Hypertensive heart disease with heart failure: Secondary | ICD-10-CM | POA: Diagnosis not present

## 2022-07-21 DIAGNOSIS — E119 Type 2 diabetes mellitus without complications: Secondary | ICD-10-CM | POA: Diagnosis not present

## 2022-07-21 DIAGNOSIS — I25110 Atherosclerotic heart disease of native coronary artery with unstable angina pectoris: Secondary | ICD-10-CM | POA: Diagnosis not present

## 2022-07-21 DIAGNOSIS — I5042 Chronic combined systolic (congestive) and diastolic (congestive) heart failure: Secondary | ICD-10-CM | POA: Diagnosis not present

## 2022-07-21 DIAGNOSIS — I200 Unstable angina: Secondary | ICD-10-CM | POA: Diagnosis not present

## 2022-07-21 LAB — GLUCOSE, CAPILLARY
Glucose-Capillary: 186 mg/dL — ABNORMAL HIGH (ref 70–99)
Glucose-Capillary: 163 mg/dL — ABNORMAL HIGH (ref 70–99)

## 2022-07-21 LAB — CBC
HCT: 38 % — ABNORMAL LOW (ref 39.0–52.0)
Hemoglobin: 13.1 g/dL (ref 13.0–17.0)
MCH: 30 pg (ref 26.0–34.0)
MCHC: 34.5 g/dL (ref 30.0–36.0)
MCV: 87 fL (ref 80.0–100.0)
Platelets: 204 10*3/uL (ref 150–400)
RBC: 4.37 MIL/uL (ref 4.22–5.81)
RDW: 14.3 % (ref 11.5–15.5)
WBC: 7.7 10*3/uL (ref 4.0–10.5)
nRBC: 0 % (ref 0.0–0.2)

## 2022-07-21 LAB — BASIC METABOLIC PANEL
Anion gap: 13 (ref 5–15)
BUN: 13 mg/dL (ref 6–20)
CO2: 25 mmol/L (ref 22–32)
Calcium: 9 mg/dL (ref 8.9–10.3)
Chloride: 102 mmol/L (ref 98–111)
Creatinine, Ser: 1.15 mg/dL (ref 0.61–1.24)
GFR, Estimated: >60 mL/min (ref >60)
Glucose, Bld: 138 mg/dL — ABNORMAL HIGH (ref 70–99)
Potassium: 3 mmol/L — ABNORMAL LOW (ref 3.5–5.1)
Sodium: 140 mmol/L (ref 135–145)

## 2022-07-21 MED ORDER — METFORMIN HCL ER 500 MG PO TB24: 1000.0000 mg | ORAL_TABLET | Freq: Two times a day (BID) | ORAL | 4 refills | Status: AC

## 2022-07-21 MED ORDER — ATORVASTATIN CALCIUM 80 MG PO TABS: 80.0000 mg | ORAL_TABLET | Freq: Every day | ORAL | Status: DC

## 2022-07-21 MED ORDER — SACUBITRIL-VALSARTAN 97-103 MG PO TABS
1.0000 | ORAL_TABLET | Freq: Two times a day (BID) | ORAL | Status: DC
  Filled 2022-07-21: qty 1

## 2022-07-21 MED ORDER — POTASSIUM CHLORIDE CRYS ER 20 MEQ PO TBCR
40.0000 meq | EXTENDED_RELEASE_TABLET | Freq: Once | ORAL | Status: AC
  Administered 2022-07-21: 40 meq via ORAL
  Filled 2022-07-21: qty 2

## 2022-07-21 MED ORDER — METOPROLOL SUCCINATE ER 50 MG PO TB24: 50.0000 mg | ORAL_TABLET | Freq: Every day | ORAL | Status: DC

## 2022-07-21 MED ORDER — ATORVASTATIN CALCIUM 80 MG PO TABS
80.0000 mg | ORAL_TABLET | Freq: Every day | ORAL | 11 refills | Status: DC
  Filled 2022-07-21: qty 30, 30d supply, fill #0

## 2022-07-21 MED ORDER — TICAGRELOR 90 MG PO TABS
90.0000 mg | ORAL_TABLET | Freq: Two times a day (BID) | ORAL | 11 refills | Status: DC
  Filled 2022-07-21: qty 60, 30d supply, fill #0

## 2022-07-21 MED ORDER — SPIRONOLACTONE 25 MG PO TABS: 25.0000 mg | ORAL_TABLET | Freq: Every day | ORAL | Status: DC

## 2022-07-21 NOTE — Progress Notes (Signed)
Paged Dr Loney Loh re: k level of 3.0. Await response

## 2022-07-21 NOTE — Progress Notes (Signed)
CARDIAC REHAB PHASE I   PRE:  Rate/Rhythm: 93 SR  BP:  Sitting: 137/92      SaO2: 99 RA  MODE:  Ambulation: 150 ft   POST:  Rate/Rhythm: 100 SR  BP:  Sitting: 144/100      SaO2: 98 RA  Pt ambulated in hall independently. Tolerated well with no CP, dizziness, or SOB. Returned to bed with call bell and bedside table in reach. Post MI education including sit care, restrictions, risk factors, MI booklet, diabetic heart healthy diet, exercise guidelines, antiplatelet therapy importance and CRP2 reviewed. All questions and concerns addressed will refer to [facility] for CRP2. Plan for discharge later today.  0900-1000   Woodroe Chen, RN BSN 07/21/2022 9:49 AM

## 2022-07-21 NOTE — Telephone Encounter (Signed)
Patient is wanting to clarify the dosage of the metoprolol he is supposed to be taking. He says he just left the hospital and the discharge papers say for him to take 50 mg but his PCP gave him 100 mg Rx.

## 2022-07-21 NOTE — Discharge Summary (Signed)
Physician Discharge Summary  Brandon Hogan:[id number] DOB: Oct 08, 1970 DOA: 07/19/2022  PCP: Brandon Galas, NP  Admit date: 07/19/2022 Discharge date: 07/21/2022  Time spent: 40 minutes  Recommendations for Outpatient Follow-up:  Ticagrelor is the new med: In addition to high-dose of statin this hospitalization Needs Chem-7 in 1 week at either PCP or Dr. Tessa Hogan office-placed on several new medications for secondary prevention such as Entresto in addition to his prior to admission Aldactone and need to check K Outpatient low-dose CT scan to be ordered in the outpatient setting as he was a prior smoker Routine preventive care as an outpatient  Discharge Diagnoses:  MAIN problem for hospitalization   Unstable angina status post DES this hospitalization Dr. 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 Odis Hogan after 3 years of being lost to follow-up 07/10/2022 revealed  he was dyspneic on exertion and has been compliant on medical therapy  He had some chest pain which prompted him to be placed on heparin Nitropaste on admission  Decision was made to cath patient by cardiology and he had a Synergy DES placed via right wrist access Post procedure on 4/16 he looked good-was ambulating fairly well-he had no further chest pain and cardiac rehab phase 1 was started on him He was seen by cardiology and cleared for discharge after adjustment of meds as per [facility] below  Because he had a cath he should resume  his metformin in several days and then have labs to check his potassium and renal function in the outpatient setting CC Dr. Odis Hogan  Procedures: Cath 4/15-[place]  LM: Normal LAD: No significant disease          Diag 2 small to medium caliber. Focal mid 70% stenosis Lcx: Mid long 70% stenosis, RFR 0.94 (physiologically non-significant) RCA: Prox ulcerated 80% stenosis          Mid focal 50% stenosis   Successful OCT guided percutaneous coronary intervention prox RCA     Direct stent placement 4.0 X 16 mm Synergy drug-eluting stent     Post dilatation with 4.5X12 mm [place] balloon ay 16 atm   OCT MLA 3 mm2-->MSA 11 mm2  Discharge Exam: Vitals:   07/21/22 0458 07/21/22 0820  BP: 124/75 (!) 137/92  Pulse: 83 89  Resp: 18 19  Temp: 98.1 F (36.7 C) 97.8 F (36.6 C)  SpO2: 98% 97%    Subj on day of d/c   No pain other than in his right wrist No chest pain although some heaviness earlier today Seems comfortable hungry   General Exam on discharge  EOMI NCAT no focal Chest clear no wheeze rales rhonchi Abdomen soft No lower extremity edema Right radial noted Multiple tattoos  Discharge Instructions   Discharge Instructions     AMB Referral to Cardiac Rehabilitation - Phase II   Complete by: As directed    Diagnosis: NSTEMI   After initial evaluation and assessments completed: Virtual Based Care may be provided alone or in conjunction with Phase 2 Cardiac Rehab  based on patient barriers.: Yes   Intensive Cardiac Rehabilitation (ICR) [facility] location only OR Traditional Cardiac Rehabilitation (TCR) *If criteria for ICR are not met will enroll in TCR [facility] only): Yes   Ambulatory Referral for Lung Cancer Scre   Complete by: As directed    Diet - low sodium heart healthy   Complete by: As directed    Discharge instructions   Complete by: As directed    Please make sure that you take your new medication ticagrelor/Brilinta for a whole year in addition to the aspirin that you  are already taking-no dosage changes and increases on some of your meds like your cholesterol medicine and stay off high-fat diet As an outpatient because you are a smoker you may require an outpatient CT chest to make sure there is nothing in your lungs although this can be done later on this year   Do not take your metformin until the morning of 4/18 because you just had a cardiac catheterization  It would be a good idea to get labs in about a week or so @ Dr Brandon Hogan office and then maybe in a month to personally follow-up with him-  Best of luck and have a good summer   Increase activity slowly   Complete by: As directed       Allergies as of 07/21/2022   No Known Allergies      Medication List     STOP taking these medications    LORazepam 1 MG tablet Commonly known as: ATIVAN   nitroGLYCERIN 0.4 MG SL tablet Commonly known as: Nitrostat       TAKE these medications    allopurinol 300 MG tablet Commonly known as: ZYLOPRIM Take 300 mg by mouth daily.   atorvastatin 80 MG tablet Commonly known as: LIPITOR Take 1 tablet (80 mg total) by mouth daily. What changed:  medication strength See the new instructions.   Entresto 97-103 MG Generic drug: sacubitril-valsartan Take 1 tablet by mouth 2 (two) times daily.   Farxiga 10 MG Tabs tablet Generic drug: dapagliflozin propanediol TAKE 1 TABLET(10 MG) BY MOUTH DAILY BEFORE BREAKFAST What changed: See the new instructions.   fenofibrate 145 MG tablet Commonly known as: Tricor Take 1 tablet (145 mg total) by mouth daily.   FISH OIL PO Take 2 capsules by mouth daily.   metFORMIN 500 MG 24 hr tablet Commonly known as: GLUCOPHAGE-XR Take 2 tablets (1,000 mg total) by mouth 2 (two) times daily with a meal. Start taking on: July 23, 2022 What changed: These instructions start on July 23, 2022. If you are unsure what to do until then, ask your doctor or other care provider.   metoprolol succinate 50 MG 24 hr  tablet Commonly known as: TOPROL-XL TAKE 1 TABLET(50 MG) BY MOUTH DAILY WITH OR IMMEDIATELY FOLLOWING A MEAL What changed: See the new instructions.   multivitamin with minerals Tabs tablet Take 1 tablet by mouth daily.   spironolactone 25 MG tablet Commonly known as: ALDACTONE TAKE 1 TABLET(25 MG) BY MOUTH EVERY MORNING What changed: See the new instructions.   ticagrelor 90 MG Tabs tablet Commonly known as: BRILINTA Take 1 tablet (90 mg total) by mouth 2 (two) times daily.   venlafaxine XR 75 MG 24 hr capsule Commonly known as: EFFEXOR-XR Take 75 mg by mouth daily with breakfast.       No Known Allergies    The results of significant diagnostics from this hospitalization (including imaging, microbiology, ancillary and laboratory) are  listed below for reference.    Significant Diagnostic Studies: CARDIAC CATHETERIZATION  Result Date: 07/20/2022 Images from the original result were not included. LM: Normal LAD: No significant disease          Diag 2 small to medium caliber. Focal mid 70% stenosis Lcx: Mid long 70% stenosis, RFR 0.94 (physiologically non-significant) RCA: Prox ulcerated 80% stenosis          Mid focal 50% stenosis Successful OCT guided percutaneous coronary intervention prox RCA     Direct stent placement 4.0 X 16 mm Synergy drug-eluting stent     Post dilatation with 4.5X12 mm [place] balloon ay 16 atm OCT MLA 3 mm2-->MSA 11 mm2 Elder Negus, MD Pager: [phone] Office: [phone]   DG Chest 2 View  Result Date: 07/19/2022 CLINICAL DATA:  Chest pain. EXAM: CHEST - 2 VIEW COMPARISON:  05/11/2021 FINDINGS: The cardiomediastinal contours are normal. The lungs are clear. Pulmonary vasculature is normal. No consolidation, pleural effusion, or pneumothorax. No acute osseous abnormalities are seen. IMPRESSION: Negative radiographs of the chest. Electronically Signed   By: Narda Rutherford M.D.   On: 07/19/2022 21:02    Microbiology: No results found for this  or any previous visit (from the past 240 hour(s)).   Labs: Basic Metabolic Panel: Recent Labs  Lab 07/19/22 1957 07/19/22 2210 07/20/22 0751 07/21/22 0213  NA 136  --  138 140  K 2.9*  --  3.0* 3.0*  CL 102  --  104 102  CO2 22  --  24 25  GLUCOSE 205*  --  172* 138*  BUN 11  --  26* 13  CREATININE 1.17  --  1.21 1.15  CALCIUM 9.4  --  8.7* 9.0  MG  --  1.8  --   --    Liver Function Tests: No results for input(s): "AST", "ALT", "ALKPHOS", "BILITOT", "PROT", "ALBUMIN" in the last 168 hours. No results for input(s): "LIPASE", "AMYLASE" in the last 168 hours. No results for input(s): "AMMONIA" in the last 168 hours. CBC: Recent Labs  Lab 07/19/22 1957 07/20/22 1844 07/21/22 0213  WBC 12.1* 9.2 7.7  HGB 14.8 14.9 13.1  HCT 43.3 42.9 38.0*  MCV 86.1 86.1 87.0  PLT 259 232 204   Cardiac Enzymes: No results for input(s): "CKTOTAL", "CKMB", "CKMBINDEX", "TROPONINI" in the last 168 hours. BNP: BNP (last 3 results) No results for input(s): "BNP" in the last 8760 hours.  ProBNP (last 3 results) No results for input(s): "PROBNP" in the last 8760 hours.  CBG: Recent Labs  Lab 07/20/22 1744 07/20/22 2216 07/21/22 0823  GLUCAP 182* 202* 163*       Signed:  Rhetta Mura MD   [facility] 07/21/2022, 9:35 AM

## 2022-07-21 NOTE — TOC Benefit Eligibility Note (Signed)
Patient Product/process development scientist completed.    The patient is currently admitted and upon discharge could be taking Brilinta 90 mg.  The current 30 day co-pay is $35.00.   The patient is insured through [employer]   This test claim was processed through [employer]- copay amounts may vary at other pharmacies due to [employer], or as the patient moves through the different stages of their insurance plan.  Roland Earl, CPHT Pharmacy Patient Advocate Specialist [facility] Patient Advocate Team Direct Number: [phone]  Fax: [phone]

## 2022-07-21 NOTE — Progress Notes (Signed)
Progress Note  Patient Name: Brandon Hogan MRN: [id number] DOB: [birth year] Date of Encounter: 07/22/2022  Attending physician: No att. providers found Primary care provider: [name], NP Primary Cardiologist: [name], DO, [facility]  Subjective: Brandon Hogan is a 52 y.o. Caucasian male who was seen and examined at bedside  No chest pain status post heart catheterization. No postprocedural complications. Patient eager to go home. Case discussed and reviewed with his nurse.  Objective: Telemetry:  Overnight telemetry shows sinus rhythm without ectopy, which I personally reviewed.   Physical examination: PHYSICAL EXAM: Vitals:   07/20/22 2000 07/20/22 2324 07/21/22 0458 07/21/22 0820  BP: (!) 142/85 (!) 131/90 124/75 (!) 137/92  Pulse: (!) 103 89 83 89  Resp: 18 20 18 19   Temp: 98.3 F (36.8 C) 97.9 F (36.6 C) 98.1 F (36.7 C) 97.8 F (36.6 C)  TempSrc: Oral Oral Oral Oral  SpO2: 96% 96% 98% 97%  Weight:      Height:        Physical Exam  Constitutional: No distress.  Age appropriate, hemodynamically stable.   Neck: No JVD present.  Cardiovascular: Normal rate, regular rhythm, S1 normal, S2 normal, intact distal pulses and normal pulses. Exam reveals no gallop, no S3 and no S4.  No murmur heard. Pulses:      Dorsalis pedis pulses are 2+ on the right side and 2+ on the left side.       Posterior tibial pulses are 2+ on the right side and 2+ on the left side.  Pulmonary/Chest: Effort normal and breath sounds normal. No stridor. He has no wheezes. He has no rales.  Abdominal: Soft. Bowel sounds are normal. He exhibits no distension. There is no abdominal tenderness.  Musculoskeletal:        General: No edema.     Cervical back: Neck supple.  Neurological: He is alert and oriented to person, place, and time. He has intact cranial nerves (2-12).  Skin: Skin is warm and moist.   Lab Results: Chemistry Recent Labs  Lab 07/19/22 1957 07/20/22 0751  07/21/22 0213  NA 136 138 140  K 2.9* 3.0* 3.0*  CL 102 104 102  CO2 22 24 25   GLUCOSE 205* 172* 138*  BUN 11 26* 13  CREATININE 1.17 1.21 1.15  CALCIUM 9.4 8.7* 9.0  GFRNONAA >60 >60 >60  ANIONGAP 12 10 13     Hematology Recent Labs  Lab 07/19/22 1957 07/20/22 1844 07/21/22 0213  WBC 12.1* 9.2 7.7  RBC 5.03 4.98 4.37  HGB 14.8 14.9 13.1  HCT 43.3 42.9 38.0*  MCV 86.1 86.1 87.0  MCH 29.4 29.9 30.0  MCHC 34.2 34.7 34.5  RDW 14.1 14.5 14.3  PLT 259 232 204   High Sensitivity Troponin:   Recent Labs  Lab 07/19/22 1957 07/19/22 2210  TROPONINIHS 35* 60*     Cardiac EnzymesNo results for input(s): "TROPONINI" in the last 168 hours. No results for input(s): "TROPIPOC" in the last 168 hours.  BNPNo results for input(s): "BNP", "PROBNP" in the last 168 hours.  DDimer No results for input(s): "DDIMER" in the last 168 hours.  Hemoglobin A1c:  Lab Results  Component Value Date   HGBA1C 5.2 09/19/2016   MPG 103 09/19/2016   TSH No results for input(s): "TSH" in the last 8760 hours. Lipid Panel  Lab Results  Component Value Date   CHOL 169 07/20/2022   HDL 30 (L) 07/20/2022   LDLCALC 65 07/20/2022   LDLDIRECT 88 07/01/2021  TRIG 371 (H) 07/20/2022   CHOLHDL 5.6 07/20/2022   Drugs of Abuse  No results found for: "LABOPIA", "COCAINSCRNUR", "LABBENZ", "AMPHETMU", "THCU", "LABBARB"    Imaging: No results found.  CARDIAC DATABASE: EKG: 07/10/2022: Normal sinus rhythm, 87 bpm, normal axis, without underlying ischemia or injury pattern.   Echocardiogram: 05/22/2021:  Left ventricle cavity is normal in size. Normal left ventricular wall thickness. Hypokinetic global wall motion. Normal diastolic filling pattern. Mildly depressed LV systolic function with visual EF 45-50%. Left atrial cavity is mildly dilated. Compared to 08/19/2018, LVEF previously reported at 50 to 55%, however on 08/11/2016, EF was reported to be 40 to 45%. Overall no significant change.     CCTA 06/18/2021: 1. Total coronary calcium score of 32.3. This was 81st percentile for age and sex matched control.   2. Normal coronary origin with right dominance.   3. CAD-RADS = 2 Mild non-obstructive CAD. Left main: Patent. LAD: Minimal (0-24%) mixed plaque within the proximal / mid segment. LCx: Mild stenosis (25-49%) due to noncalcified plaque at mid LCX but accuracy is limited due to artifact. RCA: Mild stenosis (25-49%) due to mixed plaque proximal to mid RCA. 4. Study is sent for CT-FFR to further evaluate the LCX. Findings will be performed and reported separately. 5. No significant incidental findings.   CT-FFR Impression:  1. CT FFR analysis showed no significant stenosis in Left main, LAD, and RCA.  2. Possible hemodynamically significant stenosis within the mid-distal LCx, CT-FFR valves are within the indeterminate zone, clinical correlation required.    Recommendations: If symptoms persist despite uptitration of medical therapy consider invasive angiography to further evaluate the LCX.  Heart Catheterization: 07/20/2022 LM: Normal LAD: No significant disease          Diag 2 small to medium caliber. Focal mid 70% stenosis Lcx: Mid long 70% stenosis, RFR 0.94 (physiologically non-significant) RCA: Prox ulcerated 80% stenosis          Mid focal 50% stenosis   Successful OCT guided percutaneous coronary intervention prox RCA     Direct stent placement 4.0 X 16 mm Synergy drug-eluting stent     Post dilatation with 4.5X12 mm [place] balloon ay 16 atm   OCT MLA 3 mm2-->MSA 11 mm2     IMPRESSION & RECOMMENDATIONS: Brandon Hogan is a 52 y.o. Caucasian male whose past medical history and cardiac risk factors include: Established CAD status post PCI to the RCA, mild CAC (32.2AU, 81st percentile), chronic combined systolic and diastolic heart failure, hypertension, hyperlipidemia, family history of premature CAD, type 2 diabetes, former smoker, history of excessive  alcohol use.   Impression/recommendations: NSTEMI Establish coronary artery disease with angina pectoris on arrival. Status post PCI to the RCA No longer experiencing angina pectoris Has done well postprocedure without any postoperative complications Home medications restarted Discontinue Nitropatch and ambulate after 30 minutes to make sure he is not having effort related angina. Restarted home dose Toprol-XL, Entresto, spironolactone. Increase Lipitor to 80 mg p.o. daily Recommend dual antiplatelet therapy for 1 year-07/20/2023. Reemphasized importance of lipid and triglyceride management. Recommend transition care office visit in 1 week given the NSTEMI Reemphasized the importance of cardiac rehab.  Chronic combined systolic and diastolic heart failure: Stage B, NYHA class II. Echo 05/2021 noted LVEF 45-50%, global hypokinesis. History of significant alcohol consumption in the past. Has done well with uptitration of GDMT. Recommend echocardiogram as outpatient.  Essential hypertension: Blood pressure not well-controlled.  Likely secondary to not starting home antihypertensive medications. Will restart  home medications.  Dyslipidemia: Hypertriglyceridemia Transition to high intensity statin. Likely will need Vascepa-will look into patient assistance program.  He is unable to afford at the current time  Non-insulin-dependent diabetes mellitus type 2: Reemphasized importance of glycemic control.  Plan of care discussed with the patient, primary team, follow-up appointment scheduled in discharge paperwork updated  Patient's questions and concerns were addressed to his satisfaction. He voices understanding of the instructions provided during this encounter.   This note was created using a voice recognition software as a result there may be grammatical errors inadvertently enclosed that do not reflect the nature of this encounter. Every attempt is made to correct such  errors.  [name], [place], [facility]  Pager:  [phone] Office: [phone]

## 2022-07-21 NOTE — Telephone Encounter (Signed)
Spoke to patient. He will continue Toprol-XL 50 mg p.o. daily. He will keep a monitor of his blood pressure and pulse and if needed will increase the dose of Toprol-XL at next office visit  [name], DO, [facility]

## 2022-07-22 ENCOUNTER — Other Ambulatory Visit: Payer: Self-pay

## 2022-07-22 DIAGNOSIS — E781 Pure hyperglyceridemia: Secondary | ICD-10-CM

## 2022-07-22 DIAGNOSIS — I2510 Atherosclerotic heart disease of native coronary artery without angina pectoris: Secondary | ICD-10-CM

## 2022-07-22 DIAGNOSIS — I10 Essential (primary) hypertension: Secondary | ICD-10-CM

## 2022-07-22 DIAGNOSIS — Z955 Presence of coronary angioplasty implant and graft: Secondary | ICD-10-CM

## 2022-07-22 DIAGNOSIS — E785 Hyperlipidemia, unspecified: Secondary | ICD-10-CM

## 2022-07-22 DIAGNOSIS — I5042 Chronic combined systolic (congestive) and diastolic (congestive) heart failure: Secondary | ICD-10-CM

## 2022-07-22 LAB — LIPOPROTEIN A (LPA): Lipoprotein (a): <8.4 nmol/L (ref <75.0)

## 2022-07-22 NOTE — Telephone Encounter (Signed)
Location of hospitalization: [place] Reason for hospitalization: chest pain Date of discharge: 07/21/22 Date of first communication with patient: today Person contacting patient: Me Current symptoms: some shortness of breath on exertion but feeling good Do you understand why you were in the Hospital: Yes Questions regarding discharge instructions: None Where were you discharged to: Home Medications reviewed: Yes Allergies reviewed: Yes Dietary changes reviewed: Yes. Discussed low fat and low salt diet.  Referals reviewed: NA Activities of Daily Living: Able to with mild limitations Any transportation issues/concerns: None Any patient concerns: None Confirmed importance & date/time of Follow up appt: Yes Confirmed with patient if condition begins to worsen call. Pt was given the office number and encouraged to call back with questions or concerns: Yes

## 2022-07-24 ENCOUNTER — Telehealth: Payer: Self-pay

## 2022-07-24 ENCOUNTER — Encounter: Payer: Self-pay | Admitting: Cardiology

## 2022-07-24 NOTE — Telephone Encounter (Signed)
I sent it to him via Epic.   Allissa Albright [place], DO, [facility]

## 2022-07-24 NOTE — Telephone Encounter (Signed)
Patient called asking for a work note. Patient will be going back on Monday 07/27/2022. Due that he was out due to a stent

## 2022-07-24 NOTE — Telephone Encounter (Signed)
Called patient to inform him that his work note is ready for him to print it off. Patient understood

## 2022-07-27 ENCOUNTER — Encounter: Payer: Self-pay | Admitting: Cardiology

## 2022-07-27 ENCOUNTER — Ambulatory Visit: Payer: BC Managed Care – PPO | Admitting: Cardiology

## 2022-07-27 VITALS — BP 130/87 | HR 98 | Resp 18 | Ht 74.0 in | Wt 225.4 lb

## 2022-07-27 DIAGNOSIS — I10 Essential (primary) hypertension: Secondary | ICD-10-CM

## 2022-07-27 DIAGNOSIS — E876 Hypokalemia: Secondary | ICD-10-CM

## 2022-07-27 DIAGNOSIS — E119 Type 2 diabetes mellitus without complications: Secondary | ICD-10-CM

## 2022-07-27 DIAGNOSIS — Z955 Presence of coronary angioplasty implant and graft: Secondary | ICD-10-CM

## 2022-07-27 DIAGNOSIS — Z87891 Personal history of nicotine dependence: Secondary | ICD-10-CM

## 2022-07-27 DIAGNOSIS — I2510 Atherosclerotic heart disease of native coronary artery without angina pectoris: Secondary | ICD-10-CM

## 2022-07-27 DIAGNOSIS — I5042 Chronic combined systolic (congestive) and diastolic (congestive) heart failure: Secondary | ICD-10-CM

## 2022-07-27 DIAGNOSIS — E785 Hyperlipidemia, unspecified: Secondary | ICD-10-CM

## 2022-07-27 DIAGNOSIS — R072 Precordial pain: Secondary | ICD-10-CM

## 2022-07-27 DIAGNOSIS — R931 Abnormal findings on diagnostic imaging of heart and coronary circulation: Secondary | ICD-10-CM

## 2022-07-27 DIAGNOSIS — E781 Pure hyperglyceridemia: Secondary | ICD-10-CM

## 2022-07-27 DIAGNOSIS — Z8249 Family history of ischemic heart disease and other diseases of the circulatory system: Secondary | ICD-10-CM

## 2022-07-27 MED ORDER — VASCEPA 1 G PO CAPS
2.0000 g | ORAL_CAPSULE | Freq: Two times a day (BID) | ORAL | 0 refills | Status: DC
Start: 2022-07-27 — End: 2022-08-26

## 2022-07-27 MED ORDER — SPIRONOLACTONE 25 MG PO TABS
25.0000 mg | ORAL_TABLET | ORAL | 0 refills | Status: DC
Start: 2022-07-27 — End: 2022-10-20

## 2022-07-27 MED ORDER — METOPROLOL SUCCINATE ER 100 MG PO TB24
100.0000 mg | ORAL_TABLET | Freq: Every day | ORAL | 0 refills | Status: DC
Start: 2022-07-27 — End: 2022-08-04

## 2022-07-27 NOTE — Progress Notes (Signed)
ID:  Brandon Hogan, DOB [birth year], MRN [id number]  PCP:  [name], NP  Cardiologist:  [name], DO, [facility] (established care 05/20/2021) Former Cardiology Providers: Donnelly Stager, NP Sleep medicine provider: Dr. Vickey Huger  Date: 07/27/22 Last Office Visit: 07/10/2022  Chief Complaint  Patient presents with   NSTEMI   Follow-up    1 week   HPI  Brandon Hogan is a 52 y.o. Caucasian male whose past medical history and cardiovascular risk factors include: Non-obstructive CAD per CCTA, mild CAC (32.2AU, 81st percentile), chronic combined systolic and diastolic heart failure, hypertension, hyperlipidemia, family history of premature CAD, type 2 diabetes, former smoker, history of excessive alcohol use.   In February 2023 he presented to the office for evaluation of chest pain after stopping all medications during the COVID-19 pandemic.  After up titration of medical therapy he underwent ischemic workup due to continued symptoms.  CT FFR/coronary CTA data noted concern for possible disease in the mid LCx which was in the intermediate zone based on CT FFR results and therefore after long discussion shared decision was to continue medical therapy and improve modifiable cardiovascular risk factors until unless if symptoms worsen.  During last office visit in April 2024 we discussed undergoing invasive angiography due to continued symptoms; however, patient was to continue to uptitrate medical therapy.  However, in the interim he presented to [facility], ED with chest pain and ruled in for NSTEMI.  Patient underwent invasive angiography and was noted to have obstructive disease in the RCA distribution.  Underwent coronary intervention.  He now presents for follow-up.  Since discharge patient is doing well from a cardiovascular standpoint.  He no longer experiences chest pain.  His shortness of breath with effort related activities has improved significantly.  Home blood pressures 130  mmHg but pulse is greater than 90 bpm.  He has not yet started cardiac rehab.  Family history of premature CAD with both father and brother having MI in their 72s and they both went bypass surgery.  Mom died at the age of 16 due to myocardial infarction.  About 10 years ago he used to drink 12-14 bottles of beer on a daily basis for 4-5 years.  Currently works as an Scientist, product/process development at [employer].  FUNCTIONAL STATUS: No structured exercise program or daily routine.   ALLERGIES: No Known Allergies  MEDICATION LIST PRIOR TO VISIT: Current Meds  Medication Sig   allopurinol (ZYLOPRIM) 300 MG tablet Take 300 mg by mouth daily.   aspirin EC 81 MG tablet Take 81 mg by mouth daily. Swallow whole.   atorvastatin (LIPITOR) 80 MG tablet Take 1 tablet (80 mg total) by mouth daily.   FARXIGA 10 MG TABS tablet TAKE 1 TABLET(10 MG) BY MOUTH DAILY BEFORE BREAKFAST (Patient taking differently: Take 10 mg by mouth daily.)   fenofibrate (TRICOR) 145 MG tablet Take 1 tablet (145 mg total) by mouth daily.   metFORMIN (GLUCOPHAGE-XR) 500 MG 24 hr tablet Take 2 tablets (1,000 mg total) by mouth 2 (two) times daily with a meal.   Multiple Vitamin (MULTIVITAMIN WITH MINERALS) TABS tablet Take 1 tablet by mouth daily.   sacubitril-valsartan (ENTRESTO) 97-103 MG Take 1 tablet by mouth 2 (two) times daily.   ticagrelor (BRILINTA) 90 MG TABS tablet Take 1 tablet (90 mg total) by mouth 2 (two) times daily.   VASCEPA 1 g capsule Take 2 capsules (2 g total) by mouth 2 (two) times daily.   venlafaxine XR (EFFEXOR-XR) 75  MG 24 hr capsule Take 75 mg by mouth daily with breakfast.   [DISCONTINUED] metoprolol succinate (TOPROL-XL) 50 MG 24 hr tablet TAKE 1 TABLET(50 MG) BY MOUTH DAILY WITH OR IMMEDIATELY FOLLOWING A MEAL (Patient taking differently: Take 100 mg by mouth daily.)   [DISCONTINUED] Omega-3 Fatty Acids (FISH OIL PO) Take 2 capsules by mouth daily.   [DISCONTINUED] spironolactone (ALDACTONE) 25 MG tablet  TAKE 1 TABLET(25 MG) BY MOUTH EVERY MORNING (Patient taking differently: Take 25 mg by mouth daily.)     PAST MEDICAL HISTORY: Past Medical History:  Diagnosis Date   Cardiomyopathy    Choledocholithiasis 09/16/2016   Diabetes mellitus without complication    Elevated LFTs 09/16/2016   Gallstone    Hyperlipidemia    Hypertension    Pancreatitis: Post ERCP mild pancreatitis 09/20/2016    PAST SURGICAL HISTORY: Past Surgical History:  Procedure Laterality Date   CHOLECYSTECTOMY N/A 09/18/2016   Procedure: LAPAROSCOPIC CHOLECYSTECTOMY;  Surgeon: Almond Lint, MD;  Location: [facility] OR;  Service: General;  Laterality: N/A;   CHOLECYSTECTOMY  2018   CORONARY IMAGING/OCT Right 07/20/2022   Procedure: CORONARY IMAGING/OCT;  Surgeon: Elder Negus, MD;  Location: [facility] INVASIVE CV LAB;  Service: Cardiovascular;  Laterality: Right;  prox. RCA   CORONARY STENT INTERVENTION Right 07/20/2022   Procedure: CORONARY STENT INTERVENTION;  Surgeon: Elder Negus, MD;  Location: [facility] INVASIVE CV LAB;  Service: Cardiovascular;  Laterality: Right;  prox. RCA   ERCP N/A 09/18/2016   Procedure: ENDOSCOPIC RETROGRADE CHOLANGIOPANCREATOGRAPHY (ERCP);  Surgeon: Iva Boop, MD;  Location: [facility] OR;  Service: Endoscopy;  Laterality: N/A;   LEFT HEART CATH AND CORONARY ANGIOGRAPHY N/A 07/20/2022   Procedure: LEFT HEART CATH AND CORONARY ANGIOGRAPHY;  Surgeon: Elder Negus, MD;  Location: [facility] INVASIVE CV LAB;  Service: Cardiovascular;  Laterality: N/A;    FAMILY HISTORY: The patient family history includes Gallbladder disease in his father; Heart attack in his mother; Heart attack (age of onset: 61) in his brother and father.  SOCIAL HISTORY:  The patient  reports that he quit smoking about 11 years ago. His smoking use included cigarettes. He has a 50.00 pack-year smoking history. He has never used smokeless tobacco. He reports current alcohol use. He reports that he does not use drugs.  REVIEW  OF SYSTEMS: Review of Systems  Cardiovascular:  Positive for dyspnea on exertion (better). Negative for chest pain, claudication, irregular heartbeat, leg swelling, near-syncope, orthopnea, palpitations, paroxysmal nocturnal dyspnea and syncope.  Respiratory:  Negative for shortness of breath.   Hematologic/Lymphatic: Negative for bleeding problem.  Musculoskeletal:  Negative for muscle cramps and myalgias.  Neurological:  Negative for dizziness and light-headedness.    PHYSICAL EXAM:    07/27/2022    3:20 PM 07/21/2022    8:20 AM 07/21/2022    4:58 AM  Vitals with BMI  Height 6\' 2"     Weight 225 lbs 6 oz    BMI 28.93    Systolic 130 137 161  Diastolic 87 92 75  Pulse 98 89 83   Physical Exam  Constitutional: No distress.  Age appropriate, hemodynamically stable.   Neck: No JVD present.  Cardiovascular: Normal rate, regular rhythm, S1 normal, S2 normal, intact distal pulses and normal pulses. Exam reveals no gallop, no S3 and no S4.  No murmur heard. Pulses:      Dorsalis pedis pulses are 2+ on the right side and 2+ on the left side.       Posterior tibial pulses are  2+ on the right side and 2+ on the left side.  Pulmonary/Chest: Effort normal and breath sounds normal. No stridor. He has no wheezes. He has no rales.  Abdominal: Soft. Bowel sounds are normal. He exhibits no distension. There is no abdominal tenderness.  Musculoskeletal:        General: No edema.     Cervical back: Neck supple.  Neurological: He is alert and oriented to person, place, and time. He has intact cranial nerves (2-12).  Skin: Skin is warm and moist.   CARDIAC DATABASE: EKG: 07/10/2022: Normal sinus rhythm, 87 bpm, normal axis, without underlying ischemia or injury pattern.  Echocardiogram: 05/22/2021:  Left ventricle cavity is normal in size. Normal left ventricular wall thickness. Hypokinetic global wall motion. Normal diastolic filling pattern. Mildly depressed LV systolic function with visual EF  45-50%. Left atrial cavity is mildly dilated. Compared to 08/19/2018, LVEF previously reported at 50 to 55%, however on 08/11/2016, EF was reported to be 40 to 45%. Overall no significant change.  Stress Testing: No results found for this or any previous visit from the past 1095 days.  CCTA 06/18/2021: 1. Total coronary calcium score of 32.3. This was 81st percentile for age and sex matched control.   2. Normal coronary origin with right dominance.   3. CAD-RADS = 2 Mild non-obstructive CAD. Left main: Patent. LAD: Minimal (0-24%) mixed plaque within the proximal / mid segment. LCx: Mild stenosis (25-49%) due to noncalcified plaque at mid LCX but accuracy is limited due to artifact. RCA: Mild stenosis (25-49%) due to mixed plaque proximal to mid RCA. 4. Study is sent for CT-FFR to further evaluate the LCX. Findings will be performed and reported separately. 5. No significant incidental findings.  CT-FFR Impression:  1. CT FFR analysis showed no significant stenosis in Left main, LAD, and RCA.  2. Possible hemodynamically significant stenosis within the mid-distal LCx, CT-FFR valves are within the indeterminate zone, clinical correlation required.   Recommendations: Goal directed and uptitration of anti-anginal therapy.  Aggressive risk factor modification for secondary prevention of coronary artery disease.  If symptoms persist despite uptitration of medical therapy consider invasive angiography to further evaluate the LCX.   Heart Catheterization: 07/20/2022 LM: Normal LAD: No significant disease          Diag 2 small to medium caliber. Focal mid 70% stenosis Lcx: Mid long 70% stenosis, RFR 0.94 (physiologically non-significant) RCA: Prox ulcerated 80% stenosis          Mid focal 50% stenosis   Successful OCT guided percutaneous coronary intervention prox RCA     Direct stent placement 4.0 X 16 mm Synergy drug-eluting stent     Post dilatation with 4.5X12 mm Buhl balloon ay 16  atm   OCT MLA 3 mm2-->MSA 11 mm2   LABORATORY DATA:    Latest Ref Rng & Units 07/21/2022    2:13 AM 07/20/2022    6:44 PM 07/19/2022    7:57 PM  CBC  WBC 4.0 - 10.5 K/uL 7.7  9.2  12.1   Hemoglobin 13.0 - 17.0 g/dL 14.7  82.9  56.2   Hematocrit 39.0 - 52.0 % 38.0  42.9  43.3   Platelets 150 - 400 K/uL 204  232  259        Latest Ref Rng & Units 07/21/2022    2:13 AM 07/20/2022    7:51 AM 07/19/2022    7:57 PM  CMP  Glucose 70 - 99 mg/dL 130  865  784  BUN 6 - 20 mg/dL 13  26  11    Creatinine 0.61 - 1.24 mg/dL 1.61  0.96  0.45   Sodium 135 - 145 mmol/L 140  138  136   Potassium 3.5 - 5.1 mmol/L 3.0  3.0  2.9   Chloride 98 - 111 mmol/L 102  104  102   CO2 22 - 32 mmol/L 25  24  22    Calcium 8.9 - 10.3 mg/dL 9.0  8.7  9.4     Lipid Panel  Lab Results  Component Value Date   CHOL 169 07/20/2022   HDL 30 (L) 07/20/2022   LDLCALC 65 07/20/2022   LDLDIRECT 88 07/01/2021   TRIG 371 (H) 07/20/2022   CHOLHDL 5.6 07/20/2022    No components found for: "NTPROBNP" No results for input(s): "PROBNP" in the last 8760 hours.  No results for input(s): "TSH" in the last 8760 hours.   BMP Recent Labs    07/19/22 1957 07/20/22 0751 07/21/22 0213  NA 136 138 140  K 2.9* 3.0* 3.0*  CL 102 104 102  CO2 22 24 25   GLUCOSE 205* 172* 138*  BUN 11 26* 13  CREATININE 1.17 1.21 1.15  CALCIUM 9.4 8.7* 9.0  GFRNONAA >60 >60 >60    HEMOGLOBIN A1C Lab Results  Component Value Date   HGBA1C 5.2 09/19/2016   MPG 103 09/19/2016   External Labs 06/23/2021: Vitamin B12 562   [phone]  Hemoglobin A1C   2021-06-16    Estimated Average Glucose 123      Hemoglobin A1C 5.9   <5.7  Lipid Panel   2021-06-16    Cholesterol 177   <200  Cholesterol / HDL Ratio 5.36   0.00-4.99  HDL Cholesterol 33   >39  LDL Cholesterol (Calculation) SEE COMMENT   <130  LDL/HDL Ratio SEE COMMENT   <3.3  Non-HDL Cholesterol 144   <130  Triglycerides 448   <150   External Labs: Collected:  03/18/2021 A1c 6.8  Total cholesterol 182, triglycerides 233, HDL 32, non-HDL 150, LDL 103. Hemoglobin 14.7, hematocrit 43.3% Sodium 140, potassium 3.7, chloride 103, bicarb 27, BUN 9, creatinine 1.4. AST 32, ALT 59, alkaline phosphatase 113. eGFR 59  Collected 06/22/2022 at PCP   Hemoglobin A1C 7.7 <5.7 % The following HbA1c ranges recommended by the American Diabetes Association  (ADA) may be used as an aid in the diagnosis of diabetes mellitus.  HA1c Suggested Diagnosis  >=6.5% Diabetic  5.7% - 6.4% Pre-Diabetic  <5.7% Non-Diabetic   Estimated Average Glucose 174   Average Glucose is calculated using the equation AG = (28.7 x HgbA1c) - 46.7  based on the guidelines established by the ADA.   Lipid Panel  Cholesterol 147 <200 mg/dL    Triglycerides 409 <811 mg/dL    HDL Cholesterol 32 >91 mg/dL    Cholesterol / HDL Ratio 4.59 0.00-4.99 Ratio    Non-HDL Cholesterol 115 <130 mg/dL    LDL Cholesterol (Calculation) 46 <130 mg/dL LDL Cholesterol Levels*  Less than 100 mg/dL Optimal  478 to 295 mg/dL Near Optimal/ Above Optimal  130 to 159 mg/dL Borderline High  621 to 189 mg/dL High  308 mg/dL and above Very High  * Categories as recommended by the 2004 ATPIII guidelines   LDL/HDL Ratio 1.4 <3.3 Ratio _________________________________________________________________________  LDL Cholesterol Patient History  _________________________________________________________________________  Test Date: 12/16/2021  LDL Results: 33  Units: mg/dL  % Change: -  -------------------------------------------------------------------------  Test Date: 03/19/2022  LDL Results: 51  Units:  mg/dL  % Change: +62%  -------------------------------------------------------------------------  Test Date: 06/18/2022  LDL Results: 46  Units: mg/dL  % Change: -9%  _________________________________________________________________________   Sodium 143 136-145 mEq/L    Potassium 3.5 3.5-5.1 mEq/L    Chloride  102 98-107 mEq/L    CO2 27 21-32 mmol/L    Glucose 195 74-106 mg/dL    BUN 10 9-52 mg/dL    Creatinine 8.41 3.24-4.01 mg/dL    Calcium 9.9 0.2-72.5 mg/dL    Protein 7.7 3.6-6.4 g/dL    Albumin 4.4 4.0-3.4 g/dL    Alkaline Phosphatase 158 25-150 IU/L    ALT (SGPT) 108 <6-78 IU/L    AST (SGOT) 48 0-40 IU/L    Bilirubin, Total 0.9 0.2-1.0 mg/dL    Globulin, Calculated 3.3 1.5-4.6 g/dL    Albumin/Globulin Ratio 1.3 1.1-2.5 mg/dL    BUN/Creatinine Ratio 9.3 11.0-26.0 Ratio    GFR/Black 93 >59 mL/min/1.63m2    GFR/White 80 >59 mL/min/1.22m2 GFR Categories in Chronic Kidney Disease (CKD)  GFR Category GFR (mL/min/1.73 sq. meters) Interpretation  G1 90 or greater Normal or high*  G2 60-89 Mild decrease*  G3a 45-59 Mild to moderate decrease  G3b 30-44 Moderate to severe decrease  G4 15-29 Severe decrease  G5 14 or less Kidney failure  *In the absence of kidney damage, neither GFR category G1 or G2 fulfill  the criteria for CKD (Kidney Int Suppl 2013; 3.1-150)  The CKD-EPI calculation is intended for use in patients 44 years of age and  older. Decreased calculation accuracy may be seen in patients taking  medications that affect renal excretion, or in those patients with extremes in  muscle mass or diet.     IMPRESSION:    ICD-10-CM   1. Atherosclerosis of native coronary artery of native heart without angina pectoris  I25.10 EKG 12-Lead    metoprolol succinate (TOPROL-XL) 100 MG 24 hr tablet    VASCEPA 1 g capsule    2. Status post coronary artery stent placement  Z95.5     3. Agatston coronary artery calcium score less than 100  R93.1 EKG 12-Lead    metoprolol succinate (TOPROL-XL) 100 MG 24 hr tablet    VASCEPA 1 g capsule    4. Chronic combined systolic and diastolic heart failure  I50.42     5. Essential hypertension  I10 spironolactone (ALDACTONE) 25 MG tablet    6. Dyslipidemia  E78.5     7. Hypertriglyceridemia  E78.1     8. Non-insulin dependent type 2 diabetes  mellitus  E11.9     9. Former smoker  Z87.891     10. Family history of premature CAD  Z82.49     11. Precordial pain  R07.2 spironolactone (ALDACTONE) 25 MG tablet    12. Hypokalemia  E87.6 Basic metabolic panel       RECOMMENDATIONS: Brandon Hogan is a 52 y.o. Caucasian male whose past medical history and cardiac risk factors include: Non-obstructive CAD per CCTA, mild CAC (32.2AU, 81st percentile), chronic combined systolic and diastolic heart failure, hypertension, hyperlipidemia, family history of premature CAD, type 2 diabetes, former smoker, history of excessive alcohol use.   Atherosclerosis of native coronary artery of native heart without angina pectoris Status post RCA stent placement  Agatston coronary artery calcium score less than 100 Denies angina pectoris EKG nonischemic Coronary CTA: Total CAC 32.3, 81st percentile, nonobstructive CAD. Medications reconciled. Increase Toprol-XL to 100 mg p.o. daily. Continue Lipitor 80 mg p.o. nightly. Dual antiplatelet therapy for 1 year-July 20, 2023 Reemphasized importance of lipid and triglyceride management.  Prescription for Vascepa sent to specialty pharmacy. Patient has some shortness of breath at the time of taking Brilinta.  Have asked him to consume caffeinated beverage after taking the medication to minimize side effects.  If the symptoms are still concerning will likely need to transition him to Plavix. Recommended initiating cardiac rehab  Chronic combined systolic and diastolic heart failure Stage B, NYHA class II. Medications reconciled. Echo February 2023: LVEF 45-50% Reemphasized importance of complete cessation of alcohol consumption. Monitor for now  Hypokalemia Noted during recent hospitalization. Recheck BMP  Essential hypertension Office blood pressures are well-controlled. Medications reconciled. No changes warranted at this time  Dyslipidemia Hypertriglyceridemia Currently on  atorvastatin.   He denies myalgia or other side effects. Most recent lipids dated April 2024, independently reviewed as noted above. LDL levels are acceptable but not at goal. Triglyceride levels need to be improved significantly.  In the past, we will see what the cost prohibitive.  Sending the prescription to specialty pharmacy.  Once Vascepa started he is asked to discontinue fish oil  Non-insulin dependent type 2 diabetes mellitus Currently managed by primary team.  Sleep apnea: Reemphasized the importance of device compliance. Currently managed by Dr. Vickey Huger.   FINAL MEDICATION LIST END OF ENCOUNTER: Meds ordered this encounter  Medications   metoprolol succinate (TOPROL-XL) 100 MG 24 hr tablet    Sig: Take 1 tablet (100 mg total) by mouth daily. Take with or immediately following a meal.    Dispense:  90 tablet    Refill:  0   spironolactone (ALDACTONE) 25 MG tablet    Sig: Take 1 tablet (25 mg total) by mouth every morning. TAKE 1 TABLET(25 MG) BY MOUTH EVERY MORNING Strength: 25 mg    Dispense:  90 tablet    Refill:  0   VASCEPA 1 g capsule    Sig: Take 2 capsules (2 g total) by mouth 2 (two) times daily.    Dispense:  360 capsule    Refill:  0    Medications Discontinued During This Encounter  Medication Reason   metoprolol succinate (TOPROL-XL) 50 MG 24 hr tablet Reorder   spironolactone (ALDACTONE) 25 MG tablet Reorder   Omega-3 Fatty Acids (FISH OIL PO)      Current Outpatient Medications:    allopurinol (ZYLOPRIM) 300 MG tablet, Take 300 mg by mouth daily., Disp: , Rfl:    aspirin EC 81 MG tablet, Take 81 mg by mouth daily. Swallow whole., Disp: , Rfl:    atorvastatin (LIPITOR) 80 MG tablet, Take 1 tablet (80 mg total) by mouth daily., Disp: 30 tablet, Rfl: 11   FARXIGA 10 MG TABS tablet, TAKE 1 TABLET(10 MG) BY MOUTH DAILY BEFORE BREAKFAST (Patient taking differently: Take 10 mg by mouth daily.), Disp: 90 tablet, Rfl: 0   fenofibrate (TRICOR) 145 MG tablet,  Take 1 tablet (145 mg total) by mouth daily., Disp: 90 tablet, Rfl: 0   metFORMIN (GLUCOPHAGE-XR) 500 MG 24 hr tablet, Take 2 tablets (1,000 mg total) by mouth 2 (two) times daily with a meal., Disp: , Rfl: 4   Multiple Vitamin (MULTIVITAMIN WITH MINERALS) TABS tablet, Take 1 tablet by mouth daily., Disp: , Rfl:    sacubitril-valsartan (ENTRESTO) 97-103 MG, Take 1 tablet by mouth 2 (two) times daily., Disp: 180 tablet, Rfl: 0   ticagrelor (BRILINTA) 90 MG TABS tablet, Take 1 tablet (90 mg total) by mouth 2 (two) times daily., Disp: 60 tablet, Rfl: 11  VASCEPA 1 g capsule, Take 2 capsules (2 g total) by mouth 2 (two) times daily., Disp: 360 capsule, Rfl: 0   venlafaxine XR (EFFEXOR-XR) 75 MG 24 hr capsule, Take 75 mg by mouth daily with breakfast., Disp: , Rfl:    metoprolol succinate (TOPROL-XL) 100 MG 24 hr tablet, Take 1 tablet (100 mg total) by mouth daily. Take with or immediately following a meal., Disp: 90 tablet, Rfl: 0   spironolactone (ALDACTONE) 25 MG tablet, Take 1 tablet (25 mg total) by mouth every morning. TAKE 1 TABLET(25 MG) BY MOUTH EVERY MORNING Strength: 25 mg, Disp: 90 tablet, Rfl: 0  Orders Placed This Encounter  Procedures   Basic metabolic panel   EKG 12-Lead    There are no Patient Instructions on file for this visit.   --Continue cardiac medications as reconciled in final medication list. --Return in about 4 weeks (around 08/24/2022) for Follow up, CAD. Or sooner if needed. --Continue follow-up with your primary care physician regarding the management of your other chronic comorbid conditions.  Patient's questions and concerns were addressed to his satisfaction. He voices understanding of the instructions provided during this encounter.   This note was created using a voice recognition software as a result there may be grammatical errors inadvertently enclosed that do not reflect the nature of this encounter. Every attempt is made to correct such errors.  [name], [place], [facility]  Pager:  [phone] Office: [phone]

## 2022-07-28 LAB — BASIC METABOLIC PANEL
BUN/Creatinine Ratio: 22 — ABNORMAL HIGH (ref 9–20)
BUN: 27 mg/dL — ABNORMAL HIGH (ref 6–24)
CO2: 18 mmol/L — ABNORMAL LOW (ref 20–29)
Calcium: 10.8 mg/dL — ABNORMAL HIGH (ref 8.7–10.2)
Chloride: 100 mmol/L (ref 96–106)
Creatinine, Ser: 1.24 mg/dL (ref 0.76–1.27)
Glucose: 126 mg/dL — ABNORMAL HIGH (ref 70–99)
Potassium: 4.1 mmol/L (ref 3.5–5.2)
Sodium: 138 mmol/L (ref 134–144)
eGFR: 71 mL/min/{1.73_m2} (ref >59)

## 2022-07-29 NOTE — Progress Notes (Signed)
Gave patient results, he acknowledged understanding.

## 2022-08-04 ENCOUNTER — Telehealth (HOSPITAL_COMMUNITY): Payer: Self-pay

## 2022-08-04 ENCOUNTER — Other Ambulatory Visit: Payer: Self-pay | Admitting: Cardiology

## 2022-08-04 DIAGNOSIS — I2510 Atherosclerotic heart disease of native coronary artery without angina pectoris: Secondary | ICD-10-CM

## 2022-08-04 DIAGNOSIS — R931 Abnormal findings on diagnostic imaging of heart and coronary circulation: Secondary | ICD-10-CM

## 2022-08-04 MED ORDER — METOPROLOL SUCCINATE ER 100 MG PO TB24
100.0000 mg | ORAL_TABLET | Freq: Every day | ORAL | 2 refills | Status: DC
Start: 2022-08-04 — End: 2022-08-13

## 2022-08-04 NOTE — Telephone Encounter (Signed)
Called and spoke with pt in regards to CR, pt stated he is unable to participate at this time due to his work schedule.   Closed referral 

## 2022-08-10 ENCOUNTER — Ambulatory Visit: Payer: BC Managed Care – PPO

## 2022-08-10 DIAGNOSIS — I5042 Chronic combined systolic (congestive) and diastolic (congestive) heart failure: Secondary | ICD-10-CM

## 2022-08-10 DIAGNOSIS — I25118 Atherosclerotic heart disease of native coronary artery with other forms of angina pectoris: Secondary | ICD-10-CM

## 2022-08-10 DIAGNOSIS — R931 Abnormal findings on diagnostic imaging of heart and coronary circulation: Secondary | ICD-10-CM

## 2022-08-13 ENCOUNTER — Other Ambulatory Visit: Payer: Self-pay

## 2022-08-13 DIAGNOSIS — I5042 Chronic combined systolic (congestive) and diastolic (congestive) heart failure: Secondary | ICD-10-CM

## 2022-08-13 DIAGNOSIS — R931 Abnormal findings on diagnostic imaging of heart and coronary circulation: Secondary | ICD-10-CM

## 2022-08-13 DIAGNOSIS — I25118 Atherosclerotic heart disease of native coronary artery with other forms of angina pectoris: Secondary | ICD-10-CM

## 2022-08-13 DIAGNOSIS — I2510 Atherosclerotic heart disease of native coronary artery without angina pectoris: Secondary | ICD-10-CM

## 2022-08-13 MED ORDER — TICAGRELOR 90 MG PO TABS: 90.0000 mg | ORAL_TABLET | Freq: Two times a day (BID) | ORAL | 3 refills | Status: DC

## 2022-08-13 MED ORDER — METOPROLOL SUCCINATE ER 100 MG PO TB24
100.0000 mg | ORAL_TABLET | Freq: Every day | ORAL | 3 refills | Status: DC
Start: 2022-08-13 — End: 2022-08-18

## 2022-08-13 MED ORDER — ENTRESTO 97-103 MG PO TABS
1.0000 | ORAL_TABLET | Freq: Two times a day (BID) | ORAL | 3 refills | Status: DC
Start: 2022-08-13 — End: 2023-08-12

## 2022-08-17 ENCOUNTER — Ambulatory Visit: Payer: BC Managed Care – PPO | Admitting: Cardiology

## 2022-08-18 ENCOUNTER — Encounter: Payer: Self-pay | Admitting: Cardiology

## 2022-08-18 ENCOUNTER — Ambulatory Visit: Payer: BC Managed Care – PPO | Admitting: Cardiology

## 2022-08-18 VITALS — BP 119/79 | HR 90 | Resp 16 | Ht 74.0 in | Wt 225.4 lb

## 2022-08-18 DIAGNOSIS — E782 Mixed hyperlipidemia: Secondary | ICD-10-CM

## 2022-08-18 DIAGNOSIS — I10 Essential (primary) hypertension: Secondary | ICD-10-CM

## 2022-08-18 DIAGNOSIS — Z955 Presence of coronary angioplasty implant and graft: Secondary | ICD-10-CM

## 2022-08-18 DIAGNOSIS — E119 Type 2 diabetes mellitus without complications: Secondary | ICD-10-CM

## 2022-08-18 DIAGNOSIS — R931 Abnormal findings on diagnostic imaging of heart and coronary circulation: Secondary | ICD-10-CM

## 2022-08-18 DIAGNOSIS — Z87891 Personal history of nicotine dependence: Secondary | ICD-10-CM

## 2022-08-18 DIAGNOSIS — I25118 Atherosclerotic heart disease of native coronary artery with other forms of angina pectoris: Secondary | ICD-10-CM

## 2022-08-18 DIAGNOSIS — I5032 Chronic diastolic (congestive) heart failure: Secondary | ICD-10-CM

## 2022-08-18 DIAGNOSIS — E781 Pure hyperglyceridemia: Secondary | ICD-10-CM

## 2022-08-18 MED ORDER — METOPROLOL SUCCINATE ER 100 MG PO TB24
100.0000 mg | ORAL_TABLET | Freq: Two times a day (BID) | ORAL | 3 refills | Status: DC
Start: 2022-08-18 — End: 2022-11-18

## 2022-08-18 NOTE — Progress Notes (Signed)
ID:  LYNN WAGAMAN, DOB 09/01/1970, MRN [id number]  PCP:  [name], NP  Cardiologist:  [name], DO, [facility] (established care 05/20/2021) Former Cardiology Providers: Donnelly Stager, NP Sleep medicine provider: Dr. Vickey Huger  Date: 08/18/22 Last Office Visit: 07/27/2022  Chief Complaint  Patient presents with   Coronary Artery Disease   Follow-up   HPI  Brandon Hogan is a 52 y.o. Caucasian male whose past medical history and cardiovascular risk factors include: CAD status post PCI to the RCA, mild CAC (32.2AU, 81st percentile), heart failure with improved EF,  hypertension, hyperlipidemia, family history of premature CAD, type 2 diabetes, former smoker, history of excessive alcohol use.   Patient is being followed by the practice for CAD status post PCI to the RCA and disease in the LCx is being managed medically.  He recently presented to [facility] ED in April 2024 and ruled in for NSTEMI.  Underwent angiography and had intervention to the RCA.  He presents today for follow-up for underlying CAD.  He still has some residual chest pain but overall intensity frequency and duration has improved significantly when compared to the past.  He has started on Vascepa since last office visit but is concerned that it may be cost prohibitive.  His shortness of breath with Brilinta has also gotten better.  He is not consuming beer but does occasionally have liquor (1-2 shots since last office visit).  Patient states that he likely will not be able to do cardiac rehab due to work limitations.  Family history of premature CAD with both father and brother having MI in their 81s and they both went bypass surgery.  Mom died at the age of 23 due to myocardial infarction.  About 10 years ago he used to drink 12-14 bottles of beer on a daily basis for 4-5 years.  Currently works as an Scientist, product/process development at [employer].   FUNCTIONAL STATUS: No structured exercise program or daily  routine.   ALLERGIES: No Known Allergies  MEDICATION LIST PRIOR TO VISIT: Current Meds  Medication Sig   allopurinol (ZYLOPRIM) 300 MG tablet Take 300 mg by mouth daily.   aspirin EC 81 MG tablet Take 81 mg by mouth daily. Swallow whole.   atorvastatin (LIPITOR) 80 MG tablet Take 1 tablet (80 mg total) by mouth daily.   FARXIGA 10 MG TABS tablet TAKE 1 TABLET(10 MG) BY MOUTH DAILY BEFORE BREAKFAST (Patient taking differently: Take 10 mg by mouth daily.)   fenofibrate (TRICOR) 145 MG tablet Take 1 tablet (145 mg total) by mouth daily.   metFORMIN (GLUCOPHAGE-XR) 500 MG 24 hr tablet Take 2 tablets (1,000 mg total) by mouth 2 (two) times daily with a meal.   Multiple Vitamin (MULTIVITAMIN WITH MINERALS) TABS tablet Take 1 tablet by mouth daily.   sacubitril-valsartan (ENTRESTO) 97-103 MG Take 1 tablet by mouth 2 (two) times daily.   spironolactone (ALDACTONE) 25 MG tablet Take 1 tablet (25 mg total) by mouth every morning. TAKE 1 TABLET(25 MG) BY MOUTH EVERY MORNING Strength: 25 mg   ticagrelor (BRILINTA) 90 MG TABS tablet Take 1 tablet (90 mg total) by mouth 2 (two) times daily.   VASCEPA 1 g capsule Take 2 capsules (2 g total) by mouth 2 (two) times daily.   venlafaxine XR (EFFEXOR-XR) 75 MG 24 hr capsule Take 75 mg by mouth daily with breakfast.   [DISCONTINUED] metoprolol succinate (TOPROL-XL) 100 MG 24 hr tablet Take 1 tablet (100 mg total) by mouth daily. Take  with or immediately following a meal.     PAST MEDICAL HISTORY: Past Medical History:  Diagnosis Date   Cardiomyopathy (HCC)    Choledocholithiasis 09/16/2016   Diabetes mellitus without complication (HCC)    Elevated LFTs 09/16/2016   Gallstone    Hyperlipidemia    Hypertension    Pancreatitis: Post ERCP mild pancreatitis 09/20/2016    PAST SURGICAL HISTORY: Past Surgical History:  Procedure Laterality Date   CHOLECYSTECTOMY N/A 09/18/2016   Procedure: LAPAROSCOPIC CHOLECYSTECTOMY;  Surgeon: Almond Lint, MD;   Location: [facility] OR;  Service: General;  Laterality: N/A;   CHOLECYSTECTOMY  2018   CORONARY IMAGING/OCT Right 07/20/2022   Procedure: CORONARY IMAGING/OCT;  Surgeon: Elder Negus, MD;  Location: [facility] INVASIVE CV LAB;  Service: Cardiovascular;  Laterality: Right;  prox. RCA   CORONARY STENT INTERVENTION Right 07/20/2022   Procedure: CORONARY STENT INTERVENTION;  Surgeon: Elder Negus, MD;  Location: [facility] INVASIVE CV LAB;  Service: Cardiovascular;  Laterality: Right;  prox. RCA   ERCP N/A 09/18/2016   Procedure: ENDOSCOPIC RETROGRADE CHOLANGIOPANCREATOGRAPHY (ERCP);  Surgeon: Iva Boop, MD;  Location[facility] OR;  Service: Endoscopy;  Laterality: N/A;   LEFT HEART CATH AND CORONARY ANGIOGRAPHY N/A 07/20/2022   Procedure: LEFT HEART CATH AND CORONARY ANGIOGRAPHY;  Surgeon: Elder Negus, MD;  Location: [facility] INVASIVE CV LAB;  Service: Cardiovascular;  Laterality: N/A;    FAMILY HISTORY: The patient family history includes Gallbladder disease in his father; Heart attack in his mother; Heart attack (age of onset: 17) in his brother and father.  SOCIAL HISTORY:  The patient  reports that he quit smoking about 11 years ago. His smoking use included cigarettes. He has a 50.00 pack-year smoking history. He has never used smokeless tobacco. He reports current alcohol use. He reports that he does not use drugs.  REVIEW OF SYSTEMS: Review of Systems  Cardiovascular:  Positive for chest pain (See HPI) and dyspnea on exertion (better). Negative for claudication, irregular heartbeat, leg swelling, near-syncope, orthopnea, palpitations, paroxysmal nocturnal dyspnea and syncope.  Respiratory:  Negative for shortness of breath.   Hematologic/Lymphatic: Negative for bleeding problem.  Musculoskeletal:  Negative for muscle cramps and myalgias.  Neurological:  Negative for dizziness and light-headedness.    PHYSICAL EXAM:    08/18/2022    2:18 PM 07/27/2022    3:20 PM 07/21/2022    8:20 AM   Vitals with BMI  Height 6\' 2"  6\' 2"    Weight 225 lbs 6 oz 225 lbs 6 oz   BMI 28.93 28.93   Systolic 119 130 161  Diastolic 79 87 92  Pulse 90 98 89   Physical Exam  Constitutional: No distress.  Age appropriate, hemodynamically stable.   Neck: No JVD present.  Cardiovascular: Normal rate, regular rhythm, S1 normal, S2 normal, intact distal pulses and normal pulses. Exam reveals no gallop, no S3 and no S4.  No murmur heard. Pulses:      Dorsalis pedis pulses are 2+ on the right side and 2+ on the left side.       Posterior tibial pulses are 2+ on the right side and 2+ on the left side.  Pulmonary/Chest: Effort normal and breath sounds normal. No stridor. He has no wheezes. He has no rales.  Abdominal: Soft. Bowel sounds are normal. He exhibits no distension. There is no abdominal tenderness.  Musculoskeletal:        General: No edema.     Cervical back: Neck supple.  Neurological: He is alert  and oriented to person, place, and time. He has intact cranial nerves (2-12).  Skin: Skin is warm and moist.   CARDIAC DATABASE: EKG: 07/10/2022: Normal sinus rhythm, 87 bpm, normal axis, without underlying ischemia or injury pattern.  Echocardiogram: 05/22/2021:  Left ventricle cavity is normal in size. Normal left ventricular wall thickness. Hypokinetic global wall motion. Normal diastolic filling pattern. Mildly depressed LV systolic function with visual EF 45-50%. Left atrial cavity is mildly dilated. Compared to 08/19/2018, LVEF previously reported at 50 to 55%, however on 08/11/2016, EF was reported to be 40 to 45%. Overall no significant change.  08/10/2022: Normal LV systolic function with visual EF 55-60%. Left ventricle cavity is normal in size. Normal left ventricular wall thickness. Normal global wall motion. Normal diastolic filling pattern, normal LAP. Calculated EF 56%. Structurally normal tricuspid valve with trace regurgitation. No evidence of pulmonary hypertension. Compared to  05/2021, EF has improved from 45-50% to 55-60%.   Stress Testing: No results found for this or any previous visit from the past 1095 days.  CCTA 06/18/2021: 1. Total coronary calcium score of 32.3. This was 81st percentile for age and sex matched control.   2. Normal coronary origin with right dominance.   3. CAD-RADS = 2 Mild non-obstructive CAD. Left main: Patent. LAD: Minimal (0-24%) mixed plaque within the proximal / mid segment. LCx: Mild stenosis (25-49%) due to noncalcified plaque at mid LCX but accuracy is limited due to artifact. RCA: Mild stenosis (25-49%) due to mixed plaque proximal to mid RCA. 4. Study is sent for CT-FFR to further evaluate the LCX. Findings will be performed and reported separately. 5. No significant incidental findings.  CT-FFR Impression:  1. CT FFR analysis showed no significant stenosis in Left main, LAD, and RCA.  2. Possible hemodynamically significant stenosis within the mid-distal LCx, CT-FFR valves are within the indeterminate zone, clinical correlation required.   Recommendations: Goal directed and uptitration of anti-anginal therapy.  Aggressive risk factor modification for secondary prevention of coronary artery disease.  If symptoms persist despite uptitration of medical therapy consider invasive angiography to further evaluate the LCX.   Heart Catheterization: 07/20/2022 LM: Normal LAD: No significant disease          Diag 2 small to medium caliber. Focal mid 70% stenosis Lcx: Mid long 70% stenosis, RFR 0.94 (physiologically non-significant) RCA: Prox ulcerated 80% stenosis          Mid focal 50% stenosis   Successful OCT guided percutaneous coronary intervention prox RCA     Direct stent placement 4.0 X 16 mm Synergy drug-eluting stent     Post dilatation with 4.5X12 mm [place] balloon ay 16 atm   OCT MLA 3 mm2-->MSA 11 mm2   LABORATORY DATA:    Latest Ref Rng & Units 07/21/2022    2:13 AM 07/20/2022    6:44 PM 07/19/2022    7:57 PM   CBC  WBC 4.0 - 10.5 K/uL 7.7  9.2  12.1   Hemoglobin 13.0 - 17.0 g/dL 16.1  09.6  04.5   Hematocrit 39.0 - 52.0 % 38.0  42.9  43.3   Platelets 150 - 400 K/uL 204  232  259        Latest Ref Rng & Units 07/27/2022    4:39 PM 07/21/2022    2:13 AM 07/20/2022    7:51 AM  CMP  Glucose 70 - 99 mg/dL 409  811  914   BUN 6 - 24 mg/dL 27  13  26  Creatinine 0.76 - 1.27 mg/dL 7.61  6.07  3.71   Sodium 134 - 144 mmol/L 138  140  138   Potassium 3.5 - 5.2 mmol/L 4.1  3.0  3.0   Chloride 96 - 106 mmol/L 100  102  104   CO2 20 - 29 mmol/L 18  25  24    Calcium 8.7 - 10.2 mg/dL 06.2  9.0  8.7     Lipid Panel  Lab Results  Component Value Date   CHOL 169 07/20/2022   HDL 30 (L) 07/20/2022   LDLCALC 65 07/20/2022   LDLDIRECT 88 07/01/2021   TRIG 371 (H) 07/20/2022   CHOLHDL 5.6 07/20/2022    No components found for: "NTPROBNP" No results for input(s): "PROBNP" in the last 8760 hours.  No results for input(s): "TSH" in the last 8760 hours.   BMP Recent Labs    07/19/22 1957 07/20/22 0751 07/21/22 0213 07/27/22 1639  NA 136 138 140 138  K 2.9* 3.0* 3.0* 4.1  CL 102 104 102 100  CO2 22 24 25  18*  GLUCOSE 205* 172* 138* 126*  BUN 11 26* 13 27*  CREATININE 1.17 1.21 1.15 1.24  CALCIUM 9.4 8.7* 9.0 10.8*  GFRNONAA >60 >60 >60  --     HEMOGLOBIN A1C Lab Results  Component Value Date   HGBA1C 5.2 09/19/2016   MPG 103 09/19/2016   External Labs 06/23/2021: Vitamin B12 562   [phone]  Hemoglobin A1C   2021-06-16    Estimated Average Glucose 123      Hemoglobin A1C 5.9   <5.7  Lipid Panel   2021-06-16    Cholesterol 177   <200  Cholesterol / HDL Ratio 5.36   0.00-4.99  HDL Cholesterol 33   >39  LDL Cholesterol (Calculation) SEE COMMENT   <130  LDL/HDL Ratio SEE COMMENT   <3.3  Non-HDL Cholesterol 144   <130  Triglycerides 448   <150   External Labs: Collected: 03/18/2021 A1c 6.8  Total cholesterol 182, triglycerides 233, HDL 32, non-HDL 150, LDL 103. Hemoglobin  14.7, hematocrit 43.3% Sodium 140, potassium 3.7, chloride 103, bicarb 27, BUN 9, creatinine 1.4. AST 32, ALT 59, alkaline phosphatase 113. eGFR 59  Collected 06/22/2022 at PCP   Hemoglobin A1C 7.7 <5.7 % The following HbA1c ranges recommended by the American Diabetes Association  (ADA) may be used as an aid in the diagnosis of diabetes mellitus.  HA1c Suggested Diagnosis  >=6.5% Diabetic  5.7% - 6.4% Pre-Diabetic  <5.7% Non-Diabetic   Estimated Average Glucose 174   Average Glucose is calculated using the equation AG = (28.7 x HgbA1c) - 46.7  based on the guidelines established by the ADA.   Lipid Panel  Cholesterol 147 <200 mg/dL    Triglycerides 694 <854 mg/dL    HDL Cholesterol 32 >62 mg/dL    Cholesterol / HDL Ratio 4.59 0.00-4.99 Ratio    Non-HDL Cholesterol 115 <130 mg/dL    LDL Cholesterol (Calculation) 46 <130 mg/dL LDL Cholesterol Levels*  Less than 100 mg/dL Optimal  703 to 500 mg/dL Near Optimal/ Above Optimal  130 to 159 mg/dL Borderline High  938 to 189 mg/dL High  182 mg/dL and above Very High  * Categories as recommended by the 2004 ATPIII guidelines   LDL/HDL Ratio 1.4 <3.3 Ratio _________________________________________________________________________  LDL Cholesterol Patient History  _________________________________________________________________________  Test Date: 12/16/2021  LDL Results: 33  Units: mg/dL  % Change: -  -------------------------------------------------------------------------  Test Date: 03/19/2022  LDL Results: 51  Units: mg/dL  % Change: +16%  -------------------------------------------------------------------------  Test Date: 06/18/2022  LDL Results: 46  Units: mg/dL  % Change: -9%  _________________________________________________________________________   Sodium 143 136-145 mEq/L    Potassium 3.5 3.5-5.1 mEq/L    Chloride 102 98-107 mEq/L    CO2 27 21-32 mmol/L    Glucose 195 74-106 mg/dL    BUN 10 1-09 mg/dL     Creatinine 6.04 5.40-9.81 mg/dL    Calcium 9.9 1.9-14.7 mg/dL    Protein 7.7 8.2-9.5 g/dL    Albumin 4.4 6.2-1.3 g/dL    Alkaline Phosphatase 158 25-150 IU/L    ALT (SGPT) 108 <6-78 IU/L    AST (SGOT) 48 0-40 IU/L    Bilirubin, Total 0.9 0.2-1.0 mg/dL    Globulin, Calculated 3.3 1.5-4.6 g/dL    Albumin/Globulin Ratio 1.3 1.1-2.5 mg/dL    BUN/Creatinine Ratio 9.3 11.0-26.0 Ratio    GFR/Black 93 >59 mL/min/1.88m2    GFR/White 80 >59 mL/min/1.68m2 GFR Categories in Chronic Kidney Disease (CKD)  GFR Category GFR (mL/min/1.73 sq. meters) Interpretation  G1 90 or greater Normal or high*  G2 60-89 Mild decrease*  G3a 45-59 Mild to moderate decrease  G3b 30-44 Moderate to severe decrease  G4 15-29 Severe decrease  G5 14 or less Kidney failure  *In the absence of kidney damage, neither GFR category G1 or G2 fulfill  the criteria for CKD (Kidney Int Suppl 2013; 3.1-150)  The CKD-EPI calculation is intended for use in patients 38 years of age and  older. Decreased calculation accuracy may be seen in patients taking  medications that affect renal excretion, or in those patients with extremes in  muscle mass or diet.     IMPRESSION:    ICD-10-CM   1. Atherosclerosis of native coronary artery of native heart with stable angina pectoris (HCC)  I25.118 metoprolol succinate (TOPROL-XL) 100 MG 24 hr tablet    Lipid Panel With LDL/HDL Ratio    LDL cholesterol, direct    CMP14+EGFR    Pro b natriuretic peptide (BNP)    2. Status post coronary artery stent placement  Z95.5     3. Agatston coronary artery calcium score less than 100  R93.1 metoprolol succinate (TOPROL-XL) 100 MG 24 hr tablet    4. Heart failure with improved ejection fraction (HFimpEF) (HCC)  I50.32     5. Benign hypertension  I10     6. Controlled type 2 diabetes mellitus without complication, without long-term current use of insulin (HCC)  E11.9     7. Mixed hyperlipidemia  E78.2 Lipid Panel With LDL/HDL Ratio    LDL  cholesterol, direct    CMP14+EGFR    Pro b natriuretic peptide (BNP)    8. Hypertriglyceridemia  E78.1 Lipid Panel With LDL/HDL Ratio    LDL cholesterol, direct    CMP14+EGFR    Pro b natriuretic peptide (BNP)    9. Former tobacco use  Z87.891        RECOMMENDATIONS: Brandon Hogan is a 52 y.o. Caucasian male whose past medical history and cardiac risk factors include: CAD status post PCI to the RCA, mild CAC (32.2AU, 81st percentile), heart failure with improved EF,  hypertension, hyperlipidemia, family history of premature CAD, type 2 diabetes, former smoker, history of excessive alcohol use.   Atherosclerosis of native coronary artery of native heart with stable angina  Status post RCA stent placement  Agatston coronary artery calcium score less than 100 Describe symptoms consistent with stable angina Prior EKG nonischemic Coronary CTA: Total  CAC 32.3, 81st percentile, nonobstructive CAD. Increase Toprol-XL to 100 mg p.o. twice daily Continue Lipitor 80 mg p.o. nightly. Dual antiplatelet therapy for 1 year-July 20, 2023 Reemphasized importance of lipid and triglyceride management. Due to work schedule patient states that he likely will not be able to participate in cardiac rehab.  Overall functional capacity remains stable.  Heart failure with improved ejection fraction (HFimpEF) (HCC) Echo February 2023: LVEF of 45-50% Echo May 2024: LVEF 55-60% Stage B, NYHA class I/II Reemphasized importance of complete alcohol cessation Continue current medical therapy as discussed above. Monitor for now  Benign hypertension Office blood pressures are well-controlled. Medication changes as discussed above  Controlled type 2 diabetes mellitus without complication, without long-term current use of insulin (HCC) Mixed hyperlipidemia Hypertriglyceridemia Sleep apnea Educated him on importance of glycemic control. Continue statin therapy. Reemphasized importance of triglyceride  management. Patient thinks that he will be able to afford Vascepa for a long time.  Will see if he can be qualified for patient assistance Reemphasized importance of compliance with device therapy for sleep apnea  FINAL MEDICATION LIST END OF ENCOUNTER: Meds ordered this encounter  Medications   metoprolol succinate (TOPROL-XL) 100 MG 24 hr tablet    Sig: Take 1 tablet (100 mg total) by mouth 2 (two) times daily. Take with or immediately following a meal.    Dispense:  90 tablet    Refill:  3    Medications Discontinued During This Encounter  Medication Reason   metoprolol succinate (TOPROL-XL) 100 MG 24 hr tablet Reorder     Current Outpatient Medications:    allopurinol (ZYLOPRIM) 300 MG tablet, Take 300 mg by mouth daily., Disp: , Rfl:    aspirin EC 81 MG tablet, Take 81 mg by mouth daily. Swallow whole., Disp: , Rfl:    atorvastatin (LIPITOR) 80 MG tablet, Take 1 tablet (80 mg total) by mouth daily., Disp: 30 tablet, Rfl: 11   FARXIGA 10 MG TABS tablet, TAKE 1 TABLET(10 MG) BY MOUTH DAILY BEFORE BREAKFAST (Patient taking differently: Take 10 mg by mouth daily.), Disp: 90 tablet, Rfl: 0   fenofibrate (TRICOR) 145 MG tablet, Take 1 tablet (145 mg total) by mouth daily., Disp: 90 tablet, Rfl: 0   metFORMIN (GLUCOPHAGE-XR) 500 MG 24 hr tablet, Take 2 tablets (1,000 mg total) by mouth 2 (two) times daily with a meal., Disp: , Rfl: 4   Multiple Vitamin (MULTIVITAMIN WITH MINERALS) TABS tablet, Take 1 tablet by mouth daily., Disp: , Rfl:    sacubitril-valsartan (ENTRESTO) 97-103 MG, Take 1 tablet by mouth 2 (two) times daily., Disp: 180 tablet, Rfl: 3   spironolactone (ALDACTONE) 25 MG tablet, Take 1 tablet (25 mg total) by mouth every morning. TAKE 1 TABLET(25 MG) BY MOUTH EVERY MORNING Strength: 25 mg, Disp: 90 tablet, Rfl: 0   ticagrelor (BRILINTA) 90 MG TABS tablet, Take 1 tablet (90 mg total) by mouth 2 (two) times daily., Disp: 180 tablet, Rfl: 3   VASCEPA 1 g capsule, Take 2 capsules  (2 g total) by mouth 2 (two) times daily., Disp: 360 capsule, Rfl: 0   venlafaxine XR (EFFEXOR-XR) 75 MG 24 hr capsule, Take 75 mg by mouth daily with breakfast., Disp: , Rfl:    metoprolol succinate (TOPROL-XL) 100 MG 24 hr tablet, Take 1 tablet (100 mg total) by mouth 2 (two) times daily. Take with or immediately following a meal., Disp: 90 tablet, Rfl: 3  Orders Placed This Encounter  Procedures   Lipid Panel With LDL/HDL Ratio  LDL cholesterol, direct   NFA21+HYQM   Pro b natriuretic peptide (BNP)    There are no Patient Instructions on file for this visit.   --Continue cardiac medications as reconciled in final medication list. --Return in about 3 months (around 11/18/2022) for Follow up, CAD. Or sooner if needed. --Continue follow-up with your primary care physician regarding the management of your other chronic comorbid conditions.  Patient's questions and concerns were addressed to his satisfaction. He voices understanding of the instructions provided during this encounter.   This note was created using a voice recognition software as a result there may be grammatical errors inadvertently enclosed that do not reflect the nature of this encounter. Every attempt is made to correct such errors.  [name], [place], [facility]  Pager:  [phone] Office: [phone]

## 2022-08-19 ENCOUNTER — Other Ambulatory Visit: Payer: Self-pay

## 2022-08-19 DIAGNOSIS — I25118 Atherosclerotic heart disease of native coronary artery with other forms of angina pectoris: Secondary | ICD-10-CM

## 2022-08-19 DIAGNOSIS — E782 Mixed hyperlipidemia: Secondary | ICD-10-CM

## 2022-08-19 DIAGNOSIS — E781 Pure hyperglyceridemia: Secondary | ICD-10-CM

## 2022-08-24 ENCOUNTER — Telehealth: Payer: Self-pay

## 2022-08-24 NOTE — Telephone Encounter (Signed)
Patient called back regarding his vascepa, [facility] will not help him its still going to be almost 200 dollars,there is no rep that I am aware of I have reached out to the old rep and she cannot help. He sid he will do what he has to do to get the meds it is a little cheaper if he uses [facility] , but not much

## 2022-08-24 NOTE — Telephone Encounter (Signed)
His last TAG was 371mg /dL and should be less than 149mg /dL given his recent stents this is despite being on fenofibrate.  Let me know his thoughts.  Moselle Rister [place], DO, [facility]

## 2022-08-26 ENCOUNTER — Other Ambulatory Visit: Payer: Self-pay

## 2022-08-26 DIAGNOSIS — R931 Abnormal findings on diagnostic imaging of heart and coronary circulation: Secondary | ICD-10-CM

## 2022-08-26 DIAGNOSIS — I2510 Atherosclerotic heart disease of native coronary artery without angina pectoris: Secondary | ICD-10-CM

## 2022-08-26 MED ORDER — VASCEPA 1 G PO CAPS
2.0000 g | ORAL_CAPSULE | Freq: Two times a day (BID) | ORAL | 0 refills | Status: DC
Start: 2022-08-26 — End: 2022-11-19

## 2022-08-26 NOTE — Telephone Encounter (Signed)
Pt agrees to pay the cost I sent in the meds

## 2022-09-04 ENCOUNTER — Telehealth: Payer: Self-pay

## 2022-09-04 NOTE — Telephone Encounter (Signed)
Patient wants to know what other medication he can take since Vascepa will not be covered by his insurance?

## 2022-09-08 NOTE — Telephone Encounter (Signed)
He is already on fenofibrate. Not sure why insurance would not cover Vascepa given the fact that he has had coronary stents.   Could be appeal this or a letter for it to be reconsidered?  Robinette Esters [place], DO, [facility]

## 2022-09-10 NOTE — Telephone Encounter (Signed)
Thank you :)

## 2022-09-24 ENCOUNTER — Other Ambulatory Visit: Payer: Self-pay

## 2022-09-24 MED ORDER — ATORVASTATIN CALCIUM 80 MG PO TABS: 80.0000 mg | ORAL_TABLET | Freq: Every day | ORAL | 1 refills | Status: DC

## 2022-10-05 ENCOUNTER — Other Ambulatory Visit: Payer: Self-pay | Admitting: Cardiology

## 2022-10-05 DIAGNOSIS — E781 Pure hyperglyceridemia: Secondary | ICD-10-CM

## 2022-10-05 DIAGNOSIS — R931 Abnormal findings on diagnostic imaging of heart and coronary circulation: Secondary | ICD-10-CM

## 2022-10-05 DIAGNOSIS — I25118 Atherosclerotic heart disease of native coronary artery with other forms of angina pectoris: Secondary | ICD-10-CM

## 2022-10-20 ENCOUNTER — Other Ambulatory Visit: Payer: Self-pay | Admitting: Cardiology

## 2022-10-20 DIAGNOSIS — R072 Precordial pain: Secondary | ICD-10-CM

## 2022-10-20 DIAGNOSIS — I10 Essential (primary) hypertension: Secondary | ICD-10-CM

## 2022-11-17 ENCOUNTER — Encounter: Payer: Self-pay | Admitting: *Deleted

## 2022-11-18 ENCOUNTER — Other Ambulatory Visit: Payer: Self-pay | Admitting: Cardiology

## 2022-11-18 DIAGNOSIS — I25118 Atherosclerotic heart disease of native coronary artery with other forms of angina pectoris: Secondary | ICD-10-CM

## 2022-11-18 DIAGNOSIS — R931 Abnormal findings on diagnostic imaging of heart and coronary circulation: Secondary | ICD-10-CM

## 2022-11-18 DIAGNOSIS — R072 Precordial pain: Secondary | ICD-10-CM

## 2022-11-18 DIAGNOSIS — I10 Essential (primary) hypertension: Secondary | ICD-10-CM

## 2022-11-19 ENCOUNTER — Ambulatory Visit: Payer: BC Managed Care – PPO | Admitting: Cardiology

## 2022-11-19 ENCOUNTER — Encounter: Payer: Self-pay | Admitting: Cardiology

## 2022-11-19 VITALS — BP 105/70 | HR 79 | Resp 16 | Ht 74.0 in | Wt 223.0 lb

## 2022-11-19 DIAGNOSIS — E781 Pure hyperglyceridemia: Secondary | ICD-10-CM

## 2022-11-19 DIAGNOSIS — E782 Mixed hyperlipidemia: Secondary | ICD-10-CM

## 2022-11-19 DIAGNOSIS — I5032 Chronic diastolic (congestive) heart failure: Secondary | ICD-10-CM

## 2022-11-19 DIAGNOSIS — I10 Essential (primary) hypertension: Secondary | ICD-10-CM

## 2022-11-19 DIAGNOSIS — Z955 Presence of coronary angioplasty implant and graft: Secondary | ICD-10-CM

## 2022-11-19 DIAGNOSIS — E119 Type 2 diabetes mellitus without complications: Secondary | ICD-10-CM

## 2022-11-19 DIAGNOSIS — I2510 Atherosclerotic heart disease of native coronary artery without angina pectoris: Secondary | ICD-10-CM

## 2022-11-19 DIAGNOSIS — R931 Abnormal findings on diagnostic imaging of heart and coronary circulation: Secondary | ICD-10-CM

## 2022-11-19 MED ORDER — VASCEPA 1 G PO CAPS
2.0000 g | ORAL_CAPSULE | Freq: Two times a day (BID) | ORAL | 0 refills | Status: DC
Start: 2022-11-19 — End: 2023-05-14

## 2022-11-19 NOTE — Progress Notes (Signed)
ID:  Brandon Hogan, DOB 1970-12-26, MRN [id number]  PCP:  [name], NP  Cardiologist:  [name], DO, [facility] (established care 05/20/2021) Former Cardiology Providers: Donnelly Stager, NP Sleep medicine provider: Dr. Vickey Huger  Date: 11/19/22 Last Office Visit: 08/18/2022  Chief Complaint  Patient presents with   Coronary Artery Disease   Follow-up   HPI  Brandon Hogan is a 52 y.o. Caucasian male whose past medical history and cardiovascular risk factors include: CAD status post PCI to the RCA, mild CAC (32.2AU, 81st percentile), heart failure with improved EF,  hypertension, hyperlipidemia, family history of premature CAD, type 2 diabetes, former smoker, history of excessive alcohol use.   Patient ruled in for NSTEMI in April 2024 and had coronary interventions to the RCA was recommended to be on dual antiplatelet therapy for 1 year.  He presents today for 69-month follow-up visit.  Over the last 3 months he denies anginal chest pain he has some nonspecific precordial discomfort which is not brought on by effort and does not resolve with rest.  He is not exercising on a regular basis as instructed before and due to work limitations with unable to participate in cardiac rehab.  He is using his CPAP on a regular basis given his sleep apnea.  Given his history of hypertriglyceridemia despite being on fenofibrate and recent ACS with coronary interventions I had prescribed Vascepa but patient states that it was not approved by insurance.  He had labs in June 2024 which are available in [facility] which were independently reviewed and noted below for further reference.  Family history of premature CAD with both father and brother having MI in their 73s and they both went bypass surgery.  Mom died at the age of 25 due to myocardial infarction.  About 10 years ago he used to drink 12-14 bottles of beer on a daily basis for 4-5 years.  Currently works as an Scientist, product/process development at  [employer].   FUNCTIONAL STATUS: No structured exercise program or daily routine.   ALLERGIES: No Known Allergies  MEDICATION LIST PRIOR TO VISIT: Current Meds  Medication Sig   allopurinol (ZYLOPRIM) 300 MG tablet Take 300 mg by mouth daily.   aspirin EC 81 MG tablet Take 81 mg by mouth daily. Swallow whole.   atorvastatin (LIPITOR) 80 MG tablet Take 1 tablet (80 mg total) by mouth daily.   FARXIGA 10 MG TABS tablet TAKE 1 TABLET(10 MG) BY MOUTH DAILY BEFORE BREAKFAST (Patient taking differently: Take 10 mg by mouth daily.)   fenofibrate (TRICOR) 145 MG tablet TAKE 1 TABLET(145 MG) BY MOUTH DAILY   metFORMIN (GLUCOPHAGE-XR) 500 MG 24 hr tablet Take 2 tablets (1,000 mg total) by mouth 2 (two) times daily with a meal.   metoprolol succinate (TOPROL-XL) 100 MG 24 hr tablet TAKE 1 TABLET(100 MG) BY MOUTH TWICE DAILY WITH OR IMMEDIATELY FOLLOWING A MEAL   Multiple Vitamin (MULTIVITAMIN WITH MINERALS) TABS tablet Take 1 tablet by mouth daily.   sacubitril-valsartan (ENTRESTO) 97-103 MG Take 1 tablet by mouth 2 (two) times daily.   spironolactone (ALDACTONE) 25 MG tablet TAKE 1 TABLET BY MOUTH EVERY MORNING   ticagrelor (BRILINTA) 90 MG TABS tablet Take 1 tablet (90 mg total) by mouth 2 (two) times daily.   venlafaxine XR (EFFEXOR-XR) 75 MG 24 hr capsule Take 75 mg by mouth daily with breakfast.     PAST MEDICAL HISTORY: Past Medical History:  Diagnosis Date   Cardiomyopathy (HCC)    Choledocholithiasis  09/16/2016   Diabetes mellitus without complication (HCC)    Elevated LFTs 09/16/2016   Gallstone    Hyperlipidemia    Hypertension    Pancreatitis: Post ERCP mild pancreatitis 09/20/2016    PAST SURGICAL HISTORY: Past Surgical History:  Procedure Laterality Date   CHOLECYSTECTOMY N/A 09/18/2016   Procedure: LAPAROSCOPIC CHOLECYSTECTOMY;  Surgeon: Almond Lint, MD;  Location: [facility] OR;  Service: General;  Laterality: N/A;   CHOLECYSTECTOMY  2018   CORONARY IMAGING/OCT  Right 07/20/2022   Procedure: CORONARY IMAGING/OCT;  Surgeon: Elder Negus, MD;  Location: [facility] INVASIVE CV LAB;  Service: Cardiovascular;  Laterality: Right;  prox. RCA   CORONARY STENT INTERVENTION Right 07/20/2022   Procedure: CORONARY STENT INTERVENTION;  Surgeon: Elder Negus, MD;  Location: [facility] INVASIVE CV LAB;  Service: Cardiovascular;  Laterality: Right;  prox. RCA   ERCP N/A 09/18/2016   Procedure: ENDOSCOPIC RETROGRADE CHOLANGIOPANCREATOGRAPHY (ERCP);  Surgeon: Iva Boop, MD;  Location: [facility] OR;  Service: Endoscopy;  Laterality: N/A;   LEFT HEART CATH AND CORONARY ANGIOGRAPHY N/A 07/20/2022   Procedure: LEFT HEART CATH AND CORONARY ANGIOGRAPHY;  Surgeon: Elder Negus, MD;  Location: [facility] INVASIVE CV LAB;  Service: Cardiovascular;  Laterality: N/A;    FAMILY HISTORY: The patient family history includes Gallbladder disease in his father; Heart attack in his mother; Heart attack (age of onset: 79) in his brother and father.  SOCIAL HISTORY:  The patient  reports that he quit smoking about 11 years ago. His smoking use included cigarettes. He started smoking about 36 years ago. He has a 50 pack-year smoking history. He has never used smokeless tobacco. He reports current alcohol use. He reports that he does not use drugs.  REVIEW OF SYSTEMS: Review of Systems  Cardiovascular:  Negative for chest pain, claudication, dyspnea on exertion, irregular heartbeat, leg swelling, near-syncope, orthopnea, palpitations, paroxysmal nocturnal dyspnea and syncope.  Respiratory:  Negative for shortness of breath.   Hematologic/Lymphatic: Negative for bleeding problem.  Musculoskeletal:  Negative for muscle cramps and myalgias.  Neurological:  Negative for dizziness and light-headedness.    PHYSICAL EXAM:    11/19/2022    3:20 PM 08/18/2022    2:18 PM 07/27/2022    3:20 PM  Vitals with BMI  Height 6\' 2"  6\' 2"  6\' 2"   Weight 223 lbs 225 lbs 6 oz 225 lbs 6 oz  BMI 28.62 28.93  28.93  Systolic 105 119 161  Diastolic 70 79 87  Pulse 79 90 98   Physical Exam  Constitutional: No distress.  Age appropriate, hemodynamically stable.   Neck: No JVD present.  Cardiovascular: Normal rate, regular rhythm, S1 normal, S2 normal, intact distal pulses and normal pulses. Exam reveals no gallop, no S3 and no S4.  No murmur heard. Pulses:      Dorsalis pedis pulses are 2+ on the right side and 2+ on the left side.       Posterior tibial pulses are 2+ on the right side and 2+ on the left side.  Pulmonary/Chest: Effort normal and breath sounds normal. No stridor. He has no wheezes. He has no rales.  Abdominal: Soft. Bowel sounds are normal. He exhibits no distension. There is no abdominal tenderness.  Musculoskeletal:        General: No edema.     Cervical back: Neck supple.  Neurological: He is alert and oriented to person, place, and time. He has intact cranial nerves (2-12).  Skin: Skin is warm and moist.   CARDIAC DATABASE:  EKG: 07/10/2022: Normal sinus rhythm, 87 bpm, normal axis, without underlying ischemia or injury pattern.  Echocardiogram: 05/22/2021:  Left ventricle cavity is normal in size. Normal left ventricular wall thickness. Hypokinetic global wall motion. Normal diastolic filling pattern. Mildly depressed LV systolic function with visual EF 45-50%. Left atrial cavity is mildly dilated. Compared to 08/19/2018, LVEF previously reported at 50 to 55%, however on 08/11/2016, EF was reported to be 40 to 45%. Overall no significant change.  08/10/2022: Normal LV systolic function with visual EF 55-60%. Left ventricle cavity is normal in size. Normal left ventricular wall thickness. Normal global wall motion. Normal diastolic filling pattern, normal LAP. Calculated EF 56%. Structurally normal tricuspid valve with trace regurgitation. No evidence of pulmonary hypertension. Compared to 05/2021, EF has improved from 45-50% to 55-60%.   Stress Testing: No results found for  this or any previous visit from the past 1095 days.  CCTA 06/18/2021: 1. Total coronary calcium score of 32.3. This was 81st percentile for age and sex matched control.   2. Normal coronary origin with right dominance.   3. CAD-RADS = 2 Mild non-obstructive CAD. Left main: Patent. LAD: Minimal (0-24%) mixed plaque within the proximal / mid segment. LCx: Mild stenosis (25-49%) due to noncalcified plaque at mid LCX but accuracy is limited due to artifact. RCA: Mild stenosis (25-49%) due to mixed plaque proximal to mid RCA. 4. Study is sent for CT-FFR to further evaluate the LCX. Findings will be performed and reported separately. 5. No significant incidental findings.  CT-FFR Impression:  1. CT FFR analysis showed no significant stenosis in Left main, LAD, and RCA.  2. Possible hemodynamically significant stenosis within the mid-distal LCx, CT-FFR valves are within the indeterminate zone, clinical correlation required.   Recommendations: Goal directed and uptitration of anti-anginal therapy.  Aggressive risk factor modification for secondary prevention of coronary artery disease.  If symptoms persist despite uptitration of medical therapy consider invasive angiography to further evaluate the LCX.   Heart Catheterization: 07/20/2022 LM: Normal LAD: No significant disease          Diag 2 small to medium caliber. Focal mid 70% stenosis Lcx: Mid long 70% stenosis, RFR 0.94 (physiologically non-significant) RCA: Prox ulcerated 80% stenosis          Mid focal 50% stenosis   Successful OCT guided percutaneous coronary intervention prox RCA     Direct stent placement 4.0 X 16 mm Synergy drug-eluting stent     Post dilatation with 4.5X12 mm Pelham balloon ay 16 atm   OCT MLA 3 mm2-->MSA 11 mm2   LABORATORY DATA:    Latest Ref Rng & Units 07/21/2022    2:13 AM 07/20/2022    6:44 PM 07/19/2022    7:57 PM  CBC  WBC 4.0 - 10.5 K/uL 7.7  9.2  12.1   Hemoglobin 13.0 - 17.0 g/dL 09.8  11.9   14.7   Hematocrit 39.0 - 52.0 % 38.0  42.9  43.3   Platelets 150 - 400 K/uL 204  232  259        Latest Ref Rng & Units 07/27/2022    4:39 PM 07/21/2022    2:13 AM 07/20/2022    7:51 AM  CMP  Glucose 70 - 99 mg/dL 829  562  130   BUN 6 - 24 mg/dL 27  13  26    Creatinine 0.76 - 1.27 mg/dL 8.65  7.84  6.96   Sodium 134 - 144 mmol/L 138  140  138  Potassium 3.5 - 5.2 mmol/L 4.1  3.0  3.0   Chloride 96 - 106 mmol/L 100  102  104   CO2 20 - 29 mmol/L 18  25  24    Calcium 8.7 - 10.2 mg/dL 78.2  9.0  8.7     Lipid Panel  Lab Results  Component Value Date   CHOL 169 07/20/2022   HDL 30 (L) 07/20/2022   LDLCALC 65 07/20/2022   LDLDIRECT 88 07/01/2021   TRIG 371 (H) 07/20/2022   CHOLHDL 5.6 07/20/2022     No components found for: "NTPROBNP" No results for input(s): "PROBNP" in the last 8760 hours.  No results for input(s): "TSH" in the last 8760 hours.   BMP Recent Labs    07/19/22 1957 07/20/22 0751 07/21/22 0213 07/27/22 1639  NA 136 138 140 138  K 2.9* 3.0* 3.0* 4.1  CL 102 104 102 100  CO2 22 24 25  18*  GLUCOSE 205* 172* 138* 126*  BUN 11 26* 13 27*  CREATININE 1.17 1.21 1.15 1.24  CALCIUM 9.4 8.7* 9.0 10.8*  GFRNONAA >60 >60 >60  --     External Labs 06/23/2021: Vitamin B12 562   [phone]  Hemoglobin A1C   2021-06-16    Estimated Average Glucose 123      Hemoglobin A1C 5.9   <5.7  Lipid Panel   2021-06-16    Cholesterol 177   <200  Cholesterol / HDL Ratio 5.36   0.00-4.99  HDL Cholesterol 33   >39  LDL Cholesterol (Calculation) SEE COMMENT   <130  LDL/HDL Ratio SEE COMMENT   <3.3  Non-HDL Cholesterol 144   <130  Triglycerides 448   <150   External Labs: Collected: 03/18/2021 A1c 6.8  Total cholesterol 182, triglycerides 233, HDL 32, non-HDL 150, LDL 103. Hemoglobin 14.7, hematocrit 43.3% Sodium 140, potassium 3.7, chloride 103, bicarb 27, BUN 9, creatinine 1.4. AST 32, ALT 59, alkaline phosphatase 113. eGFR 59  Collected 06/22/2022 at PCP    Hemoglobin A1C 7.7 <5.7 % The following HbA1c ranges recommended by the American Diabetes Association  (ADA) may be used as an aid in the diagnosis of diabetes mellitus.  HA1c Suggested Diagnosis  >=6.5% Diabetic  5.7% - 6.4% Pre-Diabetic  <5.7% Non-Diabetic   Estimated Average Glucose 174   Average Glucose is calculated using the equation AG = (28.7 x HgbA1c) - 46.7  based on the guidelines established by the ADA.   Lipid Panel  Cholesterol 147 <200 mg/dL    Triglycerides 956 <213 mg/dL    HDL Cholesterol 32 >08 mg/dL    Cholesterol / HDL Ratio 4.59 0.00-4.99 Ratio    Non-HDL Cholesterol 115 <130 mg/dL    LDL Cholesterol (Calculation) 46 <130 mg/dL LDL Cholesterol Levels*  Less than 100 mg/dL Optimal  657 to 846 mg/dL Near Optimal/ Above Optimal  130 to 159 mg/dL Borderline High  962 to 189 mg/dL High  952 mg/dL and above Very High  * Categories as recommended by the 2004 ATPIII guidelines   LDL/HDL Ratio 1.4 <3.3 Ratio _________________________________________________________________________  LDL Cholesterol Patient History  _________________________________________________________________________  Test Date: 12/16/2021  LDL Results: 33  Units: mg/dL  % Change: -  -------------------------------------------------------------------------  Test Date: 03/19/2022  LDL Results: 51  Units: mg/dL  % Change: +84%  -------------------------------------------------------------------------  Test Date: 06/18/2022  LDL Results: 46  Units: mg/dL  % Change: -9%  _________________________________________________________________________   Sodium 143 136-145 mEq/L    Potassium 3.5 3.5-5.1 mEq/L    Chloride 102  98-107 mEq/L    CO2 27 21-32 mmol/L    Glucose 195 74-106 mg/dL    BUN 10 4-09 mg/dL    Creatinine 8.11 9.14-7.82 mg/dL    Calcium 9.9 9.5-62.1 mg/dL    Protein 7.7 3.0-8.6 g/dL    Albumin 4.4 5.7-8.4 g/dL    Alkaline Phosphatase 158 25-150 IU/L    ALT (SGPT) 108 <6-78 IU/L     AST (SGOT) 48 0-40 IU/L    Bilirubin, Total 0.9 0.2-1.0 mg/dL    Globulin, Calculated 3.3 1.5-4.6 g/dL    Albumin/Globulin Ratio 1.3 1.1-2.5 mg/dL    BUN/Creatinine Ratio 9.3 11.0-26.0 Ratio    GFR/Black 93 >59 mL/min/1.28m2    GFR/White 80 >59 mL/min/1.38m2 GFR Categories in Chronic Kidney Disease (CKD)  GFR Category GFR (mL/min/1.73 sq. meters) Interpretation  G1 90 or greater Normal or high*  G2 60-89 Mild decrease*  G3a 45-59 Mild to moderate decrease  G3b 30-44 Moderate to severe decrease  G4 15-29 Severe decrease  G5 14 or less Kidney failure  *In the absence of kidney damage, neither GFR category G1 or G2 fulfill  the criteria for CKD (Kidney Int Suppl 2013; 3.1-150)  The CKD-EPI calculation is intended for use in patients 78 years of age and  older. Decreased calculation accuracy may be seen in patients taking  medications that affect renal excretion, or in those patients with extremes in  muscle mass or diet.    External Labs: Collected: September 23, 2022 in [facility]. Total cholesterol 164, triglycerides 358, HDL 28, LDL calculated 78. Hemoglobin A1c 6.3 BUN 26, creatinine 1.58. eGFR 53. Sodium 140, potassium 4.5, chloride 103, bicarb 22. AST 26, ALT 35, alkaline phosphatase 70 Hemoglobin 14.8, hematocrit 46.5  IMPRESSION:    ICD-10-CM   1. Atherosclerosis of native coronary artery of native heart without angina pectoris  I25.10 Lipid Panel With LDL/HDL Ratio    LDL cholesterol, direct    Comp. Metabolic Panel (12)    VASCEPA 1 g capsule    2. Status post coronary artery stent placement  Z95.5 Lipid Panel With LDL/HDL Ratio    LDL cholesterol, direct    Comp. Metabolic Panel (12)    VASCEPA 1 g capsule    3. Agatston coronary artery calcium score less than 100  R93.1     4. Heart failure with improved ejection fraction (HFimpEF) (HCC)  I50.32     5. Benign hypertension  I10     6. Controlled type 2 diabetes mellitus without complication, without  long-term current use of insulin (HCC)  E11.9 VASCEPA 1 g capsule    7. Mixed hyperlipidemia  E78.2 Lipid Panel With LDL/HDL Ratio    LDL cholesterol, direct    Comp. Metabolic Panel (12)    8. Hypertriglyceridemia  E78.1 Lipid Panel With LDL/HDL Ratio    LDL cholesterol, direct    Comp. Metabolic Panel (12)    VASCEPA 1 g capsule        RECOMMENDATIONS: ZACKERI DOUCETTE is a 52 y.o. Caucasian male whose past medical history and cardiac risk factors include: CAD status post PCI to the RCA, mild CAC (32.2AU, 81st percentile), heart failure with improved EF,  hypertension, hyperlipidemia, family history of premature CAD, type 2 diabetes, former smoker, history of excessive alcohol use.   Atherosclerosis of native coronary artery of native heart without stable angina  Status post RCA stent placement  Agatston coronary artery calcium score less than 100 His symptoms now are not suggestive of stable angina.  Some  nonspecific chest discomfort. Patient is aware to seek medical attention if he has anginal chest pain or his anginal equivalents like what he experienced back in April 2024.Marland Kitchen Prior EKG nonischemic. Coronary CTA: Total CAC 32.3, 81st percentile, nonobstructive CAD. Medications reconciled. Dual antiplatelet therapy for 1 year-July 20, 2023 Reemphasized importance of lipid and triglyceride management. Patient states that the Vascepa prescription was not filled at the last office visit due to lack of insurance coverage. Outside labs from June 2024 still illustrate that the triglycerides are not well-controlled on fenofibrate.  Will send in a prescription for Vascepa again. Recommend a goal LDL <55 mg/dL. Recommend a goal triglyceride levels <149 mg/dL. Shared decision was to repeat lipids in 1 week and if LDL is not at goal patient is willing to be on PCSK9 inhibitors.  Heart failure with improved ejection fraction (HFimpEF) (HCC) Echo February 2023: LVEF of 45-50% Echo May  2024: LVEF 55-60% Stage B, NYHA class I/II Reemphasized importance of complete alcohol cessation. Medications reconciled. Patient's blood pressure at today's office visit soft. Home blood pressures are around 120 mmHg. Recent labs from June 2024 also notes degree of AKI.  Patient states that he could do better with hydration-I have asked him to increase water intake by 3 to 4 glasses/day for the next 1 week and will repeat labs.  If renal function is still not back to baseline we will consider down titrating medical therapy  Controlled type 2 diabetes mellitus without complication, without long-term current use of insulin (HCC) Mixed hyperlipidemia Hypertriglyceridemia Sleep apnea Educated him on importance of glycemic control. Already on maximally tolerated doses of statin and triglyceride therapy  Given his young age, recent coronary interventions, and strong family history recommend a goal LDL <55 mg/dL and goal triglyceride levels less than 149 mg/dL  If patient has difficulty getting either PCSK9 or Vascepa covered we will discuss Ozempic initiation. Reemphasized importance of compliance with device therapy for sleep apnea.  Patient was asking for clearance for upcoming screening colonoscopy in December 2024.  Given his recent coronary intervention in the setting of NSTEMI would recommend uninterrupted dual antiplatelet therapy for at least 1 year. However, if the colonoscopy is needed to be done urgently this can be revisited.  Patient states that he will reach out to his gastroenterologist and get further guidance.  FINAL MEDICATION LIST END OF ENCOUNTER: Meds ordered this encounter  Medications   VASCEPA 1 g capsule    Sig: Take 2 capsules (2 g total) by mouth 2 (two) times daily.    Dispense:  360 capsule    Refill:  0    Medications Discontinued During This Encounter  Medication Reason   VASCEPA 1 g capsule      Current Outpatient Medications:    allopurinol (ZYLOPRIM)  300 MG tablet, Take 300 mg by mouth daily., Disp: , Rfl:    aspirin EC 81 MG tablet, Take 81 mg by mouth daily. Swallow whole., Disp: , Rfl:    atorvastatin (LIPITOR) 80 MG tablet, Take 1 tablet (80 mg total) by mouth daily., Disp: 90 tablet, Rfl: 1   FARXIGA 10 MG TABS tablet, TAKE 1 TABLET(10 MG) BY MOUTH DAILY BEFORE BREAKFAST (Patient taking differently: Take 10 mg by mouth daily.), Disp: 90 tablet, Rfl: 0   fenofibrate (TRICOR) 145 MG tablet, TAKE 1 TABLET(145 MG) BY MOUTH DAILY, Disp: 90 tablet, Rfl: 0   metFORMIN (GLUCOPHAGE-XR) 500 MG 24 hr tablet, Take 2 tablets (1,000 mg total) by mouth 2 (two) times daily  with a meal., Disp: , Rfl: 4   metoprolol succinate (TOPROL-XL) 100 MG 24 hr tablet, TAKE 1 TABLET(100 MG) BY MOUTH TWICE DAILY WITH OR IMMEDIATELY FOLLOWING A MEAL, Disp: 90 tablet, Rfl: 3   Multiple Vitamin (MULTIVITAMIN WITH MINERALS) TABS tablet, Take 1 tablet by mouth daily., Disp: , Rfl:    sacubitril-valsartan (ENTRESTO) 97-103 MG, Take 1 tablet by mouth 2 (two) times daily., Disp: 180 tablet, Rfl: 3   spironolactone (ALDACTONE) 25 MG tablet, TAKE 1 TABLET BY MOUTH EVERY MORNING, Disp: 90 tablet, Rfl: 0   ticagrelor (BRILINTA) 90 MG TABS tablet, Take 1 tablet (90 mg total) by mouth 2 (two) times daily., Disp: 180 tablet, Rfl: 3   venlafaxine XR (EFFEXOR-XR) 75 MG 24 hr capsule, Take 75 mg by mouth daily with breakfast., Disp: , Rfl:    VASCEPA 1 g capsule, Take 2 capsules (2 g total) by mouth 2 (two) times daily., Disp: 360 capsule, Rfl: 0  Orders Placed This Encounter  Procedures   Lipid Panel With LDL/HDL Ratio   LDL cholesterol, direct   Comp. Metabolic Panel (12)    There are no Patient Instructions on file for this visit.   --Continue cardiac medications as reconciled in final medication list. --Return in about 6 months (around 05/22/2023) for Follow up, CAD. Or sooner if needed. --Continue follow-up with your primary care physician regarding the management of your  other chronic comorbid conditions.  Patient's questions and concerns were addressed to his satisfaction. He voices understanding of the instructions provided during this encounter.   This note was created using a voice recognition software as a result there may be grammatical errors inadvertently enclosed that do not reflect the nature of this encounter. Every attempt is made to correct such errors.  [name], [place], [facility]  Pager:  [phone] Office: [phone]

## 2022-12-15 ENCOUNTER — Other Ambulatory Visit: Payer: Self-pay | Admitting: Cardiology

## 2022-12-15 DIAGNOSIS — I25118 Atherosclerotic heart disease of native coronary artery with other forms of angina pectoris: Secondary | ICD-10-CM

## 2022-12-15 DIAGNOSIS — R931 Abnormal findings on diagnostic imaging of heart and coronary circulation: Secondary | ICD-10-CM

## 2022-12-15 DIAGNOSIS — E781 Pure hyperglyceridemia: Secondary | ICD-10-CM

## 2022-12-17 ENCOUNTER — Other Ambulatory Visit: Payer: Self-pay | Admitting: Cardiology

## 2022-12-17 DIAGNOSIS — R931 Abnormal findings on diagnostic imaging of heart and coronary circulation: Secondary | ICD-10-CM

## 2022-12-17 DIAGNOSIS — E781 Pure hyperglyceridemia: Secondary | ICD-10-CM

## 2022-12-17 DIAGNOSIS — I25118 Atherosclerotic heart disease of native coronary artery with other forms of angina pectoris: Secondary | ICD-10-CM

## 2023-02-02 ENCOUNTER — Telehealth: Payer: Self-pay | Admitting: *Deleted

## 2023-02-02 NOTE — Telephone Encounter (Signed)
   Pre-operative Risk Assessment    Patient Name: Brandon Hogan  DOB: 08-13-1970 MRN: [id number]    DATE OF LAST VISIT: 11/19/22 DR. TOLIA DATE OF NEXT VISIT: 05/14/23 DR. TOLIA  Request for Surgical Clearance    Procedure:   COLONOSCOPY  Date of Surgery:  Clearance 03/09/23                                 Surgeon:  DR. HUNG Surgeon's Group or Practice Name:  [facility] Phone number:  [phone] Fax number:  [phone]   Type of Clearance Requested:   - Medical  - Pharmacy:  Hold Aspirin and Ticagrelor (Brilinta) NOT INDICATED IF NEEDING TO BE HELD ON CLEARANCE FORM   Type of Anesthesia:   PROPOFOL   Additional requests/questions:    Elpidio Anis   02/02/2023, 5:58 PM

## 2023-02-03 NOTE — Telephone Encounter (Signed)
Callback team please contact requesting provider office and inform that patient had a PCI on 07/2022 and was instructed to be on DAPT uninterrupted for 12 months.  It was not indicated if Brilinta or aspirin was needing to be held.  If they are requiring these medications to be held he will need to reschedule his upper endoscopy due to recent PCI.  Thanks,

## 2023-02-03 NOTE — Telephone Encounter (Signed)
I s/w Brandon Hogan, with Dr. Haywood Pao office. I did confirm with [name] will need to hold Brilinta. I did review the notes per pre op APP today due to PCI 07/2022 and DAPT to not be interrupted  x 12 months. Per Brandon Hogan will reschedule once able to hold Brilinta and ASA.   I assured Brandon Hogan that I will fax notes to their office.   We will need a new clearance request to be faxed to our office when ready to reschedule.

## 2023-02-12 NOTE — Telephone Encounter (Signed)
Dr. Haywood Pao office sent a duplicate request.   Please see the notes from Robin Searing, NP:  Callback team please contact requesting provider office and inform that patient had a PCI on 07/2022 and was instructed to be on DAPT uninterrupted for 12 months.  It was not indicated if Brilinta or aspirin was needing to be held.  If they are requiring these medications to be held he will need to reschedule his upper endoscopy due to recent PCI.   Thanks,    Me:  I s/w Ashley, with Dr. Haywood Pao office. I did confirm with [name] will need to hold Brilinta. I did review the notes per pre op APP today due to PCI 07/2022 and DAPT to not be interrupted  x 12 months. Per Morrie Sheldon will reschedule once able to hold Brilinta and ASA.    I assured Morrie Sheldon that I will fax notes to their office.    We will need a new clearance request to be faxed to our office when ready to reschedule.

## 2023-02-16 ENCOUNTER — Other Ambulatory Visit: Payer: Self-pay | Admitting: Cardiology

## 2023-02-16 DIAGNOSIS — R072 Precordial pain: Secondary | ICD-10-CM

## 2023-02-16 DIAGNOSIS — I10 Essential (primary) hypertension: Secondary | ICD-10-CM

## 2023-03-12 ENCOUNTER — Encounter: Payer: Self-pay | Admitting: Adult Health

## 2023-03-15 ENCOUNTER — Ambulatory Visit: Payer: BC Managed Care – PPO | Admitting: Adult Health

## 2023-03-26 ENCOUNTER — Other Ambulatory Visit: Payer: Self-pay | Admitting: Cardiology

## 2023-03-26 DIAGNOSIS — E781 Pure hyperglyceridemia: Secondary | ICD-10-CM

## 2023-03-26 DIAGNOSIS — I25118 Atherosclerotic heart disease of native coronary artery with other forms of angina pectoris: Secondary | ICD-10-CM

## 2023-03-26 DIAGNOSIS — R931 Abnormal findings on diagnostic imaging of heart and coronary circulation: Secondary | ICD-10-CM

## 2023-05-14 ENCOUNTER — Ambulatory Visit: Payer: BC Managed Care – PPO | Attending: Cardiology | Admitting: Cardiology

## 2023-05-14 ENCOUNTER — Encounter: Payer: Self-pay | Admitting: Cardiology

## 2023-05-14 VITALS — BP 110/68 | HR 75 | Resp 16 | Ht 74.0 in | Wt 232.0 lb

## 2023-05-14 DIAGNOSIS — E782 Mixed hyperlipidemia: Secondary | ICD-10-CM

## 2023-05-14 DIAGNOSIS — E119 Type 2 diabetes mellitus without complications: Secondary | ICD-10-CM

## 2023-05-14 DIAGNOSIS — I10 Essential (primary) hypertension: Secondary | ICD-10-CM

## 2023-05-14 DIAGNOSIS — R931 Abnormal findings on diagnostic imaging of heart and coronary circulation: Secondary | ICD-10-CM

## 2023-05-14 DIAGNOSIS — I2510 Atherosclerotic heart disease of native coronary artery without angina pectoris: Secondary | ICD-10-CM | POA: Diagnosis not present

## 2023-05-14 DIAGNOSIS — E781 Pure hyperglyceridemia: Secondary | ICD-10-CM

## 2023-05-14 DIAGNOSIS — I5032 Chronic diastolic (congestive) heart failure: Secondary | ICD-10-CM

## 2023-05-14 DIAGNOSIS — Z955 Presence of coronary angioplasty implant and graft: Secondary | ICD-10-CM

## 2023-05-14 NOTE — Patient Instructions (Signed)
 Medication Instructions:  Pharm D referral to start Repatha  140 sbq every 14 days   *If you need a refill on your cardiac medications before your next appointment, please call your pharmacy*   Lab Work IN 6 WEEKS : LIPID PANEL  CMP  Follow-Up: At [facility], you and your health needs are our priority.  As part of our continuing mission to provide you with exceptional heart care, we have created designated Provider Care Teams.  These Care Teams include your primary Cardiologist (physician) and Advanced Practice Providers (APPs -  Physician Assistants and Nurse Practitioners) who all work together to provide you with the care you need, when you need it.  Your next appointment:   6 month(s)  Provider:   Madonna Large, DO          1st Floor: - Lobby - Registration  - Pharmacy  - Lab - Cafe  2nd Floor: - PV Lab - Diagnostic Testing (echo, CT, nuclear med)  3rd Floor: - Vacant  4th Floor: - TCTS (cardiothoracic surgery) - AFib Clinic - Structural Heart Clinic - Vascular Surgery  - Vascular Ultrasound  5th Floor: - [facility] (general and EP) - Clinical Pharmacy for coumadin, hypertension, lipid, weight-loss medications, and med management appointments    Valet parking services will be available as well.

## 2023-05-14 NOTE — Progress Notes (Signed)
 Cardiology Office Note:  .   Date:  05/14/2023  ID:  Brandon Hogan, DOB 1971-01-12, MRN [id number] PCP:  Corlis Pagan, NP  Former Cardiology Providers: Emmalene Sor, NP  Sleep medicine provider: Dr. Chalice [facility] Providers Cardiologist:  Madonna Large, DO , [facility] (established care 05/20/2021) Electrophysiologist:  None  Click to update primary MD,subspecialty MD or APP then REFRESH:1}    Chief Complaint  Patient presents with   Follow-up   Atherosclerosis of native coronary artery of native heart w    History of Present Illness: .   Brandon Hogan is a 53 y.o. Caucasian male whose past medical history and cardiovascular risk factors includes: CAD status post PCI to the RCA, mild CAC (32.2AU, 81st percentile), heart failure with improved EF, hypertension, hyperlipidemia, family history of premature CAD, type 2 diabetes, former smoker, history of excessive alcohol use.   Patient ruled in for NSTEMI in April 2024 underwent coronary intervention to the RCA.  Since then we have been working on improving his modifiable cardiovascular risk factors.  Since then he has establish care with sleep medicine with Dr. Chalice and is using his CPAP on a regular basis.  His GDMT has been uptitrated and the most recent echocardiogram noted improvement in LVEF.  Given his NSTEMI and coronary interventions and hypertriglyceridemia patient was recommended to be on Vascepa  for better triglyceride management (as the numbers were not controlled despite being on fenofibrate ).  However at times has been having difficulty getting it filled by his insurance company.  Clinically denies anginal chest pain or heart failure symptoms.  Overall functional capacity remains relatively stable.  Outside labs from December 2024 reviewed via [facility].  Family history of premature CAD with both father and brother having MI in their 71s and they both went bypass surgery. Mom died at the age of 38 due  to myocardial infarction. About 10 years ago he used to drink 12-14 bottles of beer on a daily basis for 4-5 years. Currently works as an Scientist, Product/process Development at [employer].   Review of Systems: .   Review of Systems  Cardiovascular:  Negative for chest pain, claudication, irregular heartbeat, leg swelling, near-syncope, orthopnea, palpitations, paroxysmal nocturnal dyspnea and syncope.  Respiratory:  Negative for shortness of breath.   Hematologic/Lymphatic: Negative for bleeding problem.  Musculoskeletal:  Positive for joint pain.    Studies Reviewed:   EKG: EKG Interpretation Date/Time:  Friday May 14 2023 08:46:45 EST Ventricular Rate:  71 PR Interval:  178 QRS Duration:  86 QT Interval:  394 QTC Calculation: 428 R Axis:   36  Text Interpretation: Normal sinus rhythm Normal ECG When compared with ECG of 21-Jul-2022 05:09, No significant change was found Confirmed by Large Madonna [phone]) on 05/14/2023 8:54:02 AM  Echocardiogram: 05/22/2021: Mildly depressed LV systolic function with visual EF 45-50%.  See report for additional details   08/10/2022: Normal LV systolic function with visual EF 55-60%. Left ventricle cavity is normal in size. Normal left ventricular wall thickness. Normal global wall motion. Normal diastolic filling pattern, normal LAP. Calculated EF 56%. Structurally normal tricuspid valve with trace regurgitation. No evidence of pulmonary hypertension. Compared to 05/2021, EF has improved from 45-50% to 55-60%.   CCTA 06/18/2021: 1. Total coronary calcium  score of 32.3. This was 81st percentile for age and sex matched control.   2. Normal coronary origin with right dominance.   3. CAD-RADS = 2 Mild non-obstructive CAD. Left main: Patent. LAD: Minimal (0-24%) mixed plaque  within the proximal / mid segment. LCx: Mild stenosis (25-49%) due to noncalcified plaque at mid LCX but accuracy is limited due to artifact. RCA: Mild stenosis (25-49%) due to mixed  plaque proximal to mid RCA. 4. Study is sent for CT-FFR to further evaluate the LCX. Findings will be performed and reported separately. 5. No significant incidental findings.   CT-FFR Impression:  1. CT FFR analysis showed no significant stenosis in Left main, LAD, and RCA.  2. Possible hemodynamically significant stenosis within the mid-distal LCx, CT-FFR valves are within the indeterminate zone, clinical correlation required.    Recommendations: Goal directed and uptitration of anti-anginal therapy.  Aggressive risk factor modification for secondary prevention of coronary artery disease.  If symptoms persist despite uptitration of medical therapy consider invasive angiography to further evaluate the LCX.    Heart Catheterization: 07/20/2022 LM: Normal LAD: No significant disease          Diag 2 small to medium caliber. Focal mid 70% stenosis Lcx: Mid long 70% stenosis, RFR 0.94 (physiologically non-significant) RCA: Prox ulcerated 80% stenosis          Mid focal 50% stenosis   Successful OCT guided percutaneous coronary intervention prox RCA     Direct stent placement 4.0 X 16 mm Synergy drug-eluting stent     Post dilatation with 4.5X12 mm [place] balloon ay 16 atm   OCT MLA 3 mm2-->MSA 11 mm2   Risk Assessment/Calculations:   NA   Labs:       Latest Ref Rng & Units 07/21/2022    2:13 AM 07/20/2022    6:44 PM 07/19/2022    7:57 PM  CBC  WBC 4.0 - 10.5 K/uL 7.7  9.2  12.1   Hemoglobin 13.0 - 17.0 g/dL 86.8  85.0  85.1   Hematocrit 39.0 - 52.0 % 38.0  42.9  43.3   Platelets 150 - 400 K/uL 204  232  259        Latest Ref Rng & Units 07/27/2022    4:39 PM 07/21/2022    2:13 AM 07/20/2022    7:51 AM  BMP  Glucose 70 - 99 mg/dL 873  861  827   BUN 6 - 24 mg/dL 27  13  26    Creatinine 0.76 - 1.27 mg/dL 8.75  8.84  8.78   BUN/Creat Ratio 9 - 20 22     Sodium 134 - 144 mmol/L 138  140  138   Potassium 3.5 - 5.2 mmol/L 4.1  3.0  3.0   Chloride 96 - 106 mmol/L 100  102  104    CO2 20 - 29 mmol/L 18  25  24    Calcium  8.7 - 10.2 mg/dL 89.1  9.0  8.7       Latest Ref Rng & Units 07/27/2022    4:39 PM 07/21/2022    2:13 AM 07/20/2022    7:51 AM  CMP  Glucose 70 - 99 mg/dL 873  861  827   BUN 6 - 24 mg/dL 27  13  26    Creatinine 0.76 - 1.27 mg/dL 8.75  8.84  8.78   Sodium 134 - 144 mmol/L 138  140  138   Potassium 3.5 - 5.2 mmol/L 4.1  3.0  3.0   Chloride 96 - 106 mmol/L 100  102  104   CO2 20 - 29 mmol/L 18  25  24    Calcium  8.7 - 10.2 mg/dL 89.1  9.0  8.7     Lab  Results  Component Value Date   CHOL 169 07/20/2022   HDL 30 (L) 07/20/2022   LDLCALC 65 07/20/2022   LDLDIRECT 88 07/01/2021   TRIG 371 (H) 07/20/2022   CHOLHDL 5.6 07/20/2022   Recent Labs    07/21/22 0213  LIPOA <8.4   No components found for: NTPROBNP No results for input(s): PROBNP in the last 8760 hours. No results for input(s): TSH in the last 8760 hours.   06/23/2021: Vitamin B12 562   253 046 9942  Hemoglobin A1C   2021-06-16    Estimated Average Glucose 123      Hemoglobin A1C 5.9   <5.7  Lipid Panel   2021-06-16    Cholesterol 177   <200  Cholesterol / HDL Ratio 5.36   0.00-4.99  HDL Cholesterol 33   >39  LDL Cholesterol (Calculation) SEE COMMENT   <130  LDL/HDL Ratio SEE COMMENT   <3.3  Non-HDL Cholesterol 144   <130  Triglycerides 448   <150    External Labs: Collected: 03/18/2021 A1c 6.8  Total cholesterol 182, triglycerides 233, HDL 32, non-HDL 150, LDL 103. Hemoglobin 14.7, hematocrit 43.3% Sodium 140, potassium 3.7, chloride 103, bicarb 27, BUN 9, creatinine 1.4. AST 32, ALT 59, alkaline phosphatase 113. eGFR 59   Collected 06/22/2022 at PCP         Hemoglobin A1C 7.7 <5.7 % The following HbA1c ranges recommended by the American Diabetes Association  (ADA) may be used as an aid in the diagnosis of diabetes mellitus.  HA1c Suggested Diagnosis  >=6.5% Diabetic  5.7% - 6.4% Pre-Diabetic  <5.7% Non-Diabetic   Estimated Average Glucose 174   Average  Glucose is calculated using the equation AG = (28.7 x HgbA1c) - 46.7  based on the guidelines established by the ADA.   Lipid Panel  Cholesterol 147 <200 mg/dL    Triglycerides 655 <849 mg/dL    HDL Cholesterol 32 >60 mg/dL    Cholesterol / HDL Ratio 4.59 0.00-4.99 Ratio    Non-HDL Cholesterol 115 <130 mg/dL    LDL Cholesterol (Calculation) 46 <130 mg/dL LDL Cholesterol Levels*  Less than 100 mg/dL Optimal  899 to 870 mg/dL Near Optimal/ Above Optimal  130 to 159 mg/dL Borderline High  839 to 189 mg/dL High  809 mg/dL and above Very High  * Categories as recommended by the 2004 ATPIII guidelines   LDL/HDL Ratio 1.4 <3.3 Ratio _________________________________________________________________________  LDL Cholesterol Patient History  _________________________________________________________________________  Test Date: 12/16/2021  LDL Results: 33  Units: mg/dL  % Change: -  -------------------------------------------------------------------------  Test Date: 03/19/2022  LDL Results: 51  Units: mg/dL  % Change: +45%  -------------------------------------------------------------------------  Test Date: 06/18/2022  LDL Results: 46  Units: mg/dL  % Change: -9%  _________________________________________________________________________   Sodium 143 136-145 mEq/L    Potassium 3.5 3.5-5.1 mEq/L    Chloride 102 98-107 mEq/L    CO2 27 21-32 mmol/L    Glucose 195 74-106 mg/dL    BUN 10 2-81 mg/dL    Creatinine 8.92 9.29-8.69 mg/dL    Calcium  9.9 8.5-10.1 mg/dL    Protein 7.7 3.5-1.7 g/dL    Albumin 4.4 6.5-4.9 g/dL    Alkaline Phosphatase 158 25-150 IU/L    ALT (SGPT) 108 <6-78 IU/L    AST (SGOT) 48 0-40 IU/L    Bilirubin, Total 0.9 0.2-1.0 mg/dL    Globulin, Calculated 3.3 1.5-4.6 g/dL    Albumin/Globulin Ratio 1.3 1.1-2.5 mg/dL    BUN/Creatinine Ratio 9.3 11.0-26.0 Ratio    GFR/Black 93 >  59 mL/min/1.3m2    GFR/White 80 >59 mL/min/1.49m2 GFR Categories in Chronic Kidney Disease  (CKD)  GFR Category GFR (mL/min/1.73 sq. meters) Interpretation  G1 90 or greater Normal or high*  G2 60-89 Mild decrease*  G3a 45-59 Mild to moderate decrease  G3b 30-44 Moderate to severe decrease  G4 15-29 Severe decrease  G5 14 or less Kidney failure  *In the absence of kidney damage, neither GFR category G1 or G2 fulfill  the criteria for CKD (Kidney Int Suppl 2013; 3.1-150)  The CKD-EPI calculation is intended for use in patients 76 years of age and  older. Decreased calculation accuracy may be seen in patients taking  medications that affect renal excretion, or in those patients with extremes in  muscle mass or diet.    External Labs: Collected: September 23, 2022 in [facility]. Total cholesterol 164, triglycerides 358, HDL 28, LDL calculated 78. Hemoglobin A1c 6.3 BUN 26, creatinine 1.58. eGFR 53. Sodium 140, potassium 4.5, chloride 103, bicarb 22. AST 26, ALT 35, alkaline phosphatase 70 Hemoglobin 14.8, hematocrit 46.5  External Labs: Collected: 03/30/2023 [facility] database. BUN26, Cr 1.33 AST, ALT, ALk Phos within normal limits. Total cholesterol 185, triglycerides 345, HDL 32, LDL calculated 95 H&H 15.7/48.8%  Physical Exam:    Today's Vitals   05/14/23 0843  BP: 110/68  Pulse: 75  Resp: 16  SpO2: 94%  Weight: 232 lb (105.2 kg)  Height: 6' 2 (1.88 m)   Body mass index is 29.79 kg/m. Wt Readings from Last 3 Encounters:  05/14/23 232 lb (105.2 kg)  11/19/22 223 lb (101.2 kg)  08/18/22 225 lb 6.4 oz (102.2 kg)    Physical Exam  Constitutional: No distress.  Age appropriate, hemodynamically stable.   Neck: No JVD present.  Cardiovascular: Normal rate, regular rhythm, S1 normal, S2 normal, intact distal pulses and normal pulses. Exam reveals no gallop, no S3 and no S4.  No murmur heard. Pulses:      Dorsalis pedis pulses are 2+ on the right side and 2+ on the left side.       Posterior tibial pulses are 2+ on the right side and 2+ on the left side.   Pulmonary/Chest: Effort normal and breath sounds normal. No stridor. He has no wheezes. He has no rales.  Abdominal: Soft. Bowel sounds are normal. He exhibits no distension. There is no abdominal tenderness.  Musculoskeletal:        General: No edema.     Cervical back: Neck supple.  Skin: Skin is warm and moist.     Impression & Recommendation(s):  Impression:   ICD-10-CM   1. Atherosclerosis of native coronary artery of native heart without angina pectoris  I25.10 EKG 12-Lead    2. Status post coronary artery stent placement  Z95.5     3. Agatston coronary artery calcium  score less than 100  R93.1     4. Heart failure with improved ejection fraction (HFimpEF) (HCC)  I50.32     5. Benign hypertension  I10     6. Controlled type 2 diabetes mellitus without complication, without long-term current use of insulin  (HCC)  E11.9     7. Mixed hyperlipidemia  E78.2 Lipid panel    Comprehensive metabolic panel    AMB Referral to [facility] Pharm-D    8. Hypertriglyceridemia  E78.1 Lipid panel    Comprehensive metabolic panel    AMB Referral to [facility]-D       Recommendation(s):  Atherosclerosis of native coronary artery of native heart  without angina pectoris Status post coronary artery stent placement Agatston coronary artery calcium  score less than 100 Since last office visit denies anginal chest pain. Continue dual antiplatelet therapy for 1 year in the setting of ACS, until July 20, 2023 EKG independently reviewed-nonischemic Coronary CTA: Total CAC 32.3, 81st percentile, see report for additional details Lipids still need to be optimized. Plan was to initiate PCSK9 inhibitor-will refer to Pharm.D. Recommend a goal LDL <55 mg/dL. Recommend a goal triglyceride levels <149 mg/dL Outside labs from December 2024 independently reviewed via [facility] database  Heart failure with improved ejection fraction (HFimpEF) (HCC) Echo February 2023: LVEF of 45-50% Echo May  2024: LVEF 55-60% Stage B, NYHA class I/II Significant reduction in alcohol use Continue Farxiga  10 mg p.o. daily. Continue Toprol -XL 100 mg p.o. twice daily. Continue Entresto  97/103 mg p.o. twice daily. Continue spironolactone  25 mg p.o. every morning Reemphasized importance of using his CPAP on a regular basis.  Benign hypertension Office blood pressures are very well-controlled. Continue current medical therapy  Controlled type 2 diabetes mellitus without complication, without long-term current use of insulin  (HCC) Mixed hyperlipidemia Hypertriglyceridemia Emphasize importance of glycemic control. Continue atorvastatin  80 mg p.o. daily. Continue fenofibrate  145 mg p.o. daily Has not had much luck with getting Vascepa  filled despite his comorbidities, patient states that the prescription will be covered by his current drug plan. To optimize his lipids and triglycerides recommend initiating PCSK9 inhibitor given his coronary artery disease, diabetes, and coronary interventions.  PCSK9 inhibitor should further improve his LDL levels as well as triglycerides to a certain degree.  Will need to continue to monitor.  I have advised the patient to arrange his colonoscopy as originally planned.  He can come off of dual antiplatelet therapy after July 20, 2023 to have the elective colonoscopy performed.  Once the colonoscopy is completed would recommend restarting antiplatelet therapy.  He can call us  for further guidance if needed as he gets closer to his colonoscopy.  Orders Placed:  Orders Placed This Encounter  Procedures   Lipid panel    Standing Status:   Future    Expected Date:   06/25/2023    Expiration Date:   05/13/2024   Comprehensive metabolic panel    Standing Status:   Future    Expected Date:   06/25/2023    Expiration Date:   05/13/2024   AMB Referral to [facility] Pharm-D    Referral Priority:   Routine    Referral Type:   Consultation    Referral Reason:   Specialty  Services Required    Number of Visits Requested:   1   EKG 12-Lead    Final Medication List:   No orders of the defined types were placed in this encounter.   Medications Discontinued During This Encounter  Medication Reason   VASCEPA  1 g capsule Patient Preference     Current Outpatient Medications:    allopurinol  (ZYLOPRIM ) 300 MG tablet, Take 300 mg by mouth daily., Disp: , Rfl:    aspirin  EC 81 MG tablet, Take 81 mg by mouth daily. Swallow whole., Disp: , Rfl:    atorvastatin  (LIPITOR) 80 MG tablet, TAKE 1 TABLET(80 MG) BY MOUTH DAILY, Disp: 90 tablet, Rfl: 1   FARXIGA  10 MG TABS tablet, TAKE 1 TABLET(10 MG) BY MOUTH DAILY BEFORE BREAKFAST (Patient taking differently: Take 10 mg by mouth daily.), Disp: 90 tablet, Rfl: 0   fenofibrate  (TRICOR ) 145 MG tablet, TAKE 1 TABLET(145 MG) BY MOUTH DAILY, Disp: 90 tablet,  Rfl: 2   metFORMIN  (GLUCOPHAGE -XR) 500 MG 24 hr tablet, Take 2 tablets (1,000 mg total) by mouth 2 (two) times daily with a meal., Disp: , Rfl: 4   metoprolol  succinate (TOPROL -XL) 100 MG 24 hr tablet, TAKE 1 TABLET(100 MG) BY MOUTH TWICE DAILY WITH OR IMMEDIATELY FOLLOWING A MEAL, Disp: 90 tablet, Rfl: 3   Multiple Vitamin (MULTIVITAMIN WITH MINERALS) TABS tablet, Take 1 tablet by mouth daily., Disp: , Rfl:    sacubitril -valsartan  (ENTRESTO ) 97-103 MG, Take 1 tablet by mouth 2 (two) times daily., Disp: 180 tablet, Rfl: 3   spironolactone  (ALDACTONE ) 25 MG tablet, TAKE 1 TABLET BY MOUTH EVERY MORNING, Disp: 90 tablet, Rfl: 2   ticagrelor  (BRILINTA ) 90 MG TABS tablet, Take 1 tablet (90 mg total) by mouth 2 (two) times daily., Disp: 180 tablet, Rfl: 3   venlafaxine XR (EFFEXOR-XR) 75 MG 24 hr capsule, Take 75 mg by mouth daily with breakfast., Disp: , Rfl:   Consent:   NA  Disposition:   6 months sooner if needed  His questions and concerns were addressed to his satisfaction. He voices understanding of the recommendations provided during this encounter.    Signed, Madonna Large, DO, [facility]  [facility]  [address] [place], [place] [address] 05/14/2023 2:35 PM

## 2023-05-24 ENCOUNTER — Ambulatory Visit: Payer: Self-pay | Admitting: Cardiology

## 2023-06-28 ENCOUNTER — Telehealth: Payer: Self-pay | Admitting: Pharmacy Technician

## 2023-06-28 ENCOUNTER — Encounter: Payer: Self-pay | Admitting: Pharmacist Clinician (PhC)/ Clinical Pharmacy Specialist

## 2023-06-28 ENCOUNTER — Telehealth: Payer: Self-pay | Admitting: Pharmacist Clinician (PhC)/ Clinical Pharmacy Specialist

## 2023-06-28 ENCOUNTER — Other Ambulatory Visit (HOSPITAL_COMMUNITY): Payer: Self-pay

## 2023-06-28 ENCOUNTER — Ambulatory Visit: Attending: Cardiology | Admitting: Pharmacist Clinician (PhC)/ Clinical Pharmacy Specialist

## 2023-06-28 DIAGNOSIS — E785 Hyperlipidemia, unspecified: Secondary | ICD-10-CM | POA: Diagnosis not present

## 2023-06-28 NOTE — Assessment & Plan Note (Signed)
 Assessment: Patient with ASCVD not at LDL goal of < 55 Most recent LDL 95 on 03/2023 Has been compliant with high intensity statin : atorvastatin 80 mg Not able to tolerate statins secondary to myalgias Reviewed options for lowering LDL cholesterol, including ezetimibe, PCSK-9 inhibitors, bempedoic acid and inclisiran.  Discussed mechanisms of action, dosing, side effects, potential decreases in LDL cholesterol and costs.  Also reviewed potential options for patient assistance.  Plan: Patient agreeable to starting Repatha 140 mg q14d Repeat labs after:  3 months Lipid Liver function Patient was given information on [employer] copay card Has been unable to get omega-3 medication coverage in the past.  Reviewed food options for lowering triglycerides.

## 2023-06-28 NOTE — Telephone Encounter (Signed)
 Ran test claim for repatha. For a 28 day supply and the co-pay is 24.99 . PA is not needed at this time. Nothing saying this is a transition fill. This test claim was processed through [facility]- copay amounts may vary at other pharmacies due to pharmacy/plan contracts, or as the patient moves through the different stages of their insurance plan.

## 2023-06-28 NOTE — Progress Notes (Signed)
 Office Visit    Patient Name: Brandon Hogan Date of Encounter: 06/28/2023  Primary Care Provider:  [name], NP Primary Cardiologist:  [name], DO  Chief Complaint    Hyperlipidemia   Significant Past Medical History   CAD 4/24NSTEMI w/PCI to RCA CAC 32.2 (81st percentile)  HF EF improved to, mLCx with 70% stenosis; on GDMT  HTN Controlled on GDMT  DM2 3/24 A1c 7, now down to 6.3; on dapagliflozin, metformin  OSA Now on CPAP, followed by Dr. Vickey Huger     No Known Allergies  History of Present Illness    Brandon Hogan is a 53 y.o. male patient of Dr [name], in the office today to discuss options for cholesterol management.  He currently takes atorvastatin 80 mg without any issues, however LDL still above goal.  His triglycerides also tend to be elevated, he currently takes fenofibrate.  He also notes that MD's have tried to get Vascepa for him in the past, and either it was not covered or the copay was beyond his ability to cover.    Insurance Carrier:  Medical laboratory scientific officer:   Building services engineer and [employer]  Healthwell:  no  LDL Cholesterol goal:  LDL < 55, TG < 150  Current Medications:   atorvastatin 80 mg qd  Family Hx: both father and brother having MI in their 15s and they both went bypass surgery. Mom died at the age of 79 due to myocardial infarction.  Daughter (14)    Social Hx: Tobacco:  quit years ago (7-8) Alcohol: occasional social drink -    Diet:  eats mix with more eating out now.  Trying to adjust to more home cooking (separating); eating variety of protein, no fish; regular vegetables - salads, green beans; does snack, but trying to cut back  Exercise: walks regularly with work ([place]), nothing outside of work, gets winded with 2 much walking (sciatica)  Adherence Assessment  Do you ever forget to take your medication? [] Yes [x] No  Do you ever skip doses due to side effects? [] Yes [x] No  Do you have trouble affording  your medicines? [] Yes [x] No  Are you ever unable to pick up your medication due to transportation difficulties? [] Yes [x] No   Adherence strategy: 7 day bid minder  Accessory Clinical Findings   03/2023 (in [facility])  TC 185, TG 349, HDL 32, LDL 95  Lab Results  Component Value Date   CHOL 169 07/20/2022   HDL 30 (L) 07/20/2022   LDLCALC 65 07/20/2022   LDLDIRECT 88 07/01/2021   TRIG 371 (H) 07/20/2022   CHOLHDL 5.6 07/20/2022    Lipoprotein (a)  Date/Time Value Ref Range Status  07/21/2022 02:13 AM <8.4 <75.0 nmol/L Final    Comment:    (NOTE) **Results verified by repeat testing** Note:  Values greater than or equal to 75.0 nmol/L may       indicate an independent risk factor for CHD,       but must be evaluated with caution when applied       to non-Caucasian populations due to the       influence of genetic factors on Lp(a) across       ethnicities. Performed At: [facility] [address] [place], [place] [id number] Jolene Schimke MD NG:[id number]     Lab Results  Component Value Date   ALT 167 (H) 09/20/2016   AST 52 (H) 09/20/2016   ALKPHOS 150 (H) 09/20/2016   BILITOT 1.4 (H)  09/20/2016   Lab Results  Component Value Date   CREATININE 1.24 07/27/2022   BUN 27 (H) 07/27/2022   NA 138 07/27/2022   K 4.1 07/27/2022   CL 100 07/27/2022   CO2 18 (L) 07/27/2022   Lab Results  Component Value Date   HGBA1C 5.2 09/19/2016    Home Medications    Current Outpatient Medications  Medication Sig Dispense Refill   allopurinol (ZYLOPRIM) 300 MG tablet Take 300 mg by mouth daily.     aspirin EC 81 MG tablet Take 81 mg by mouth daily. Swallow whole.     atorvastatin (LIPITOR) 80 MG tablet TAKE 1 TABLET(80 MG) BY MOUTH DAILY 90 tablet 1   FARXIGA 10 MG TABS tablet TAKE 1 TABLET(10 MG) BY MOUTH DAILY BEFORE BREAKFAST (Patient taking differently: Take 10 mg by mouth daily.) 90 tablet 0   fenofibrate (TRICOR) 145 MG tablet TAKE 1 TABLET(145 MG) BY MOUTH DAILY  90 tablet 2   metFORMIN (GLUCOPHAGE-XR) 500 MG 24 hr tablet Take 2 tablets (1,000 mg total) by mouth 2 (two) times daily with a meal.  4   metoprolol succinate (TOPROL-XL) 100 MG 24 hr tablet TAKE 1 TABLET(100 MG) BY MOUTH TWICE DAILY WITH OR IMMEDIATELY FOLLOWING A MEAL 90 tablet 3   Multiple Vitamin (MULTIVITAMIN WITH MINERALS) TABS tablet Take 1 tablet by mouth daily.     sacubitril-valsartan (ENTRESTO) 97-103 MG Take 1 tablet by mouth 2 (two) times daily. 180 tablet 3   spironolactone (ALDACTONE) 25 MG tablet TAKE 1 TABLET BY MOUTH EVERY MORNING 90 tablet 2   ticagrelor (BRILINTA) 90 MG TABS tablet Take 1 tablet (90 mg total) by mouth 2 (two) times daily. 180 tablet 3   venlafaxine XR (EFFEXOR-XR) 75 MG 24 hr capsule Take 75 mg by mouth daily with breakfast.     No current facility-administered medications for this visit.     Assessment & Plan    Dyslipidemia Assessment: Patient with ASCVD not at LDL goal of < 55 Most recent LDL 95 on 03/2023 Has been compliant with high intensity statin : atorvastatin 80 mg Not able to tolerate statins secondary to myalgias Reviewed options for lowering LDL cholesterol, including ezetimibe, PCSK-9 inhibitors, bempedoic acid and inclisiran.  Discussed mechanisms of action, dosing, side effects, potential decreases in LDL cholesterol and costs.  Also reviewed potential options for patient assistance.  Plan: Patient agreeable to starting Repatha 140 mg q14d Repeat labs after:  3 months Lipid Liver function Patient was given information on [employer] copay card Has been unable to get omega-3 medication coverage in the past.  Reviewed food options for lowering triglycerides.     Phillips Hay, PharmD CPP [facility] [address]  [place], [place] [address] [phone]  06/28/2023, 1:40 PM

## 2023-06-28 NOTE — Telephone Encounter (Signed)
 Please do PA for Repatha

## 2023-06-28 NOTE — Patient Instructions (Signed)
 Your Results:             Your most recent labs Goal  Total Cholesterol 169 < 200  Triglycerides 349 < 150  HDL (happy/good cholesterol) 32 > 40  LDL (lousy/bad cholesterol 95 < 55   Medication changes:  We will start the process to get Repatha covered by your insurance.  Once the prior authorization is complete, I will call/send a MyChart message to let you know and confirm pharmacy information.   You will take one injection every 14 days  Lab orders:  We want to repeat labs after 2-3 months.  We will send you a lab order to remind you once we get closer to that time.    Patient Assistance:  Repatha Co-pay Card Instructions 1.      Go to [email or web address]  2.      Click the red "Repahta Co-Pay Card" button near the top right 3.      Scroll down and click "Yes, I have a Repatha prescription 4.      Continue to scroll down and selected "[employer]"  5.      Continue to scroll down and for the question "Are you eligible for Medicare but receiving prescription drug coverage from a former employer, union, or welfare plan?" select "No" 6.      Continue to scroll down and click the first box which is next to "Repatha Co-Pay Card, and beneath that, select "I want to enroll in the Repatha Co-Pay Card Program" 7.      Continue to scroll down until you see the "Next" button, then click it 8.      Fill out your personal information then click the "Next" button    Thank you for choosing [facility]    High Triglycerides Eating Plan Triglycerides are a type of fat in the blood. High levels of triglycerides can increase your risk of heart disease and stroke. If your triglyceride levels are high, choosing the right foods can help lower your triglycerides and keep your heart healthy. Work with your health care provider or a dietitian to develop an eating plan that is right for you. What are tips for following this plan? General guidelines  Lose weight, if you are overweight. For  most people, losing 5-10 lb (2-5 kg) helps lower triglyceride levels. A weight-loss plan may include: 30 minutes of exercise at least 5 days a week. Reducing the amount of calories, sugar, and fat you eat. Eat a wide variety of fresh fruits, vegetables, and whole grains. These foods are high in fiber. Eat foods that contain healthy fats, such as fatty fish, nuts, seeds, and olive oil. Avoid foods that are high in added sugar, added salt (sodium), and saturated fat. Avoid low-fiber, refined carbohydrates such as white bread, crackers, noodles, and white rice. Avoid foods with trans fats or partially hydrogenated oils, such as fried foods or stick margarine. If you drink alcohol: Limit how much you have to: 0-1 drink a day for women who are not pregnant. 0-2 drinks a day for men. Your health care provider may recommend that you drink less than these amounts depending on your overall health. Know how much alcohol is in a drink. In the [place] one drink equals one 12 oz bottle of beer (355 mL), one 5 oz glass of wine (148 mL), or one 1 oz glass of hard liquor (44 mL). Reading food labels Check food labels for: The amount of saturated fat. Choose foods with  no or very little saturated fat (less than 2 g). The amount of trans fat. Choose foods with no transfat. The amount of cholesterol. Choose foods that are low in cholesterol. The amount of sodium. Choose foods with less than 140 milligrams (mg) per serving. Shopping Buy dairy products labeled as nonfat (skim) or low-fat (1%). Avoid buying processed or prepackaged foods. These are often high in added sugar, sodium, and fat. Cooking Choose healthy fats when cooking, such as olive oil, avocado oil, or canola oil. Cook foods using lower fat methods, such as baking, broiling, boiling, or grilling. Make your own sauces, dressings, and marinades when possible, instead of buying them. Store-bought sauces, dressings, and marinades are often high in  sodium and sugar. Meal planning Eat more home-cooked food and less restaurant, buffet, and fast food. Eat fatty fish at least 2 times each week. Examples of fatty fish include salmon, trout, sardines, mackerel, tuna, and herring. If you eat whole eggs, do not eat more than 4 egg yolks per week . What foods should I eat?  Fruits All fresh, canned (in natural juice), or frozen fruits. Vegetables Fresh or frozen vegetables. Low-sodium canned vegetables. Grains Whole wheat or whole grain breads, crackers, cereals, and pasta. Unsweetened oatmeal. Bulgur. Barley. Quinoa. Brown rice. Whole wheat flour tortillas. Meats and other proteins Skinless chicken or [place]. Ground chicken or [place]. Lean cuts of pork, trimmed of fat. Fish and seafood, especially salmon, trout, and herring. Egg whites. Dried beans, peas, or lentils. Unsalted nuts or seeds. Unsalted canned beans. Natural peanut or almond butter or other nut butters. Dairy Low-fat dairy products. Skim or low-fat (1%) milk. Reduced fat (2%) and low-sodium cheese. Low-fat ricotta cheese. Low-fat cottage cheese. Plain, low-fat yogurt. Fats and oils Tub margarine without trans fats. Light or reduced-fat mayonnaise. Light or reduced-fat salad dressings. Avocado. Safflower, olive, sunflower, soybean, and canola oils. The items listed above may not be a complete list of recommended foods and beverages. Talk with your dietitian about what dietary choices are best for you.  What foods should I avoid?  Fruits Sweetened dried fruit. Canned fruit in syrup. Fruit juice. Vegetables Creamed or fried vegetables. Vegetables in a cheese sauce. Grains White bread. White (regular) pasta. White rice. Cornbread. Bagels. Pastries. Crackers that contain trans fat. Meats and other proteins Fatty cuts of meat. Ribs. Chicken wings. Tomasa Blase. Sausage. Bologna. Salami. Chitterlings. Fatback. Hot dogs. Bratwurst. Packaged lunch meats. Dairy Whole or reduced-fat (2%)  milk. Half-and-half. Cream cheese. Full-fat or sweetened yogurt. Full-fat cheese. Nondairy creamers. Whipped toppings. Processed cheese or cheese spreads. Cheese curds. Fats and oils Butter. Stick margarine. Lard. Shortening. Ghee. Bacon fat. Tropical oils, such as coconut, palm kernel, or palm oils. Beverages Alcohol. Sweetened drinks, such as soda, lemonade, fruit drinks, or punches. Sweets and desserts Corn syrup. Sugars. Honey. Molasses. Candy. Jam and jelly. Syrup. Sweetened cereals. Cookies. Pies. Cakes. Donuts. Muffins. Ice cream. Condiments Store-bought sauces, dressings, and marinades that are high in sugar, such as ketchup and barbecue sauce. The items listed above may not be a complete list of foods and beverages you should avoid. Talk with your dietitian about what dietary choices are best for you. Summary High levels of triglycerides can increase the risk of heart disease and stroke. Choosing the right foods can help lower your triglycerides. Eat plenty of fresh fruits, vegetables, and whole grains. Choose low-fat dairy and lean meats. Eat fatty fish at least twice a week. Avoid processed and prepackaged foods with added sugar, sodium, saturated fat, and  trans fat. If you need suggestions or have questions about what types of food are good for you, talk with your health care provider or a dietitian. This information is not intended to replace advice given to you by your health care provider. Make sure you discuss any questions you have with your health care provider. Document Revised: 08/02/2020 Document Reviewed: 08/02/2020 Elsevier Patient Education  2024 [employer].

## 2023-06-29 ENCOUNTER — Other Ambulatory Visit: Payer: Self-pay | Admitting: *Deleted

## 2023-06-29 ENCOUNTER — Telehealth: Payer: Self-pay | Admitting: *Deleted

## 2023-06-29 DIAGNOSIS — Z122 Encounter for screening for malignant neoplasm of respiratory organs: Secondary | ICD-10-CM

## 2023-06-29 DIAGNOSIS — Z87891 Personal history of nicotine dependence: Secondary | ICD-10-CM

## 2023-06-29 MED ORDER — REPATHA SURECLICK 140 MG/ML ~~LOC~~ SOAJ: 140.0000 mg | SUBCUTANEOUS | 3 refills | Status: AC

## 2023-06-29 NOTE — Telephone Encounter (Signed)
 Lung Cancer Screening Narrative/Criteria Questionnaire (Cigarette Smokers Only- No Cigars/Pipes/vapes)   Brandon Hogan   SDMV:07/28/23 9:00- [name]                                           May 13, 1970              LDCT: 07/29/23 9:40- GI    52 y.o.   Phone: [phone]  Lung Screening Narrative (confirm age 67-77 yrs Medicare / 50-80 yrs Private pay insurance)   Insurance information:BCBS   Referring Provider:Tate   This screening involves an initial phone call with a team member from our program. It is called a shared decision making visit. The initial meeting is required by insurance and Medicare to make sure you understand the program. This appointment takes about 15-20 minutes to complete. The CT scan will completed at a separate date/time. This scan takes about 5-10 minutes to complete and you may eat and drink before and after the scan.  Criteria questions for Lung Cancer Screening:   Are you a current or former smoker? Former Age began smoking: 19   If you are a former smoker, what year did you quit smoking? 2018 (within 15 yrs)   To calculate your smoking history, I need an accurate estimate of how many packs of cigarettes you smoked per day and for how many years. (Not just the number of PPD you are now smoking)   Years smoking 26 x Packs per day 1-2 = Pack years 40   (at least 20 pack yrs)   (Make sure they understand that we need to know how much they have smoked in the past, not just the number of PPD they are smoking now)  Do you have a personal history of cancer?  No    Do you have a family history of cancer? Yes  (cancer type and and relative) GM ( colon) Father (kidney)  Are you coughing up blood?  No  Have you had unexplained weight loss of 15 lbs or more in the last 6 months? No  It looks like you meet all criteria.     Additional information: N/A

## 2023-07-06 ENCOUNTER — Ambulatory Visit
Payer: No Typology Code available for payment source | Admitting: Pharmacist Clinician (PhC)/ Clinical Pharmacy Specialist

## 2023-07-06 ENCOUNTER — Other Ambulatory Visit: Payer: Self-pay | Admitting: Cardiology

## 2023-07-28 ENCOUNTER — Encounter: Payer: Self-pay | Admitting: Adult Health

## 2023-07-28 ENCOUNTER — Ambulatory Visit (INDEPENDENT_AMBULATORY_CARE_PROVIDER_SITE_OTHER): Admitting: Adult Health

## 2023-07-28 DIAGNOSIS — Z87891 Personal history of nicotine dependence: Secondary | ICD-10-CM | POA: Diagnosis not present

## 2023-07-28 NOTE — Progress Notes (Signed)
  Virtual Visit via Telephone Note  I connected with Brandon Hogan , 07/28/23 9:12 AM by a telemedicine application and verified that I am speaking with the correct person using two identifiers.  Location: Patient: home Provider: home   I discussed the limitations of evaluation and management by telemedicine and the availability of in person appointments. The patient expressed understanding and agreed to proceed.   Shared Decision Making Visit Lung Cancer Screening Program [phone])   Eligibility: 53 y.o. Pack Years Smoking History Calculation = 40 pack years  (# packs/per year x # years smoked) Recent History of coughing up blood  no Unexplained weight loss? no ( >Than 15 pounds within the last 6 months ) Prior History Lung / other cancer no (Diagnosis within the last 5 years already requiring surveillance chest CT Scans). Smoking Status Former Smoker Former Smokers: Years since quit: 7 years  Quit Date: 2018  Visit Components: Discussion included one or more decision making aids. YES Discussion included risk/benefits of screening. YES Discussion included potential follow up diagnostic testing for abnormal scans. YES Discussion included meaning and risk of over diagnosis. YES Discussion included meaning and risk of False Positives. YES Discussion included meaning of total radiation exposure. YES  Counseling Included: Importance of adherence to annual lung cancer LDCT screening. YES Impact of comorbidities on ability to participate in the program. YES Ability and willingness to under diagnostic treatment. YES  Smoking Cessation Counseling: Former Smokers:  Discussed the importance of maintaining cigarette abstinence. yes Diagnosis Code: Personal History of Nicotine Dependence. X91.478 Information about tobacco cessation classes and interventions provided to patient. Yes Patient provided with "ticket" for LDCT Scan. yes Written Order for Lung Cancer Screening with  LDCT placed in Epic. Yes (CT Chest Lung Cancer Screening Low Dose W/O CM) [id number]  Z12.2-Screening of respiratory organs Z87.891-Personal history of nicotine dependence   [place]ullen Dose 07/28/23

## 2023-07-28 NOTE — Patient Instructions (Signed)

## 2023-07-29 ENCOUNTER — Ambulatory Visit
Admission: RE | Admit: 2023-07-29 | Discharge: 2023-07-29 | Disposition: A | Discharge status: Home / Self Care | Source: Ambulatory Visit | Attending: Acute Care | Admitting: Acute Care

## 2023-07-29 DIAGNOSIS — Z87891 Personal history of nicotine dependence: Secondary | ICD-10-CM

## 2023-07-29 DIAGNOSIS — Z122 Encounter for screening for malignant neoplasm of respiratory organs: Secondary | ICD-10-CM

## 2023-08-07 ENCOUNTER — Encounter: Payer: Self-pay | Admitting: Cardiology

## 2023-08-09 ENCOUNTER — Other Ambulatory Visit: Payer: Self-pay

## 2023-08-09 NOTE — Telephone Encounter (Signed)
 Yes, he as completed DAPT for 1 year.  He can stop Brilinta  and continue ASA 81 mg po qday.  Update his MAR.   Samik Balkcom [place], DO, [facility]

## 2023-08-12 ENCOUNTER — Other Ambulatory Visit: Payer: Self-pay | Admitting: Cardiology

## 2023-08-12 DIAGNOSIS — R931 Abnormal findings on diagnostic imaging of heart and coronary circulation: Secondary | ICD-10-CM

## 2023-08-12 DIAGNOSIS — I25118 Atherosclerotic heart disease of native coronary artery with other forms of angina pectoris: Secondary | ICD-10-CM

## 2023-08-12 DIAGNOSIS — I5042 Chronic combined systolic (congestive) and diastolic (congestive) heart failure: Secondary | ICD-10-CM

## 2023-08-23 ENCOUNTER — Other Ambulatory Visit: Payer: Self-pay | Admitting: Acute Care

## 2023-08-23 DIAGNOSIS — Z122 Encounter for screening for malignant neoplasm of respiratory organs: Secondary | ICD-10-CM

## 2023-08-23 DIAGNOSIS — Z87891 Personal history of nicotine dependence: Secondary | ICD-10-CM

## 2023-09-16 ENCOUNTER — Other Ambulatory Visit: Payer: Self-pay | Admitting: Pharmacist Clinician (PhC)/ Clinical Pharmacy Specialist

## 2023-09-16 ENCOUNTER — Encounter: Payer: Self-pay | Admitting: Pharmacist Clinician (PhC)/ Clinical Pharmacy Specialist

## 2023-09-16 DIAGNOSIS — Z955 Presence of coronary angioplasty implant and graft: Secondary | ICD-10-CM

## 2023-09-16 DIAGNOSIS — E781 Pure hyperglyceridemia: Secondary | ICD-10-CM

## 2023-09-16 DIAGNOSIS — E782 Mixed hyperlipidemia: Secondary | ICD-10-CM

## 2023-09-16 DIAGNOSIS — I2510 Atherosclerotic heart disease of native coronary artery without angina pectoris: Secondary | ICD-10-CM

## 2023-10-10 ENCOUNTER — Other Ambulatory Visit: Payer: Self-pay | Admitting: Cardiology

## 2023-11-11 LAB — LIPID PANEL WITH LDL/HDL RATIO
Cholesterol, Total: 71 mg/dL — ABNORMAL LOW (ref 100–199)
HDL: 30 mg/dL — ABNORMAL LOW (ref >39)
LDL Chol Calc (NIH): 15 mg/dL (ref 0–99)
LDL/HDL Ratio: 0.5 ratio (ref 0.0–3.6)
Triglycerides: 154 mg/dL — ABNORMAL HIGH (ref 0–149)
VLDL Cholesterol Cal: 26 mg/dL (ref 5–40)

## 2023-11-11 LAB — LDL CHOLESTEROL, DIRECT: LDL Direct: 15 mg/dL (ref 0–99)

## 2023-11-12 ENCOUNTER — Ambulatory Visit: Payer: Self-pay | Admitting: Cardiology

## 2023-11-16 ENCOUNTER — Other Ambulatory Visit: Payer: Self-pay | Admitting: Cardiology

## 2023-11-16 DIAGNOSIS — I10 Essential (primary) hypertension: Secondary | ICD-10-CM

## 2023-11-16 DIAGNOSIS — R072 Precordial pain: Secondary | ICD-10-CM

## 2023-11-17 ENCOUNTER — Encounter: Payer: Self-pay | Admitting: Cardiology

## 2023-11-17 ENCOUNTER — Ambulatory Visit: Attending: Cardiology | Admitting: Cardiology

## 2023-11-17 VITALS — BP 124/84 | HR 85 | Resp 16 | Ht 74.0 in | Wt 203.8 lb

## 2023-11-17 DIAGNOSIS — R931 Abnormal findings on diagnostic imaging of heart and coronary circulation: Secondary | ICD-10-CM

## 2023-11-17 DIAGNOSIS — E119 Type 2 diabetes mellitus without complications: Secondary | ICD-10-CM

## 2023-11-17 DIAGNOSIS — Z955 Presence of coronary angioplasty implant and graft: Secondary | ICD-10-CM | POA: Diagnosis not present

## 2023-11-17 DIAGNOSIS — I2510 Atherosclerotic heart disease of native coronary artery without angina pectoris: Secondary | ICD-10-CM | POA: Diagnosis not present

## 2023-11-17 DIAGNOSIS — I5032 Chronic diastolic (congestive) heart failure: Secondary | ICD-10-CM | POA: Diagnosis not present

## 2023-11-17 DIAGNOSIS — E781 Pure hyperglyceridemia: Secondary | ICD-10-CM

## 2023-11-17 DIAGNOSIS — E782 Mixed hyperlipidemia: Secondary | ICD-10-CM

## 2023-11-17 DIAGNOSIS — I10 Essential (primary) hypertension: Secondary | ICD-10-CM

## 2023-11-17 MED ORDER — CLOPIDOGREL BISULFATE 75 MG PO TABS: 75.0000 mg | ORAL_TABLET | Freq: Every day | ORAL | 3 refills | Status: AC

## 2023-11-17 NOTE — Progress Notes (Signed)
 Cardiology Office Note:  .   Date:  11/17/2023  ID:  Brandon Hogan, DOB May 02, 1970, MRN [id number] PCP:  Corlis Pagan, NP  Former Cardiology Providers: Emmalene Sor, NP  Sleep medicine provider: Dr. Chalice [facility] Providers Cardiologist:  Madonna Large, DO , [facility] (established care 05/20/2021) Electrophysiologist:  None  Click to update primary MD,subspecialty MD or APP then REFRESH:1}    Chief Complaint  Patient presents with   Atherosclerosis of native coronary artery of native heart w   Follow-up    History of Present Illness: .   Brandon Hogan is a 53 y.o. Caucasian male whose past medical history and cardiovascular risk factors includes: CAD status post PCI to the RCA, mild CAC (32.2AU, 81st percentile), heart failure with improved EF, hypertension, hyperlipidemia, family history of premature CAD, type 2 diabetes, former smoker, history of excessive alcohol use.   Patient ruled in for NSTEMI in April 2024 underwent coronary intervention to the RCA.  He has followed up with Dr. Chalice and was diagnosed with mild to moderate sleep apnea and CPAP was recommended.  After his GDMT was uptitrated repeat echocardiogram noted improvement in LVEF.  Given his NSTEMI and coronary interventions and hypertriglyceridemia patient was recommended to be on Vascepa  for better triglyceride management (as the numbers were not controlled despite being on fenofibrate ).  However at times has been having difficulty getting it filled by his insurance company.  He was also encouraged to decrease the amount of alcohol consumption as it is contributory to elevated triglyceride levels.  Given his history of CAD and multiple risk factors he was referred to lipid clinic for possible Repatha  initiation.  He is on Repatha  and tolerating it well.  And his LDL levels have significantly improved.  Triglycerides have also improved but still above goal.  Patient presents today for 42-month follow-up  visit.  He denies anginal chest pain or heart failure symptoms.  Unfortunately, he recently lost his dad and has been undergoing a divorce which has been emotionally stressful.  He has noticed approximately 30 pounds of weight loss over the last 6 months which he is attributing to reduce caloric intake.  In addition, he has increased alcohol consumption to approximately 6 beers per day.  Patient endorses that the weight loss is unintentional and he has not followed up with PCP for further evaluation.  His colonoscopy is still pending.  Family history of premature CAD with both father and brother having MI in their 9s and they both went bypass surgery. Mom died at the age of 67 due to myocardial infarction. About 10 years ago he used to drink 12-14 bottles of beer on a daily basis for 4-5 years. Currently works as an Scientist, product/process development at [employer].   Review of Systems: .   Review of Systems  Constitutional: Positive for weight loss.  Cardiovascular:  Negative for chest pain, claudication, irregular heartbeat, leg swelling, near-syncope, orthopnea, palpitations, paroxysmal nocturnal dyspnea and syncope.  Respiratory:  Negative for shortness of breath.   Hematologic/Lymphatic: Negative for bleeding problem.  Musculoskeletal:  Positive for joint pain.  Gastrointestinal:  Positive for nausea.    Studies Reviewed:   Echocardiogram: 05/22/2021: Mildly depressed LV systolic function with visual EF 45-50%.  See report for additional details   08/10/2022: Normal LV systolic function with visual EF 55-60%. Left ventricle cavity is normal in size. Normal left ventricular wall thickness. Normal global wall motion. Normal diastolic filling pattern, normal LAP. Calculated EF 56%. Structurally normal  tricuspid valve with trace regurgitation. No evidence of pulmonary hypertension. Compared to 05/2021, EF has improved from 45-50% to 55-60%.   CCTA 06/18/2021: 1. Total coronary calcium  score of 32.3.  This was 81st percentile for age and sex matched control.   2. Normal coronary origin with right dominance.   3. CAD-RADS = 2 Mild non-obstructive CAD. Left main: Patent. LAD: Minimal (0-24%) mixed plaque within the proximal / mid segment. LCx: Mild stenosis (25-49%) due to noncalcified plaque at mid LCX but accuracy is limited due to artifact. RCA: Mild stenosis (25-49%) due to mixed plaque proximal to mid RCA. 4. Study is sent for CT-FFR to further evaluate the LCX. Findings will be performed and reported separately. 5. No significant incidental findings.   CT-FFR Impression:  1. CT FFR analysis showed no significant stenosis in Left main, LAD, and RCA.  2. Possible hemodynamically significant stenosis within the mid-distal LCx, CT-FFR valves are within the indeterminate zone, clinical correlation required.    Recommendations: Goal directed and uptitration of anti-anginal therapy.  Aggressive risk factor modification for secondary prevention of coronary artery disease.  If symptoms persist despite uptitration of medical therapy consider invasive angiography to further evaluate the LCX.    Heart Catheterization: 07/20/2022 LM: Normal LAD: No significant disease          Diag 2 small to medium caliber. Focal mid 70% stenosis Lcx: Mid long 70% stenosis, RFR 0.94 (physiologically non-significant) RCA: Prox ulcerated 80% stenosis          Mid focal 50% stenosis   Successful OCT guided percutaneous coronary intervention prox RCA     Direct stent placement 4.0 X 16 mm Synergy drug-eluting stent     Post dilatation with 4.5X12 mm [place] balloon ay 16 atm   OCT MLA 3 mm2-->MSA 11 mm2   Risk Assessment/Calculations:   NA   Labs:       Latest Ref Rng & Units 07/21/2022    2:13 AM 07/20/2022    6:44 PM 07/19/2022    7:57 PM  CBC  WBC 4.0 - 10.5 K/uL 7.7  9.2  12.1   Hemoglobin 13.0 - 17.0 g/dL 86.8  85.0  85.1   Hematocrit 39.0 - 52.0 % 38.0  42.9  43.3   Platelets 150 - 400 K/uL  204  232  259        Latest Ref Rng & Units 07/27/2022    4:39 PM 07/21/2022    2:13 AM 07/20/2022    7:51 AM  BMP  Glucose 70 - 99 mg/dL 873  861  827   BUN 6 - 24 mg/dL 27  13  26    Creatinine 0.76 - 1.27 mg/dL 8.75  8.84  8.78   BUN/Creat Ratio 9 - 20 22     Sodium 134 - 144 mmol/L 138  140  138   Potassium 3.5 - 5.2 mmol/L 4.1  3.0  3.0   Chloride 96 - 106 mmol/L 100  102  104   CO2 20 - 29 mmol/L 18  25  24    Calcium  8.7 - 10.2 mg/dL 89.1  9.0  8.7       Latest Ref Rng & Units 07/27/2022    4:39 PM 07/21/2022    2:13 AM 07/20/2022    7:51 AM  CMP  Glucose 70 - 99 mg/dL 873  861  827   BUN 6 - 24 mg/dL 27  13  26    Creatinine 0.76 - 1.27 mg/dL 8.75  8.84  1.21   Sodium 134 - 144 mmol/L 138  140  138   Potassium 3.5 - 5.2 mmol/L 4.1  3.0  3.0   Chloride 96 - 106 mmol/L 100  102  104   CO2 20 - 29 mmol/L 18  25  24    Calcium  8.7 - 10.2 mg/dL 89.1  9.0  8.7     Lab Results  Component Value Date   CHOL 71 (L) 11/10/2023   HDL 30 (L) 11/10/2023   LDLCALC 15 11/10/2023   LDLDIRECT 15 11/10/2023   TRIG 154 (H) 11/10/2023   CHOLHDL 5.6 07/20/2022   No results for input(s): LIPOA in the last 8760 hours.  No components found for: NTPROBNP No results for input(s): PROBNP in the last 8760 hours. No results for input(s): TSH in the last 8760 hours.  External Labs: Collected: 03/18/2021 A1c 6.8  Total cholesterol 182, triglycerides 233, HDL 32, non-HDL 150, LDL 103. Hemoglobin 14.7, hematocrit 43.3% Sodium 140, potassium 3.7, chloride 103, bicarb 27, BUN 9, creatinine 1.4. AST 32, ALT 59, alkaline phosphatase 113. eGFR 59   06/23/2021: Vitamin B12 562   [phone]  Hemoglobin A1C   2021-06-16    Estimated Average Glucose 123      Hemoglobin A1C 5.9   <5.7  Lipid Panel   2021-06-16    Cholesterol 177   <200  Cholesterol / HDL Ratio 5.36   0.00-4.99  HDL Cholesterol 33   >39  LDL Cholesterol (Calculation) SEE COMMENT   <130  LDL/HDL Ratio SEE COMMENT   <3.3   Non-HDL Cholesterol 144   <130  Triglycerides 448   <150       Collected 06/22/2022 at PCP         Hemoglobin A1C 7.7 <5.7 % The following HbA1c ranges recommended by the American Diabetes Association  (ADA) may be used as an aid in the diagnosis of diabetes mellitus.  HA1c Suggested Diagnosis  >=6.5% Diabetic  5.7% - 6.4% Pre-Diabetic  <5.7% Non-Diabetic   Estimated Average Glucose 174   Average Glucose is calculated using the equation AG = (28.7 x HgbA1c) - 46.7  based on the guidelines established by the ADA.   Lipid Panel  Cholesterol 147 <200 mg/dL    Triglycerides 655 <849 mg/dL    HDL Cholesterol 32 >60 mg/dL    Cholesterol / HDL Ratio 4.59 0.00-4.99 Ratio    Non-HDL Cholesterol 115 <130 mg/dL    LDL Cholesterol (Calculation) 46 <130 mg/dL LDL Cholesterol Levels*  Less than 100 mg/dL Optimal  899 to 870 mg/dL Near Optimal/ Above Optimal  130 to 159 mg/dL Borderline High  839 to 189 mg/dL High  809 mg/dL and above Very High  * Categories as recommended by the 2004 ATPIII guidelines   LDL/HDL Ratio 1.4 <3.3 Ratio _________________________________________________________________________  LDL Cholesterol Patient History  _________________________________________________________________________  Test Date: 12/16/2021  LDL Results: 33  Units: mg/dL  % Change: -  -------------------------------------------------------------------------  Test Date: 03/19/2022  LDL Results: 51  Units: mg/dL  % Change: +45%  -------------------------------------------------------------------------  Test Date: 06/18/2022  LDL Results: 46  Units: mg/dL  % Change: -9%  _________________________________________________________________________   Sodium 143 136-145 mEq/L    Potassium 3.5 3.5-5.1 mEq/L    Chloride 102 98-107 mEq/L    CO2 27 21-32 mmol/L    Glucose 195 74-106 mg/dL    BUN 10 2-81 mg/dL    Creatinine 8.92 9.29-8.69 mg/dL    Calcium  9.9 8.5-10.1 mg/dL    Protein 7.7 3.5-1.7  g/dL    Albumin 4.4 6.5-4.9 g/dL    Alkaline Phosphatase 158 25-150 IU/L    ALT (SGPT) 108 <6-78 IU/L    AST (SGOT) 48 0-40 IU/L    Bilirubin, Total 0.9 0.2-1.0 mg/dL    Globulin, Calculated 3.3 1.5-4.6 g/dL    Albumin/Globulin Ratio 1.3 1.1-2.5 mg/dL    BUN/Creatinine Ratio 9.3 11.0-26.0 Ratio    GFR/Black 93 >59 mL/min/1.64m2    GFR/White 80 >59 mL/min/1.74m2 GFR Categories in Chronic Kidney Disease (CKD)  GFR Category GFR (mL/min/1.73 sq. meters) Interpretation  G1 90 or greater Normal or high*  G2 60-89 Mild decrease*  G3a 45-59 Mild to moderate decrease  G3b 30-44 Moderate to severe decrease  G4 15-29 Severe decrease  G5 14 or less Kidney failure  *In the absence of kidney damage, neither GFR category G1 or G2 fulfill  the criteria for CKD (Kidney Int Suppl 2013; 3.1-150)  The CKD-EPI calculation is intended for use in patients 47 years of age and  older. Decreased calculation accuracy may be seen in patients taking  medications that affect renal excretion, or in those patients with extremes in  muscle mass or diet.    External Labs: Collected: September 23, 2022 in [facility]. Total cholesterol 164, triglycerides 358, HDL 28, LDL calculated 78. Hemoglobin A1c 6.3 BUN 26, creatinine 1.58. eGFR 53. Sodium 140, potassium 4.5, chloride 103, bicarb 22. AST 26, ALT 35, alkaline phosphatase 70 Hemoglobin 14.8, hematocrit 46.5  External Labs: Collected: 03/30/2023 [facility] database. BUN26, Cr 1.33 AST, ALT, ALk Phos within normal limits. Total cholesterol 185, triglycerides 345, HDL 32, LDL calculated 95 H&H 15.7/48.8%  Physical Exam:    Today's Vitals   11/17/23 0912  BP: 124/84  Pulse: 85  Resp: 16  SpO2: 94%  Weight: 203 lb 12.8 oz (92.4 kg)  Height: 6' 2 (1.88 m)   Body mass index is 26.17 kg/m. Wt Readings from Last 3 Encounters:  11/17/23 203 lb 12.8 oz (92.4 kg)  05/14/23 232 lb (105.2 kg)  11/19/22 223 lb (101.2 kg)    Physical Exam  Constitutional:  No distress.  Age appropriate, hemodynamically stable.   Neck: No JVD present.  Cardiovascular: Normal rate, regular rhythm, S1 normal, S2 normal, intact distal pulses and normal pulses. Exam reveals no gallop, no S3 and no S4.  No murmur heard. Pulses:      Dorsalis pedis pulses are 2+ on the right side and 2+ on the left side.       Posterior tibial pulses are 2+ on the right side and 2+ on the left side.  Pulmonary/Chest: Effort normal and breath sounds normal. No stridor. He has no wheezes. He has no rales.  Abdominal: Soft. Bowel sounds are normal. He exhibits no distension. There is no abdominal tenderness.  Musculoskeletal:        General: No edema.     Cervical back: Neck supple.  Skin: Skin is warm and moist.     Impression & Recommendation(s):  Impression:   ICD-10-CM   1. Atherosclerosis of native coronary artery of native heart without angina pectoris  I25.10 ECHOCARDIOGRAM COMPLETE    2. Status post coronary artery stent placement  Z95.5     3. Agatston coronary artery calcium  score less than 100  R93.1 ECHOCARDIOGRAM COMPLETE    4. Heart failure with improved ejection fraction (HFimpEF) (HCC)  I50.32     5. Benign hypertension  I10     6. Controlled type 2 diabetes mellitus without complication, without long-term  current use of insulin  (HCC)  E11.9     7. Mixed hyperlipidemia  E78.2     8. Hypertriglyceridemia  E78.1        Recommendation(s):  Atherosclerosis of native coronary artery of native heart without angina pectoris Status post coronary artery stent placement Agatston coronary artery calcium  score less than 100 Denies anginal chest pain. No change in physical endurance. Dual antiplatelet therapy has been transition to aspirin  81 mg p.o. daily. Recommending discontinuation of aspirin  and starting Plavix  75 mg p.o. daily given his premature CAD and coronary interventions and strong family history. LDL levels are very well optimized for the most recent  blood work from August 2025 illustrating an LDL of 15 mg/dL Triglycerides have also improved to 154 mg/dL Recommended reducing the amount of alcohol consumption on a daily basis to no more than 2 standard drinks per day. Echocardiogram in 1 year to reevaluate LVEF-July 2026  Heart failure with improved ejection fraction (HFimpEF) (HCC) Echo February 2023: LVEF of 45-50% Echo May 2024: LVEF 55-60% Stage B, NYHA class I/II Continue spironolactone  25 mg p.o. every morning. Continue Entresto  97/103 mg p.o. twice daily. Continue metoprolol  100 mg p.o. daily. Continue Farxiga  10 mg p.o. every morning. Reemphasized the use of CPAP on a regular basis. Will order an echocardiogram prior to the next office visit, July 2026  Benign hypertension Office blood pressures are very well-controlled. Medications as discussed above  Controlled type 2 diabetes mellitus without complication, without long-term current use of insulin  (HCC) Well-controlled last hemoglobin A1c 5.8 as of June 2025 Currently on ARNI, Farxiga , statin, Repatha   Mixed hyperlipidemia Hypertriglyceridemia Lipids and triglyceride levels have improved significantly when compared to past. Continue Repatha  140 mg every 14 days  Patient endorsed unintentional weight loss of approximately 30 pounds over 6 months.  He has been attributing this to decreased caloric intake given increased stress of losing his father and going through a divorce.  However, advised him to follow-up with PCP to make sure that there are no concerns for underlying malignancy given the degree of unintentional weight loss.  He verbalizes understanding.  Encouraged him to get the pending colonoscopy done.  Patient states that nobody called him back.  I encouraged him to reach out to them.  Overall optimized from a cardiovascular standpoint.  No additional testing warranted at this time.  Like to see him back in 1 year or sooner if needed   Orders Placed:  Orders  Placed This Encounter  Procedures   ECHOCARDIOGRAM COMPLETE    Standing Status:   Future    Expected Date:   10/04/2024    Expiration Date:   01/02/2025    Where [facility] this test [facility] performed[facility]    Does the patient weigh less than or greater than 250 lbs?:   Patient weighs less than 250 lbs    Perflutren DEFINITY (image enhancing agent) should be administered unless hypersensitivity or allergy exist:   Administer Perflutren    Reason for exam-Echo:   CAD Native Vessel  I25.10    Final Medication List:    Meds ordered this encounter  Medications   clopidogrel  (PLAVIX ) 75 MG tablet    Sig: Take 1 tablet (75 mg total) by mouth daily.    Dispense:  90 tablet    Refill:  3    Medications Discontinued During This Encounter  Medication Reason   aspirin  EC 81 MG tablet Change in therapy      Current  Outpatient Medications:    allopurinol  (ZYLOPRIM ) 300 MG tablet, Take 300 mg by mouth daily., Disp: , Rfl:    atorvastatin  (LIPITOR) 80 MG tablet, TAKE 1 TABLET(80 MG) BY MOUTH DAILY, Disp: 90 tablet, Rfl: 2   clopidogrel  (PLAVIX ) 75 MG tablet, Take 1 tablet (75 mg total) by mouth daily., Disp: 90 tablet, Rfl: 3   Evolocumab  (REPATHA  SURECLICK) 140 MG/ML SOAJ, Inject 140 mg into the skin every 14 (fourteen) days., Disp: 6 mL, Rfl: 3   FARXIGA  10 MG TABS tablet, TAKE 1 TABLET(10 MG) BY MOUTH DAILY BEFORE BREAKFAST (Patient taking differently: Take 10 mg by mouth daily.), Disp: 90 tablet, Rfl: 0   fenofibrate  (TRICOR ) 145 MG tablet, TAKE 1 TABLET(145 MG) BY MOUTH DAILY, Disp: 90 tablet, Rfl: 2   metFORMIN  (GLUCOPHAGE -XR) 500 MG 24 hr tablet, Take 2 tablets (1,000 mg total) by mouth 2 (two) times daily with a meal., Disp: , Rfl: 4   metoprolol  succinate (TOPROL -XL) 100 MG 24 hr tablet, TAKE 1 TABLET(100 MG) BY MOUTH TWICE DAILY WITH OR IMMEDIATELY FOLLOWING A MEAL, Disp: 90 tablet, Rfl: 3   Multiple Vitamin (MULTIVITAMIN WITH MINERALS) TABS tablet, Take 1 tablet by mouth daily.,  Disp: , Rfl:    sacubitril -valsartan  (ENTRESTO ) 97-103 MG, TAKE 1 TABLET BY MOUTH TWICE DAILY, Disp: 180 tablet, Rfl: 2   spironolactone  (ALDACTONE ) 25 MG tablet, TAKE 1 TABLET BY MOUTH EVERY MORNING, Disp: 90 tablet, Rfl: 2   venlafaxine XR (EFFEXOR-XR) 75 MG 24 hr capsule, Take 75 mg by mouth daily with breakfast., Disp: , Rfl:   Consent:   NA  Disposition:   1 year follow-up sooner if needed  His questions and concerns were addressed to his satisfaction. He voices understanding of the recommendations provided during this encounter.    Signed, Madonna Michele HAS, [facility]  A Division of [facility] [address], [place], [place] [address]  [place], [place] [address] 11/17/2023 10:05 AM

## 2023-11-17 NOTE — Patient Instructions (Signed)
 Medication Instructions:  STOP Aspirin    START Plavix  75 mg once daily  *If you need a refill on your cardiac medications before your next appointment, please call your pharmacy*  Lab Work: None ordered today. If you have labs (blood work) drawn today and your tests are completely normal, you will receive your results only by: MyChart Message (if you have MyChart) OR A paper copy in the mail If you have any lab test that is abnormal or we need to change your treatment, we will call you to review the results.  Testing/Procedures: Your physician has requested that you have an echocardiogram to be completed in July 2026. Echocardiography is a painless test that uses sound waves to create images of your heart. It provides your doctor with information about the size and shape of your heart and how well your heart's chambers and valves are working. This procedure takes approximately one hour. There are no restrictions for this procedure. Please do NOT wear cologne, perfume, aftershave, or lotions (deodorant is allowed). Please arrive 15 minutes prior to your appointment time.  Please note: We ask at that you not bring children with you during ultrasound (echo/ vascular) testing. Due to room size and safety concerns, children are not allowed in the ultrasound rooms during exams. Our front office staff cannot provide observation of children in our lobby area while testing is being conducted. An adult accompanying a patient to their appointment will only be allowed in the ultrasound room at the discretion of the ultrasound technician under special circumstances. We apologize for any inconvenience.   Follow-Up: At [facility], you and your health needs are our priority.  As part of our continuing mission to provide you with exceptional heart care, our providers are all part of one team.  This team includes your primary Cardiologist (physician) and Advanced Practice Providers or APPs (Physician  Assistants and Nurse Practitioners) who all work together to provide you with the care you need, when you need it.  Your next appointment:   1 year(s) (August 2026)  Provider:   Madonna Large, DO

## 2023-11-25 ENCOUNTER — Telehealth: Payer: Self-pay | Admitting: *Deleted

## 2023-11-25 NOTE — Telephone Encounter (Signed)
   Pre-operative Risk Assessment    Patient Name: Brandon Hogan  DOB: Nov 05, 1970 MRN: [id number]   Date of last office visit: 11/17/23 DR. TOLIA Date of next office visit: NONE   Request for Surgical Clearance    Procedure:  EGD/COLONOSCOPY  Date of Surgery:  Clearance 12/09/23                                Surgeon:  DR. HUNG Surgeon's Group or Practice Name:  [facility] Phone number:  [phone] Fax number:  [phone]   Type of Clearance Requested:   - Medical  - Pharmacy:  Hold Clopidogrel  (Plavix )     Type of Anesthesia:  PROPOFOL    Additional requests/questions:    Bonney Niels Jest   11/25/2023, 10:45 AM

## 2023-11-25 NOTE — Telephone Encounter (Signed)
   Primary Cardiologist: Madonna Large, DO  Chart reviewed as part of pre-operative protocol coverage. Given past medical history and time since last visit, based on ACC/AHA guidelines, Brandon Hogan would be at acceptable risk for the planned procedure without further cardiovascular testing.   Patient should contact our office if he is having new symptoms that are concerning from a cardiac perspective to arrange a follow-up appointment.    Per office protocol, he may hold Plavix  for 5 days prior to procedure and should resume as soon as hemodynamically stable postoperatively.  I will route this recommendation to the requesting party via Epic fax function and remove from pre-op pool.  Please call with questions.  Rosaline EMERSON Bane, NP-C 11/25/2023, 11:11 AM [address], [address] [place], [place] [address] Office [phone] Fax [phone]

## 2024-01-14 ENCOUNTER — Other Ambulatory Visit: Payer: Self-pay | Admitting: Cardiology

## 2024-01-14 DIAGNOSIS — E781 Pure hyperglyceridemia: Secondary | ICD-10-CM

## 2024-01-14 DIAGNOSIS — R931 Abnormal findings on diagnostic imaging of heart and coronary circulation: Secondary | ICD-10-CM

## 2024-01-14 DIAGNOSIS — I25118 Atherosclerotic heart disease of native coronary artery with other forms of angina pectoris: Secondary | ICD-10-CM

## 2024-02-17 ENCOUNTER — Other Ambulatory Visit: Payer: Self-pay | Admitting: Cardiology

## 2024-02-17 DIAGNOSIS — I5042 Chronic combined systolic (congestive) and diastolic (congestive) heart failure: Secondary | ICD-10-CM

## 2024-02-17 DIAGNOSIS — R931 Abnormal findings on diagnostic imaging of heart and coronary circulation: Secondary | ICD-10-CM

## 2024-02-17 DIAGNOSIS — I25118 Atherosclerotic heart disease of native coronary artery with other forms of angina pectoris: Secondary | ICD-10-CM

## 2024-03-05 ENCOUNTER — Encounter: Payer: Self-pay | Admitting: Cardiology

## 2024-03-07 NOTE — Telephone Encounter (Signed)
 Agree, Hold plavix  for 7 days and restart it 24hrs after the completion of tattoos making sure there is no bleeding.   Dr. Maicy Filip

## 2024-03-29 LAB — LAB REPORT - SCANNED
A1c: 5.7
Albumin, Urine POC: 17.2
Creatinine, POC: 184.2 mg/dL
EGFR: 58
Microalb Creat Ratio: 9

## 2024-04-10 ENCOUNTER — Ambulatory Visit: Payer: Self-pay | Admitting: Cardiology

## 2024-04-11 ENCOUNTER — Other Ambulatory Visit: Payer: Self-pay | Admitting: Cardiology

## 2024-05-11 NOTE — Telephone Encounter (Signed)
 Left detailed message per DPR. Relayed Dr. Tyree note. All concerns addressed.

## 2024-05-11 NOTE — Telephone Encounter (Signed)
-----   Message from [place], [place] sent at 04/10/2024  3:29 PM EST ----- Mr. Brandon Hogan Outside labs provided by your primary care were reviewed. LDL levels are quite low, therefore reduce Lipitor to 40 mg p.o. nightly. Continue fenofibrate  and PCSK9 inhibitors These labs are noted for reference and we will review them at the next office visit.    Regards,   Dr. Michele

## 2024-10-16 ENCOUNTER — Other Ambulatory Visit (HOSPITAL_COMMUNITY)
